# Patient Record
Sex: Female | Born: 1935 | Race: Asian | Marital: Married | Smoking: Never smoker
Health system: Southern US, Community
[De-identification: ages and names within clinical notes are randomized; demographics above are authoritative.]

## PROBLEM LIST (undated history)

## (undated) DIAGNOSIS — T7840XA Allergy, unspecified, initial encounter: Secondary | ICD-10-CM

## (undated) DIAGNOSIS — H269 Unspecified cataract: Secondary | ICD-10-CM

## (undated) DIAGNOSIS — H3530 Unspecified macular degeneration: Secondary | ICD-10-CM

## (undated) DIAGNOSIS — Z973 Presence of spectacles and contact lenses: Secondary | ICD-10-CM

## (undated) DIAGNOSIS — E7800 Pure hypercholesterolemia, unspecified: Secondary | ICD-10-CM

## (undated) DIAGNOSIS — R519 Headache, unspecified: Secondary | ICD-10-CM

## (undated) DIAGNOSIS — H5789 Other specified disorders of eye and adnexa: Secondary | ICD-10-CM

## (undated) DIAGNOSIS — E785 Hyperlipidemia, unspecified: Secondary | ICD-10-CM

## (undated) DIAGNOSIS — R51 Headache: Secondary | ICD-10-CM

## (undated) HISTORY — PX: CATARACT EXTRACTION: SUR2

## (undated) HISTORY — DX: Unspecified macular degeneration: H35.30

## (undated) HISTORY — PX: DILATION AND CURETTAGE OF UTERUS: SHX78

## (undated) HISTORY — PX: YAG LASER APPLICATION: SHX6189

## (undated) HISTORY — DX: Unspecified cataract: H26.9

## (undated) HISTORY — PX: EYE SURGERY: SHX253

## (undated) HISTORY — DX: Hyperlipidemia, unspecified: E78.5

## (undated) HISTORY — DX: Headache: R51

---

## 2005-11-24 ENCOUNTER — Ambulatory Visit: Payer: Self-pay | Admitting: Internal Medicine

## 2006-06-12 ENCOUNTER — Ambulatory Visit: Payer: Self-pay | Admitting: Internal Medicine

## 2006-06-25 ENCOUNTER — Ambulatory Visit: Payer: Self-pay | Admitting: Internal Medicine

## 2006-06-29 ENCOUNTER — Encounter: Admission: RE | Admit: 2006-06-29 | Discharge: 2006-06-29 | Payer: Self-pay | Admitting: Internal Medicine

## 2006-07-07 ENCOUNTER — Encounter: Admission: RE | Admit: 2006-07-07 | Discharge: 2006-07-07 | Payer: Self-pay | Admitting: Internal Medicine

## 2006-07-09 ENCOUNTER — Ambulatory Visit: Payer: Self-pay | Admitting: Internal Medicine

## 2006-07-13 ENCOUNTER — Ambulatory Visit: Payer: Self-pay | Admitting: Cardiology

## 2007-01-22 DIAGNOSIS — O039 Complete or unspecified spontaneous abortion without complication: Secondary | ICD-10-CM | POA: Insufficient documentation

## 2007-02-16 ENCOUNTER — Ambulatory Visit: Payer: Self-pay | Admitting: Internal Medicine

## 2007-02-16 LAB — CONVERTED CEMR LAB
ALT: 19 units/L (ref 0–40)
Chloride: 103 meq/L (ref 96–112)
Cholesterol: 200 mg/dL (ref 0–200)
GFR calc Af Amer: 128 mL/min
GFR calc non Af Amer: 106 mL/min
Glucose, Bld: 95 mg/dL (ref 70–99)
HDL: 53 mg/dL (ref >39.0)
LDL Cholesterol: 131 mg/dL — ABNORMAL HIGH (ref 0–99)
Potassium: 3.5 meq/L (ref 3.5–5.1)
Sodium: 139 meq/L (ref 135–145)
Total CHOL/HDL Ratio: 3.8
Triglycerides: 80 mg/dL (ref 0–149)
VLDL: 16 mg/dL (ref 0–40)

## 2007-03-01 DIAGNOSIS — K219 Gastro-esophageal reflux disease without esophagitis: Secondary | ICD-10-CM | POA: Insufficient documentation

## 2007-03-01 DIAGNOSIS — E785 Hyperlipidemia, unspecified: Secondary | ICD-10-CM | POA: Insufficient documentation

## 2007-03-17 ENCOUNTER — Encounter: Payer: Self-pay | Admitting: Internal Medicine

## 2007-03-22 ENCOUNTER — Telehealth: Payer: Self-pay | Admitting: Internal Medicine

## 2011-10-14 HISTORY — PX: EYE SURGERY: SHX253

## 2013-08-01 ENCOUNTER — Ambulatory Visit: Payer: Medicare Other | Admitting: Obstetrics & Gynecology

## 2013-08-01 ENCOUNTER — Encounter: Payer: Self-pay | Admitting: Obstetrics & Gynecology

## 2013-08-01 VITALS — BP 145/90 | Wt 135.0 lb

## 2013-08-01 MED ORDER — ESTROGENS, CONJUGATED 0.625 MG/GM VA CREA
TOPICAL_CREAM | Freq: Every day | VAGINAL | Status: DC
Start: 2013-08-01 — End: 2014-04-20

## 2013-08-01 NOTE — Progress Notes (Signed)
Subjective:      Alicia Massey is a 77 y.o. female  [phone] who presents for annual exam. The patient has no complaints today. The patient is not sexually active. GYN screening history: last pap: was normal. The patient is not taking hormone replacement therapy. Patient denies post-menopausal vaginal bleeding.. The patient wears seatbelts: yes. The patient participates in regular exercise: yes. The patient reports that there is not domestic violence in her life.   Last colonoscopy was 2013.     OB History     Grav Para Term Preterm Abortions TAB SAB Ect Mult Living    3 3 3       3         Gynecologic History  No LMP recorded. Patient is postmenopausal.  Menstrual History:  STD's:    Last Pap: 30 years. Results were: normal  Last mammogram: 30 years ago. Results were: normal    Past Medical History   Diagnosis Date   . Hyperlipidemia    . [id number](914.7)        Past Surgical History   Procedure Date   . Dilation and curettage of uterus        No current outpatient prescriptions on file prior to visit.       No Known Allergies    History     Social History   . Marital Status: Married     Spouse Name: N/A     Number of Children: N/A   . Years of Education: N/A     Occupational History   . Not on file.     Social History Main Topics   . Smoking status: Never Smoker    . Smokeless tobacco: Never Used   . Alcohol Use: No   . Drug Use: No   . Sexually Active: Yes -- Female partner(s)     Birth Control/ Protection: None     Other Topics Concern   . Not on file     Social History Narrative   . No narrative on file       Family History   Problem Relation Age of Onset   . Stroke Father    . Hypertension Father        The following portions of the patient's history were reviewed and updated as appropriate: allergies, current medications, past family history, past medical history, past social history, past surgical history and problem list.    Review of Systems  A comprehensive review of systems was negative.      Objective:      BP  145/90  Wt 61.236 kg (135 lb)          General appearance: alert, appears stated age and cooperative   Back:   No CVA tenderness.   Lungs: clear to auscultation bilaterally and normal percussion bilaterally   Breasts: normal appearance, no masses or tenderness, No nipple discharge or                     bleeding, No axillary or supraclavicular adenopathy, Normal to palpation            without dominant masses   Heart: regular rate and rhythm   Abdomen: normal findings: bowel sounds normal, liver span normal to         percussion, no masses palpable, no organomegaly and soft, non-tender   Pelvic: cervix normal in appearance, external genitalia normal, no adnexal         masses or tenderness, no  cervical motion tenderness, uterus normal        size, shape, and consistency and vagina normal without discharge   Extremities: extremities normal, atraumatic, no cyanosis or edema and Homans        sign is  negative, no sign of DVT    Assessment:      Normal gyn exam  Menopause    atropic vaginitis  Plan:      All questions answered.  Follow up in 1 year.  Mammogram.  Thin prep Pap smear.    Rx premarin cream PRN

## 2013-08-01 NOTE — Addendum Note (Signed)
Addended by: Kristopher Glee on: 08/01/2013 03:31 PM     Modules accepted: Orders

## 2013-08-03 LAB — LIQUID-BASED PAP SMEAR, SCREENING: .: 0

## 2013-08-04 ENCOUNTER — Encounter: Payer: Self-pay | Admitting: Obstetrics & Gynecology

## 2013-11-05 DIAGNOSIS — R310 Gross hematuria: Secondary | ICD-10-CM | POA: Diagnosis not present

## 2013-11-05 DIAGNOSIS — R3129 Other microscopic hematuria: Secondary | ICD-10-CM | POA: Diagnosis not present

## 2013-11-05 DIAGNOSIS — N362 Urethral caruncle: Secondary | ICD-10-CM | POA: Diagnosis not present

## 2014-03-04 ENCOUNTER — Encounter: Payer: Self-pay | Admitting: Obstetrics & Gynecology

## 2014-03-14 DIAGNOSIS — H43399 Other vitreous opacities, unspecified eye: Secondary | ICD-10-CM | POA: Diagnosis not present

## 2014-03-14 DIAGNOSIS — H4011X0 Primary open-angle glaucoma, stage unspecified: Secondary | ICD-10-CM | POA: Diagnosis not present

## 2014-03-14 DIAGNOSIS — H26499 Other secondary cataract, unspecified eye: Secondary | ICD-10-CM | POA: Diagnosis not present

## 2014-04-20 ENCOUNTER — Telehealth: Payer: Self-pay | Admitting: Obstetrics & Gynecology

## 2014-04-20 DIAGNOSIS — R3129 Other microscopic hematuria: Secondary | ICD-10-CM | POA: Diagnosis not present

## 2014-04-20 DIAGNOSIS — N362 Urethral caruncle: Secondary | ICD-10-CM | POA: Diagnosis not present

## 2014-04-20 MED ORDER — ESTROGENS, CONJUGATED 0.625 MG/GM VA CREA
TOPICAL_CREAM | VAGINAL | Status: AC
Start: 2014-04-20 — End: ?

## 2014-04-20 NOTE — Telephone Encounter (Signed)
-----   Message from Angela Nevin sent at 04/20/2014  2:03 PM EDT -----  She lost her prescription of premarin   [facility] [email or web address]

## 2014-12-26 DIAGNOSIS — R319 Hematuria, unspecified: Secondary | ICD-10-CM | POA: Diagnosis not present

## 2014-12-26 DIAGNOSIS — E780 Pure hypercholesterolemia: Secondary | ICD-10-CM | POA: Diagnosis not present

## 2014-12-26 DIAGNOSIS — Z0000 Encounter for general adult medical examination without abnormal findings: Secondary | ICD-10-CM | POA: Diagnosis not present

## 2014-12-26 DIAGNOSIS — K210 Gastro-esophageal reflux disease with esophagitis: Secondary | ICD-10-CM | POA: Diagnosis not present

## 2014-12-26 DIAGNOSIS — R634 Abnormal weight loss: Secondary | ICD-10-CM | POA: Diagnosis not present

## 2014-12-28 DIAGNOSIS — K769 Liver disease, unspecified: Secondary | ICD-10-CM | POA: Diagnosis not present

## 2014-12-28 DIAGNOSIS — H3500 Unspecified background retinopathy: Secondary | ICD-10-CM | POA: Diagnosis not present

## 2014-12-28 DIAGNOSIS — R109 Unspecified abdominal pain: Secondary | ICD-10-CM | POA: Diagnosis not present

## 2014-12-28 DIAGNOSIS — M810 Age-related osteoporosis without current pathological fracture: Secondary | ICD-10-CM | POA: Diagnosis not present

## 2014-12-28 DIAGNOSIS — H4010X2 Unspecified open-angle glaucoma, moderate stage: Secondary | ICD-10-CM | POA: Diagnosis not present

## 2014-12-29 DIAGNOSIS — R1013 Epigastric pain: Secondary | ICD-10-CM | POA: Diagnosis not present

## 2014-12-29 DIAGNOSIS — K2970 Gastritis, unspecified, without bleeding: Secondary | ICD-10-CM | POA: Diagnosis not present

## 2014-12-29 DIAGNOSIS — K225 Diverticulum of esophagus, acquired: Secondary | ICD-10-CM | POA: Diagnosis not present

## 2014-12-29 DIAGNOSIS — K2950 Unspecified chronic gastritis without bleeding: Secondary | ICD-10-CM | POA: Diagnosis not present

## 2014-12-29 DIAGNOSIS — K2940 Chronic atrophic gastritis without bleeding: Secondary | ICD-10-CM | POA: Diagnosis not present

## 2014-12-29 DIAGNOSIS — R1033 Periumbilical pain: Secondary | ICD-10-CM | POA: Diagnosis not present

## 2015-01-10 DIAGNOSIS — E780 Pure hypercholesterolemia: Secondary | ICD-10-CM | POA: Diagnosis not present

## 2015-01-10 DIAGNOSIS — K210 Gastro-esophageal reflux disease with esophagitis: Secondary | ICD-10-CM | POA: Diagnosis not present

## 2015-01-10 DIAGNOSIS — R634 Abnormal weight loss: Secondary | ICD-10-CM | POA: Diagnosis not present

## 2015-02-13 DIAGNOSIS — K210 Gastro-esophageal reflux disease with esophagitis: Secondary | ICD-10-CM | POA: Diagnosis not present

## 2015-02-13 DIAGNOSIS — H9311 Tinnitus, right ear: Secondary | ICD-10-CM | POA: Diagnosis not present

## 2015-02-13 DIAGNOSIS — J309 Allergic rhinitis, unspecified: Secondary | ICD-10-CM | POA: Diagnosis not present

## 2015-07-03 DIAGNOSIS — R21 Rash and other nonspecific skin eruption: Secondary | ICD-10-CM | POA: Diagnosis not present

## 2015-07-03 DIAGNOSIS — E785 Hyperlipidemia, unspecified: Secondary | ICD-10-CM | POA: Diagnosis not present

## 2015-07-03 DIAGNOSIS — Z0000 Encounter for general adult medical examination without abnormal findings: Secondary | ICD-10-CM | POA: Diagnosis not present

## 2015-07-03 DIAGNOSIS — M810 Age-related osteoporosis without current pathological fracture: Secondary | ICD-10-CM | POA: Diagnosis not present

## 2015-07-04 DIAGNOSIS — R21 Rash and other nonspecific skin eruption: Secondary | ICD-10-CM | POA: Diagnosis not present

## 2015-07-31 DIAGNOSIS — N390 Urinary tract infection, site not specified: Secondary | ICD-10-CM | POA: Diagnosis not present

## 2015-07-31 DIAGNOSIS — M810 Age-related osteoporosis without current pathological fracture: Secondary | ICD-10-CM | POA: Diagnosis not present

## 2015-07-31 DIAGNOSIS — E559 Vitamin D deficiency, unspecified: Secondary | ICD-10-CM | POA: Diagnosis not present

## 2015-07-31 DIAGNOSIS — Z79899 Other long term (current) drug therapy: Secondary | ICD-10-CM | POA: Diagnosis not present

## 2015-07-31 DIAGNOSIS — E785 Hyperlipidemia, unspecified: Secondary | ICD-10-CM | POA: Diagnosis not present

## 2015-07-31 DIAGNOSIS — E539 Vitamin B deficiency, unspecified: Secondary | ICD-10-CM | POA: Diagnosis not present

## 2015-08-03 DIAGNOSIS — M810 Age-related osteoporosis without current pathological fracture: Secondary | ICD-10-CM | POA: Diagnosis not present

## 2015-08-03 DIAGNOSIS — E785 Hyperlipidemia, unspecified: Secondary | ICD-10-CM | POA: Diagnosis not present

## 2015-08-03 DIAGNOSIS — Z0000 Encounter for general adult medical examination without abnormal findings: Secondary | ICD-10-CM | POA: Diagnosis not present

## 2015-11-26 DIAGNOSIS — E7800 Pure hypercholesterolemia, unspecified: Secondary | ICD-10-CM | POA: Diagnosis not present

## 2015-11-26 DIAGNOSIS — K210 Gastro-esophageal reflux disease with esophagitis: Secondary | ICD-10-CM | POA: Diagnosis not present

## 2015-11-26 DIAGNOSIS — R319 Hematuria, unspecified: Secondary | ICD-10-CM | POA: Diagnosis not present

## 2015-12-13 DIAGNOSIS — N3281 Overactive bladder: Secondary | ICD-10-CM | POA: Diagnosis not present

## 2015-12-13 DIAGNOSIS — N390 Urinary tract infection, site not specified: Secondary | ICD-10-CM | POA: Diagnosis not present

## 2015-12-13 DIAGNOSIS — R319 Hematuria, unspecified: Secondary | ICD-10-CM | POA: Diagnosis not present

## 2016-05-06 DIAGNOSIS — M810 Age-related osteoporosis without current pathological fracture: Secondary | ICD-10-CM | POA: Diagnosis not present

## 2016-05-06 DIAGNOSIS — K210 Gastro-esophageal reflux disease with esophagitis: Secondary | ICD-10-CM | POA: Diagnosis not present

## 2016-05-06 DIAGNOSIS — H04121 Dry eye syndrome of right lacrimal gland: Secondary | ICD-10-CM | POA: Diagnosis not present

## 2016-05-06 DIAGNOSIS — E7800 Pure hypercholesterolemia, unspecified: Secondary | ICD-10-CM | POA: Diagnosis not present

## 2016-05-06 DIAGNOSIS — H401132 Primary open-angle glaucoma, bilateral, moderate stage: Secondary | ICD-10-CM | POA: Diagnosis not present

## 2016-05-22 DIAGNOSIS — H04122 Dry eye syndrome of left lacrimal gland: Secondary | ICD-10-CM | POA: Diagnosis not present

## 2016-07-27 ENCOUNTER — Encounter: Payer: Self-pay | Admitting: *Deleted

## 2016-10-24 DIAGNOSIS — H9311 Tinnitus, right ear: Secondary | ICD-10-CM | POA: Diagnosis not present

## 2016-10-24 DIAGNOSIS — E785 Hyperlipidemia, unspecified: Secondary | ICD-10-CM | POA: Diagnosis not present

## 2016-10-24 DIAGNOSIS — R51 Headache: Secondary | ICD-10-CM | POA: Diagnosis not present

## 2016-10-24 DIAGNOSIS — Z79899 Other long term (current) drug therapy: Secondary | ICD-10-CM | POA: Diagnosis not present

## 2016-10-24 DIAGNOSIS — Z6822 Body mass index (BMI) 22.0-22.9, adult: Secondary | ICD-10-CM | POA: Diagnosis not present

## 2016-10-24 DIAGNOSIS — Z0000 Encounter for general adult medical examination without abnormal findings: Secondary | ICD-10-CM | POA: Diagnosis not present

## 2017-04-25 DIAGNOSIS — H40013 Open angle with borderline findings, low risk, bilateral: Secondary | ICD-10-CM | POA: Diagnosis not present

## 2017-04-27 DIAGNOSIS — H40013 Open angle with borderline findings, low risk, bilateral: Secondary | ICD-10-CM | POA: Diagnosis not present

## 2017-04-27 DIAGNOSIS — K210 Gastro-esophageal reflux disease with esophagitis: Secondary | ICD-10-CM | POA: Diagnosis not present

## 2017-04-27 DIAGNOSIS — E7800 Pure hypercholesterolemia, unspecified: Secondary | ICD-10-CM | POA: Diagnosis not present

## 2017-04-27 DIAGNOSIS — M810 Age-related osteoporosis without current pathological fracture: Secondary | ICD-10-CM | POA: Diagnosis not present

## 2017-04-30 DIAGNOSIS — K2970 Gastritis, unspecified, without bleeding: Secondary | ICD-10-CM | POA: Diagnosis not present

## 2017-04-30 DIAGNOSIS — K2950 Unspecified chronic gastritis without bleeding: Secondary | ICD-10-CM | POA: Diagnosis not present

## 2017-04-30 DIAGNOSIS — K2940 Chronic atrophic gastritis without bleeding: Secondary | ICD-10-CM | POA: Diagnosis not present

## 2017-04-30 DIAGNOSIS — M810 Age-related osteoporosis without current pathological fracture: Secondary | ICD-10-CM | POA: Diagnosis not present

## 2017-04-30 DIAGNOSIS — K3189 Other diseases of stomach and duodenum: Secondary | ICD-10-CM | POA: Diagnosis not present

## 2017-07-04 ENCOUNTER — Encounter: Payer: Self-pay | Admitting: *Deleted

## 2017-07-04 DIAGNOSIS — Z23 Encounter for immunization: Secondary | ICD-10-CM

## 2017-07-04 NOTE — Congregational Nurse Program (Unsigned)
Congregational Nurse Program Note  Date of Encounter: 07/04/2017  Past Medical History: No past medical history on file.  Encounter Details:     CNP Questionnaire - 07/04/17 0847      Patient Demographics   Is this a new or existing patient? New   Patient is considered a/an Immigrant   Race Asian     Patient Assistance   Location of Patient Assistance Not Applicable   Patient's financial/insurance status Medicare   Uninsured Patient ([employer] trade [employer]) No   Patient referred to apply for the following financial assistance Not Applicable   Food insecurities addressed Not Applicable   Transportation assistance No   Assistance securing medications No   Educational health offerings Nutrition;Spiritual care     Encounter Details   Primary purpose of visit Navigating the Healthcare System  CAME FOR FLU SHOT, THEN C/O HEARTBURN   Was an Emergency Department visit averted? Not Applicable   Does patient have a medical provider? Yes   Patient referred to --  FAMILY DR   Was a mental health screening completed? (GAINS tool) No   Does patient have dental issues? No   Does patient have vision issues? No   Does your patient have an abnormal blood pressure today? No   Since previous encounter, have you referred patient for abnormal blood pressure that resulted in a new diagnosis or medication change? No   Does your patient have an abnormal blood glucose today? No   Since previous encounter, have you referred patient for abnormal blood glucose that resulted in a new diagnosis or medication change? No   Was there a life-saving intervention made? No     pt. Came for flu shot clinic, c/o heartburn. She has primary Dr. Ernie Avena to check with her Dr.

## 2017-09-01 DIAGNOSIS — H26491 Other secondary cataract, right eye: Secondary | ICD-10-CM | POA: Diagnosis not present

## 2017-09-01 DIAGNOSIS — H26492 Other secondary cataract, left eye: Secondary | ICD-10-CM | POA: Diagnosis not present

## 2017-09-01 DIAGNOSIS — H353211 Exudative age-related macular degeneration, right eye, with active choroidal neovascularization: Secondary | ICD-10-CM | POA: Diagnosis not present

## 2017-09-01 DIAGNOSIS — H353123 Nonexudative age-related macular degeneration, left eye, advanced atrophic without subfoveal involvement: Secondary | ICD-10-CM | POA: Diagnosis not present

## 2017-09-11 NOTE — Progress Notes (Signed)
[facility] - Clinic Note  09/14/2017     CHIEF COMPLAINT Patient presents for Retina Evaluation   HISTORY OF PRESENT ILLNESS: Lindsay Harvey is a 81 y.o. female who presents to the clinic today for:   HPI    Retina Evaluation    In both eyes.  Associated Symptoms Photophobia and Pain.  Negative for Flashes, Trauma, Fever, Floaters, Redness, Scalp Tenderness, Weight Loss, Fatigue, Jaw Claudication, Distortion, Blind Spot, Glare and Shoulder/Hip pain.  Context:  distance vision, mid-range vision and near vision.  Treatments tried include eye drops.  Response to treatment was no improvement.  I, the attending physician,  performed the HPI with the patient and updated documentation appropriately.          Comments    Referral Dr. Elmer PickerHecker for AMD Genella RifeEval Ou. Patient states eyes feel dry and irrigating after Cat sx. 2013. Right eye has occasional  Pain.  Patient wears sun glasses due to photosensitivity  Pt use refresh QID/PRN. Takes AREDs2 qd. Interpretor S. Zoila Shutterosenberg and daughter at patient side        Last edited by Rennis ChrisZamora, Cassity Christian, MD on 09/14/2017  4:05 PM. (History)      Referring physician: Dimitri PedWeaver, Christopher D, MD [address] [place], [place] [id number]  HISTORICAL INFORMATION:   Selected notes from the MEDICAL RECORD NUMBER Referred by Dr. Marcha Solders. [name] for concern of AMD OU, CNV;  LEE- 11.20.18 (C. [name]) [BCVA OD: 20/40 OS 20/30] Ocular Hx- DES OU, pseudophakia OU, keratoconjunctivitis sicca OU PMH- high chol.    CURRENT MEDICATIONS: Current Outpatient Medications (Ophthalmic Drugs)  Medication Sig  . carboxymethylcellulose (REFRESH PLUS) 0.5 % SOLN 1 drop 3 (three) times daily as needed.   No current facility-administered medications for this visit.  (Ophthalmic Drugs)   Current Outpatient Medications (Other)  Medication Sig  . calcium-vitamin D 250-100 MG-UNIT tablet Take 1 tablet by mouth 2 (two) times daily.  . Multiple Vitamins-Minerals  (PRESERVISION AREDS 2 PO) Take by mouth.  . simvastatin (ZOCOR) 40 MG tablet Take 40 mg by mouth daily.   Current Facility-Administered Medications (Other)  Medication Route  . Bevacizumab (AVASTIN) SOLN 1.25 mg Intravitreal      REVIEW OF SYSTEMS: ROS    Positive for: Eyes   Negative for: Constitutional, Gastrointestinal, Neurological, Skin, Genitourinary, Musculoskeletal, HENT, Endocrine, Cardiovascular, Respiratory, Psychiatric, Allergic/Imm, Heme/Lymph   Last edited by Eldridge ScotKendrick, Glenda, LPN on 60/4/540912/12/2016  2:34 PM. (History)    Through interp pt states that she was seen by Dr. Alben SpittleWeaver last month, pt states that she noticed trouble with her vision years ago, states that she was looking for a white paper and noticed the paper was red; Pt states that she no longer sees red out of OU; Pt reports that when she closes OU she sees a type of floater; Pt feels that OU VA has been getting worse x July; Pt reports that she has never been told that she has macular degeneration;    ALLERGIES No Known Allergies  PAST MEDICAL HISTORY Past Medical History:  Diagnosis Date  . Cataract 2013   OU   Past Surgical History:  Procedure Laterality Date  . CATARACT EXTRACTION     2013  . DILATION AND CURETTAGE OF UTERUS    . EYE SURGERY      FAMILY HISTORY Family History  Problem Relation Age of Onset  . Stroke Father     SOCIAL HISTORY Social History   Tobacco Use  .  Smoking status: Never Smoker  . Smokeless tobacco: Never Used  Substance Use Topics  . Alcohol use: No    Frequency: Never  . Drug use: No         OPHTHALMIC EXAM:  Base Eye Exam    Visual Acuity (Snellen - Linear)      Right Left   Dist cc 20/60 20/30   Dist ph cc 20/40 20/30   Correction:  Glasses       Tonometry (Tonopen, 3:13 PM)      Right Left   Pressure 13 17       Pupils      Dark Shape APD   Right 3.5 Round None   Left 3.5 Round None       Visual Fields (Counting fingers)      Left Right     Full Full       Extraocular Movement      Right Left    Full, Ortho Full, Ortho       Neuro/Psych    Oriented x3:  Yes   Mood/Affect:  Normal       Dilation    Both eyes:  1.0% Mydriacyl, 2.5% Phenylephrine @ 3:13 PM        Slit Lamp and Fundus Exam    Slit Lamp Exam      Right Left   Lids/Lashes Dermatochalasis - upper lid Dermatochalasis - upper lid   Conjunctiva/Sclera White and quiet White and quiet   Cornea Arcus, otherwise clear Arcus   Anterior Chamber Deep and quiet Deep and quiet   Iris Round and dilated Round and dilated   Lens PC IOL in good postion, with anterior capsul fimosis, , 1+ Posterior capsular opacification PC IOL in good postion, with anterior capsul fimosis, , 2+ Posterior capsular opacification   Vitreous syneresis Vitreous syneresis       Fundus Exam      Right Left   Disc Normal sharp rim, cupping   C/D Ratio 0.5 0.6   Macula + Edema, + heme, drusen, mild ERM, hemorrhage inferotemporal to fovea Flat, Drusen, early Atrophy, RPE mottling and clumping   Vessels Vascular attenuation Vascular attenuation   Periphery Attached, scattered peripheral drusen Attached with peripheral drusen        Refraction    Wearing Rx      Sphere Cylinder Axis Add   Right -1.00 +0.75 180 +2.75   Left -0.50 +0.75 004 +2.75   Age:  12 years       Manifest Refraction      Sphere Cylinder Axis Dist VA   Right [place] +1.00 180 20/40   Left -0.50 +1.50 006 20/30          IMAGING AND PROCEDURES  Imaging and Procedures for 09/15/17  OCT, Retina - OU - Both Eyes     Right Eye Quality was good. Central Foveal Thickness: 339. Progression has no prior data. Findings include abnormal foveal contour, retinal drusen , subretinal fluid, intraretinal fluid, pigment epithelial detachment, vitreomacular adhesion , vitreous traction.   Left Eye Quality was good. Central Foveal Thickness: 272. Progression has no prior data. Findings include vitreomacular adhesion ,  vitreous traction, pigment epithelial detachment, abnormal foveal contour, no IRF, no SRF.   Notes Images taken, stored on drive  Diagnosis / Impression:  Exudative ARMD OD Nonexudative ARMD OS VMT OU  Clinical management:  See below  Abbreviations: NFP - Normal foveal profile. CME - cystoid macular edema. PED - pigment epithelial  detachment. IRF - intraretinal fluid. SRF - subretinal fluid. EZ - ellipsoid zone. ERM - epiretinal membrane. ORA - outer retinal atrophy. ORT - outer retinal tubulation. SRHM - subretinal hyper-reflective material         Intravitreal Injection, Pharmacologic Agent - OD - Right Eye     Time Out 09/14/2017. 4:52 PM. Confirmed correct patient, procedure, site, and patient consented.   Anesthesia Topical anesthesia was used. Anesthetic medications included Lidocaine 2%, Tetracaine 0.5%.   Procedure Preparation included 5% betadine to ocular surface, eyelid speculum. A supplied needle was used.   Injection: 1.25 mg Bevacizumab 1.25mg /0.2505ml   NDC: 16109-604-5450242-060-01    Lot: [id number]     Expiration Date: 10/21/2017   Route: Intravitreal   Site: Right Eye   Waste: 0 mg  Post-op Post injection exam found visual acuity of at least counting fingers. The patient tolerated the procedure well. There were no complications. The patient received written and verbal post procedure care education.                 ASSESSMENT/PLAN:    ICD-10-CM   1. Exudative age-related macular degeneration of right eye with active choroidal neovascularization (HCC) H35.3211 OCT, Retina - OU - Both Eyes    Intravitreal Injection, Pharmacologic Agent - OD - Right Eye    Bevacizumab (AVASTIN) SOLN 1.25 mg  2. 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 Pseudophakia of both eyes Z96.1     1. 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    - The incidence pathology and anatomy of wet AMD  discussed   - The ANCHOR, MARINA, CATT and VIEW trials discussed with patient.    - discussed treatment options including observation vs intravitreal anti-VEGF agents such as Avastin, Lucentis, Eylea.    - Risks of endophthalmitis and vascular occlusive events and atrophic changes discussed with patient  - intraretinal heme noted on exam  - OCT shows extensive PED with overlying IRF and SRF  - interestingly, VA still pretty good at 20/60 OD  - active CNVM -- recommend IVA OD #1 today, 12.3.18  - pt wishes to be treated with IVA  - RBA of procedure discussed, questions answered  - informed consent obtained and signed  - see procedure note  - f/u in 4 wks  2. Age related macular degeneration, non-exudative, OS  - The incidence, anatomy, and pathology of dry AMD, risk of progression, and the AREDS and AREDS 2 study including smoking risks discussed with patient.  - Recommend amsler grid monitoring  - will continue to monitor for conversion to exudative ARMD  3. Pseudophakia OU  - s/p CE/IOL OU  - beautiful surgery, doing well  - monitor   Ophthalmic Meds Ordered this visit:  Meds ordered this encounter  Medications  . Bevacizumab (AVASTIN) SOLN 1.25 mg       Return in about 4 weeks (around 10/12/2017) for Dilated Exam, OCT, Possible Injxn.  There are no Patient Instructions on file for this visit.   Explained the diagnoses, plan, and follow up with the patient and they expressed understanding.  Patient expressed understanding of the importance of proper follow up care.    Karie ChimeraBrian G. Admir Candelas, M.D., Ph.D. Diseases & Surgery of the Retina and Vitreous [facility] 09/15/17     Abbreviations: M myopia (nearsighted); A astigmatism; H hyperopia (farsighted); P presbyopia; Mrx spectacle prescription;  CTL contact lenses; OD right eye; OS left  eye; OU both eyes  XT exotropia; ET esotropia; PEK punctate epithelial keratitis; PEE punctate epithelial erosions;  DES dry eye syndrome; MGD meibomian gland dysfunction; ATs artificial tears; PFAT's preservative free artificial tears; NSC nuclear sclerotic cataract; PSC posterior subcapsular cataract; ERM epi-retinal membrane; PVD posterior vitreous detachment; RD retinal detachment; DM diabetes mellitus; DR diabetic retinopathy; NPDR non-proliferative diabetic retinopathy; PDR proliferative diabetic retinopathy; CSME clinically significant macular edema; DME diabetic macular edema; dbh dot blot hemorrhages; CWS cotton wool spot; POAG primary open angle glaucoma; C/D cup-to-disc ratio; HVF humphrey visual field; GVF goldmann visual field; OCT optical coherence tomography; IOP intraocular pressure; BRVO Branch retinal vein occlusion; CRVO central retinal vein occlusion; CRAO central retinal artery occlusion; BRAO branch retinal artery occlusion; RT retinal tear; SB scleral buckle; PPV pars plana vitrectomy; VH Vitreous hemorrhage; PRP panretinal laser photocoagulation; IVK intravitreal kenalog; VMT vitreomacular traction; MH Macular hole;  NVD neovascularization of the disc; NVE neovascularization elsewhere; AREDS age related eye disease study; ARMD age related macular degeneration; POAG primary open angle glaucoma; EBMD epithelial/anterior basement membrane dystrophy; ACIOL anterior chamber intraocular lens; IOL intraocular lens; PCIOL posterior chamber intraocular lens; Phaco/IOL phacoemulsification with intraocular lens placement; PRK photorefractive keratectomy; LASIK laser assisted in situ keratomileusis; HTN hypertension; DM diabetes mellitus; COPD chronic obstructive pulmonary disease

## 2017-09-14 ENCOUNTER — Encounter (INDEPENDENT_AMBULATORY_CARE_PROVIDER_SITE_OTHER): Payer: Self-pay | Admitting: Ophthalmology

## 2017-09-14 ENCOUNTER — Ambulatory Visit (INDEPENDENT_AMBULATORY_CARE_PROVIDER_SITE_OTHER): Payer: Medicare HMO | Admitting: Ophthalmology

## 2017-09-14 DIAGNOSIS — Z961 Presence of intraocular lens: Secondary | ICD-10-CM

## 2017-09-14 DIAGNOSIS — H353121 Nonexudative age-related macular degeneration, left eye, early dry stage: Secondary | ICD-10-CM | POA: Diagnosis not present

## 2017-09-14 DIAGNOSIS — H353211 Exudative age-related macular degeneration, right eye, with active choroidal neovascularization: Secondary | ICD-10-CM | POA: Diagnosis not present

## 2017-09-15 ENCOUNTER — Encounter (INDEPENDENT_AMBULATORY_CARE_PROVIDER_SITE_OTHER): Payer: Self-pay | Admitting: Ophthalmology

## 2017-09-15 DIAGNOSIS — H353211 Exudative age-related macular degeneration, right eye, with active choroidal neovascularization: Secondary | ICD-10-CM | POA: Diagnosis not present

## 2017-09-15 DIAGNOSIS — Z961 Presence of intraocular lens: Secondary | ICD-10-CM | POA: Diagnosis not present

## 2017-09-15 DIAGNOSIS — H353121 Nonexudative age-related macular degeneration, left eye, early dry stage: Secondary | ICD-10-CM | POA: Diagnosis not present

## 2017-09-15 MED ORDER — BEVACIZUMAB CHEMO INJECTION 1.25MG/0.05ML SYRINGE FOR KALEIDOSCOPE
1.2500 mg | INTRAVITREAL | Status: AC
  Administered 2017-09-15: 1.25 mg via INTRAVITREAL

## 2017-10-01 NOTE — Progress Notes (Signed)
[facility] - Clinic Note  10/14/2017     CHIEF COMPLAINT Patient presents for Retina Follow Up   HISTORY OF PRESENT ILLNESS: Lindsay Harvey is a 81 y.o. female who presents to the clinic today for:   HPI    Retina Follow Up    Patient presents with  Wet AMD.  In both eyes.  Severity is moderate.  Duration of 4 weeks.  I, the attending physician,  performed the HPI with the patient and updated documentation appropriately.          Comments    Pt presents today for F/U of  EXU ARMD OD and Non-exu ARMD OS, pt reports eyes are dry and hurt all the time, says vision is stable, denies flashes, floaters and wavy vision; pt states she is using Refresh eye drops;       Last edited by Rennis Chris, MD on 10/14/2017  8:21 AM. (History)    Pt states that she has not noticed any change in [place] since having injection; Pt state she continues to have dryness; Pt reports she is using AT OU prn; Pt reports that she is ready tohave another injection today if needed;   Referring physician: Pearson Grippe, MD [address] [place], [place] [address]  HISTORICAL INFORMATION:   Selected notes from the MEDICAL RECORD NUMBER Referred by Dr. Marcha Solders for concern of AMD OU, CNV;  LEE- 11.20.18 (C. [name]) [BCVA OD: 20/40 OS 20/30] Ocular Hx- DES OU, pseudophakia OU, keratoconjunctivitis sicca OU PMH- high chol.    CURRENT MEDICATIONS: Current Outpatient Medications (Ophthalmic Drugs)  Medication Sig  . carboxymethylcellulose (REFRESH PLUS) 0.5 % SOLN 1 drop 3 (three) times daily as needed.   No current facility-administered medications for this visit.  (Ophthalmic Drugs)   Current Outpatient Medications (Other)  Medication Sig  . calcium-vitamin D 250-100 MG-UNIT tablet Take 1 tablet by mouth 2 (two) times daily.  . Multiple Vitamins-Minerals (PRESERVISION AREDS 2 PO) Take by mouth.  . simvastatin (ZOCOR) 40 MG tablet Take 40 mg by mouth daily.   Current  Facility-Administered Medications (Other)  Medication Route  . Bevacizumab (AVASTIN) SOLN 1.25 mg Intravitreal  . Bevacizumab (AVASTIN) SOLN 1.25 mg Intravitreal      REVIEW OF SYSTEMS: ROS    Positive for: Eyes   Negative for: Constitutional, Gastrointestinal, Neurological, Skin, Genitourinary, Musculoskeletal, HENT, Endocrine, Cardiovascular, Respiratory, Psychiatric, Allergic/Imm, Heme/Lymph   Last edited by Posey Boyer, COT on 10/14/2017  8:15 AM. (History)    Through interp pt states    ALLERGIES No Known Allergies  PAST MEDICAL HISTORY Past Medical History:  Diagnosis Date  . Cataract 2013   OU   Past Surgical History:  Procedure Laterality Date  . CATARACT EXTRACTION     2013  . DILATION AND CURETTAGE OF UTERUS    . EYE SURGERY      FAMILY HISTORY Family History  Problem Relation Age of Onset  . Stroke Father     SOCIAL HISTORY Social History   Tobacco Use  . Smoking status: Never Smoker  . Smokeless tobacco: Never Used  Substance Use Topics  . Alcohol use: No    Frequency: Never  . Drug use: No         OPHTHALMIC EXAM:  Base Eye Exam    Visual Acuity (Snellen - Linear)      Right Left   Dist cc 20/50 20/30 -1   Dist ph cc NI NI  Tonometry (Tonopen, 8:27 AM)      Right Left   Pressure 14 10       Pupils      Dark Light Shape React APD   Right 3.5 3 Round Slow None   Left 3.5 3 Round Slow None       Visual Fields (Counting fingers)      Left Right    Full Full       Extraocular Movement      Right Left    Full, Ortho Full, Ortho       Neuro/Psych    Oriented x3:  Yes   Mood/Affect:  Normal       Dilation    Both eyes:  1.0% Mydriacyl, 2.5% Phenylephrine @ 8:27 AM        Slit Lamp and Fundus Exam    Slit Lamp Exam      Right Left   Lids/Lashes Dermatochalasis - upper lid Dermatochalasis - upper lid   Conjunctiva/Sclera White and quiet White and quiet   Cornea Arcus, otherwise clear, 1+ Punctate epithelial  erosions centrally Arcus   Anterior Chamber Deep and quiet Deep and quiet   Iris Round and dilated Round and dilated   Lens PC IOL in good postion with anterior capsular phimosis, 1+ Posterior capsular opacification PC IOL in good postion with anterior capsular phimosis, 2+ Posterior capsular opacification   Vitreous syneresis Vitreous syneresis       Fundus Exam      Right Left   Disc Normal sharp rim, cupping   C/D Ratio 0.5 0.6   Macula Edema improved, + heme, mild ERM, hemorrhage inferotemporal to fovea, Hard drusen, Soft drusen Flat, Drusen, early Atrophy, RPE mottling and clumping, +PEDs, No heme   Vessels Vascular attenuation Vascular attenuation   Periphery Attached, scattered peripheral drusen Attached with peripheral drusen          IMAGING AND PROCEDURES  Imaging and Procedures for 10/14/17  OCT, Retina - OU - Both Eyes     Right Eye Quality was good. Central Foveal Thickness: 242. Progression has improved. Findings include abnormal foveal contour, retinal drusen , intraretinal fluid, pigment epithelial detachment, vitreomacular adhesion , vitreous traction.   Left Eye Quality was good. Central Foveal Thickness: 277. Progression has been stable. Findings include vitreomacular adhesion , vitreous traction, pigment epithelial detachment, abnormal foveal contour, no IRF, no SRF.   Notes Images taken, stored on drive  Diagnosis / Impression:  Exudative ARMD OD -- interval improvement in PED, SRF, IRF Nonexudative ARMD OS VMT OU  Clinical management:  See below  Abbreviations: NFP - Normal foveal profile. CME - cystoid macular edema. PED - pigment epithelial detachment. IRF - intraretinal fluid. SRF - subretinal fluid. EZ - ellipsoid zone. ERM - epiretinal membrane. ORA - outer retinal atrophy. ORT - outer retinal tubulation. SRHM - subretinal hyper-reflective material         Intravitreal Injection, Pharmacologic Agent - OD - Right Eye     Time Out 10/14/2017.  9:23 AM. Confirmed correct patient, procedure, site, and patient consented.   Anesthesia Topical anesthesia was used. Anesthetic medications included Lidocaine 2%, Tetracaine 0.5%.   Procedure Preparation included 5% betadine to ocular surface, 10% betadine to eyelids. A supplied needle was used.   Injection: 1.25 mg Bevacizumab 1.25mg /0.8705ml   NDC: 40981-191-4750242-060-01    Lot: [id number]    Expiration Date: 11/15/2017   Route: Intravitreal   Site: Right Eye   Waste: 0 mg  Post-op Post injection  exam found visual acuity of at least counting fingers. The patient tolerated the procedure well. There were no complications. The patient received written and verbal post procedure care education.                 ASSESSMENT/PLAN:    ICD-10-CM   1. Exudative age-related macular degeneration of right eye with active choroidal neovascularization (HCC) H35.3211 OCT, Retina - OU - Both Eyes    Intravitreal Injection, Pharmacologic Agent - OD - Right Eye    Bevacizumab (AVASTIN) SOLN 1.25 mg  2. 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 Pseudophakia of both eyes Z96.1     1. 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    - The incidence pathology and anatomy of wet AMD discussed   - The ANCHOR, MARINA, CATT and VIEW trials discussed with patient.    - discussed treatment options including observation vs intravitreal anti-VEGF agents such as Avastin, Lucentis, Eylea.    - Risks of endophthalmitis and vascular occlusive events and atrophic changes discussed with patient  - intraretinal heme persists on exam  - S/P IVA OD #1 (12.03.18) -- good response  - OCT shows interval decrease in PED, IRF and SRF  - VA slightly improved to 20/50 from 20/60 OD  - active CNVM -- recommend IVA OD #2 today, 10/14/17  - pt wishes to be treated with IVA  - RBA of procedure discussed, questions answered  - informed  consent obtained and signed  - see procedure note  - f/u in 4 wks  2. Age related macular degeneration, non-exudative, OS  - The incidence, anatomy, and pathology of dry AMD, risk of progression, and the AREDS and AREDS 2 study including smoking risks discussed with patient.  - Recommend amsler grid monitoring  - will continue to monitor for conversion to exudative ARMD  3. Pseudophakia OU  - s/p CE/IOL OU  - beautiful surgery, doing well  - monitor   Ophthalmic Meds Ordered this visit:  Meds ordered this encounter  Medications  . Bevacizumab (AVASTIN) SOLN 1.25 mg       Return in about 4 weeks (around 11/11/2017) for AMD f/u - Dilated Exam, OCT, Possible Injxn.  There are no Patient Instructions on file for this visit.   Explained the diagnoses, plan, and follow up with the patient and they expressed understanding.  Patient expressed understanding of the importance of proper follow up care.   This document serves as a record of services personally performed by Karie ChimeraBrian G. Oberon Hehir, MD, PhD. It was created on their behalf by Virgilio BellingMeredith Fabian, COA, a certified ophthalmic assistant. The creation of this record is the provider's dictation and/or activities during the visit.  Electronically signed by: Virgilio BellingMeredith Fabian, COA  10/14/17 9:24 AM     Karie ChimeraBrian G. Alveena Taira, M.D., Ph.D. Diseases & Surgery of the Retina and Vitreous [facility] 10/14/17  I have reviewed the above documentation for accuracy and completeness, and I agree with the above. Karie ChimeraBrian G. Abdulkareem Badolato, M.D., Ph.D. 10/14/17 9:24 AM     Abbreviations: M myopia (nearsighted); A astigmatism; H hyperopia (farsighted); P presbyopia; Mrx spectacle prescription;  CTL contact lenses; OD right eye; OS left eye; OU both eyes  XT exotropia; ET esotropia; PEK punctate epithelial keratitis; PEE punctate epithelial erosions; DES dry eye syndrome; MGD meibomian gland dysfunction; ATs artificial tears; PFAT's preservative  free artificial tears; NSC nuclear sclerotic cataract; PSC posterior subcapsular  cataract; ERM epi-retinal membrane; PVD posterior vitreous detachment; RD retinal detachment; DM diabetes mellitus; DR diabetic retinopathy; NPDR non-proliferative diabetic retinopathy; PDR proliferative diabetic retinopathy; CSME clinically significant macular edema; DME diabetic macular edema; dbh dot blot hemorrhages; CWS cotton wool spot; POAG primary open angle glaucoma; C/D cup-to-disc ratio; HVF humphrey visual field; GVF goldmann visual field; OCT optical coherence tomography; IOP intraocular pressure; BRVO Branch retinal vein occlusion; CRVO central retinal vein occlusion; CRAO central retinal artery occlusion; BRAO branch retinal artery occlusion; RT retinal tear; SB scleral buckle; PPV pars plana vitrectomy; VH Vitreous hemorrhage; PRP panretinal laser photocoagulation; IVK intravitreal kenalog; VMT vitreomacular traction; MH Macular hole;  NVD neovascularization of the disc; NVE neovascularization elsewhere; AREDS age related eye disease study; ARMD age related macular degeneration; POAG primary open angle glaucoma; EBMD epithelial/anterior basement membrane dystrophy; ACIOL anterior chamber intraocular lens; IOL intraocular lens; PCIOL posterior chamber intraocular lens; Phaco/IOL phacoemulsification with intraocular lens placement; [place] photorefractive keratectomy; LASIK laser assisted in situ keratomileusis; HTN hypertension; DM diabetes mellitus; COPD chronic obstructive pulmonary disease

## 2017-10-14 ENCOUNTER — Ambulatory Visit (INDEPENDENT_AMBULATORY_CARE_PROVIDER_SITE_OTHER): Payer: Medicare HMO | Admitting: Ophthalmology

## 2017-10-14 ENCOUNTER — Encounter (INDEPENDENT_AMBULATORY_CARE_PROVIDER_SITE_OTHER): Payer: Self-pay | Admitting: Ophthalmology

## 2017-10-14 DIAGNOSIS — Z961 Presence of intraocular lens: Secondary | ICD-10-CM | POA: Diagnosis not present

## 2017-10-14 DIAGNOSIS — H353211 Exudative age-related macular degeneration, right eye, with active choroidal neovascularization: Secondary | ICD-10-CM

## 2017-10-14 DIAGNOSIS — H353121 Nonexudative age-related macular degeneration, left eye, early dry stage: Secondary | ICD-10-CM | POA: Diagnosis not present

## 2017-10-14 MED ORDER — BEVACIZUMAB CHEMO INJECTION 1.25MG/0.05ML SYRINGE FOR KALEIDOSCOPE
1.2500 mg | INTRAVITREAL | Status: AC
  Administered 2017-10-14: 1.25 mg via INTRAVITREAL

## 2017-11-09 NOTE — Progress Notes (Signed)
[facility] - Clinic Note  11/11/2017     CHIEF COMPLAINT Patient presents for Flashes/floaters and Retina Follow Up   HISTORY OF PRESENT ILLNESS: Lindsay Harvey is a 82 y.o. female who presents to the clinic today for:   HPI    Flashes/floaters    In both eyes.  This started 2 weeks ago.  Duration Intermittant.  Since onset it is stable.  Associated Symptoms Floaters and Photophobia.  Negative for Flashes, Pain, Trauma, Fever, Weight Loss, Scalp Tenderness, Redness, Distortion, Jaw Claudication, Fatigue, Shoulder/Hip pain, Glare and Blind Spot.  Context:  distance vision and mid-range vision.  Treatments tried include injection.  Response to treatment was mild improvement.  I, the attending physician,  performed the HPI with the patient and updated documentation appropriately.          Retina Follow Up    In right eye.  Severity is mild.  Duration of 3 months.  Since onset it is stable.  I, the attending physician,  performed the HPI with the patient and updated documentation appropriately.          Comments    F/U AMD. Patient states her "vision is the same". Patient reports a new on set of floaters Ou. Patient reports she occasionally has ocular pain and she applies cool compresses for relief. Pt has occasional light sensitivity when she has ocular pain.    Patient rates pain 5 ( on scale 1-10). Pt  Reports she is ready to move forward with Avastin today. Patient uses dry eye gtts prn and take multivitamin qd       Last edited by Rennis Chris, MD on 11/11/2017  8:26 AM. (History)      Referring physician: Pearson Grippe, MD [address] [place], [place] [address]  HISTORICAL INFORMATION:   Selected notes from the MEDICAL RECORD NUMBER Referred by Dr. Marcha Solders for concern of AMD OU, CNV;  LEE- 11.20.18 (C. [name]) [BCVA OD: 20/40 OS 20/30] Ocular Hx- DES OU, pseudophakia OU, keratoconjunctivitis sicca OU PMH- high chol.    CURRENT  MEDICATIONS: Current Outpatient Medications (Ophthalmic Drugs)  Medication Sig  . carboxymethylcellulose (REFRESH PLUS) 0.5 % SOLN 1 drop 3 (three) times daily as needed.   No current facility-administered medications for this visit.  (Ophthalmic Drugs)   Current Outpatient Medications (Other)  Medication Sig  . calcium-vitamin D 250-100 MG-UNIT tablet Take 1 tablet by mouth 2 (two) times daily.  . Multiple Vitamins-Minerals (PRESERVISION AREDS 2 PO) Take by mouth.  . simvastatin (ZOCOR) 40 MG tablet Take 40 mg by mouth daily.   Current Facility-Administered Medications (Other)  Medication Route  . Bevacizumab (AVASTIN) SOLN 1.25 mg Intravitreal  . Bevacizumab (AVASTIN) SOLN 1.25 mg Intravitreal  . Bevacizumab (AVASTIN) SOLN 1.25 mg Intravitreal      REVIEW OF SYSTEMS: ROS    Positive for: Eyes   Negative for: Constitutional, Gastrointestinal, Neurological, Skin, Genitourinary, Musculoskeletal, HENT, Endocrine, Cardiovascular, Respiratory, Psychiatric, Allergic/Imm, Heme/Lymph   Last edited by Eldridge Scot, LPN on 03/16/5408  8:18 AM. (History)    Through interp pt states    ALLERGIES No Known Allergies  PAST MEDICAL HISTORY Past Medical History:  Diagnosis Date  . Cataract 2013   OU   Past Surgical History:  Procedure Laterality Date  . CATARACT EXTRACTION     2013  . DILATION AND CURETTAGE OF UTERUS    . EYE SURGERY      FAMILY HISTORY Family History  Problem Relation  Age of Onset  . Stroke Father     SOCIAL HISTORY Social History   Tobacco Use  . Smoking status: Never Smoker  . Smokeless tobacco: Never Used  Substance Use Topics  . Alcohol use: No    Frequency: Never  . Drug use: No         OPHTHALMIC EXAM:  Base Eye Exam    Visual Acuity (Snellen - Linear)      Right Left   Dist cc 20/50 20/40   Dist ph cc NI 20/30   Correction:  Glasses       Tonometry (Tonopen, 8:16 AM)      Right Left   Pressure 15 13       Pupils       Dark Light Shape React APD   Right 4 3 Round Brisk None   Left 4 3 Round Brisk None       Visual Fields (Counting fingers)      Left Right    Full Full       Extraocular Movement      Right Left    Full, Ortho Full, Ortho       Neuro/Psych    Oriented x3:  Yes   Mood/Affect:  Normal       Dilation    Both eyes:  1.0% Mydriacyl, 2.5% Phenylephrine @ 8:16 AM        Slit Lamp and Fundus Exam    Slit Lamp Exam      Right Left   Lids/Lashes Dermatochalasis - upper lid Dermatochalasis - upper lid   Conjunctiva/Sclera White and quiet White and quiet   Cornea Arcus, 1-2+ Punctate epithelial erosions centrally Arcus, Inferior 1+ Punctate epithelial erosions   Anterior Chamber Deep and quiet Deep and quiet   Iris Round and dilated Round and dilated   Lens PC IOL in good postion with anterior capsular phimosis, 1+ Posterior capsular opacification PC IOL in good postion with anterior capsular phimosis, 2+ Posterior capsular opacification   Vitreous syneresis Vitreous syneresis       Fundus Exam      Right Left   Disc Normal sharp rim, cupping   C/D Ratio 0.5 0.6   Macula Edema improved, + heme, mild ERM, hemorrhage inferotemporal to fovea - improving, Hard drusen, Soft drusen Flat, Drusen, early Atrophy, RPE mottling and clumping, +PEDs, No heme   Vessels Vascular attenuation Vascular attenuation   Periphery Attached, scattered peripheral drusen Attached with peripheral drusen          IMAGING AND PROCEDURES  Imaging and Procedures for 11/11/17  OCT, Retina - OU - Both Eyes     Right Eye Quality was good. Central Foveal Thickness: 252. Progression has improved. Findings include abnormal foveal contour, retinal drusen , intraretinal fluid, pigment epithelial detachment, vitreomacular adhesion , vitreous traction, subretinal fluid.   Left Eye Quality was good. Central Foveal Thickness: 260. Progression has been stable. Findings include vitreomacular adhesion , vitreous  traction, pigment epithelial detachment, abnormal foveal contour, no IRF, no SRF.   Notes Images taken, stored on drive  Diagnosis / Impression:  Exudative ARMD OD -- interval improvement in PED, SRF, IRF Nonexudative ARMD OS VMT OU  Clinical management:  See below  Abbreviations: NFP - Normal foveal profile. CME - cystoid macular edema. PED - pigment epithelial detachment. IRF - intraretinal fluid. SRF - subretinal fluid. EZ - ellipsoid zone. ERM - epiretinal membrane. ORA - outer retinal atrophy. ORT - outer retinal tubulation. SRHM -  subretinal hyper-reflective material         Intravitreal Injection, Pharmacologic Agent - OD - Right Eye     Time Out 11/11/2017. 8:48 AM. Confirmed correct patient, procedure, site, and patient consented.   Anesthesia Topical anesthesia was used. Anesthetic medications included Lidocaine 2%, Tetracaine 0.5%.   Procedure Preparation included 5% betadine to ocular surface, eyelid speculum. A supplied needle was used.   Injection: 1.25 mg Bevacizumab 1.25mg /0.53ml   NDC: 16109-604-54    Lot: [id number]     Expiration Date: 12/06/2017   Route: Intravitreal   Site: Right Eye   Waste: 0 mg  Post-op Post injection exam found visual acuity of at least counting fingers. The patient tolerated the procedure well. There were no complications. The patient received written and verbal post procedure care education.                 ASSESSMENT/PLAN:    ICD-10-CM   1. Exudative age-related macular degeneration of right eye with active choroidal neovascularization (HCC) H35.3211 OCT, Retina - OU - Both Eyes    Intravitreal Injection, Pharmacologic Agent - OD - Right Eye    Bevacizumab (AVASTIN) SOLN 1.25 mg  2. 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 Pseudophakia of both eyes Z96.1   4. 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 Exudative age related macular degeneration with active  choroidal neovascularization OD.    - The incidence pathology and anatomy of wet AMD discussed   - The ANCHOR, MARINA, CATT and VIEW trials discussed with patient.    - discussed treatment options including observation vs intravitreal anti-VEGF agents such as Avastin, Lucentis, Eylea.    - Risks of endophthalmitis and vascular occlusive events and atrophic changes discussed with patient  - S/P IVA OD #1 (12.03.18), #2 (01.02.19)  - has had good response to IVA  - intraretinal heme persists on exam  - OCT today shows interval decrease in PED, IRF and SRF  - VA stable at 20/50 today from 20/60 OD initially  - active CNVM -- recommend IVA OD #3 today, 1.30.19  - pt wishes to be treated with IVA  - RBA of procedure discussed, questions answered  - informed consent obtained and signed  - see procedure note  - f/u in 4 wks  2. Age related macular degeneration, non-exudative, OS  - The incidence, anatomy, and pathology of dry AMD, risk of progression, and the AREDS and AREDS 2 study including smoking risks discussed with patient.  - Recommend amsler grid monitoring  - will continue to monitor for conversion to exudative ARMD  3. Pseudophakia OU  - s/p CE/IOL OU  - beautiful surgery, doing well  - monitor  4. Vitreous syneresis OU  Pt with intermittent floaters OU  No frank PVD on exam  Discussed findings and prognosis  No RT or RD on 360 peripheral exam  Reviewed s/s of RT/RD  Strict return precautions for any such RT/RD signs/symptoms    Ophthalmic Meds Ordered this visit:  Meds ordered this encounter  Medications  . Bevacizumab (AVASTIN) SOLN 1.25 mg       Return in about 4 weeks (around 12/09/2017) for Dilated Exam, OCT, Possible Injxn.  There are no Patient Instructions on file for this visit.   Explained the diagnoses, plan, and follow up with the patient and they expressed understanding.  Patient expressed understanding of the importance of proper follow up care.    This document  serves as a record of services personally performed by Karie Chimera, MD, PhD. It was created on their behalf by Virgilio Belling, COA, a certified ophthalmic assistant. The creation of this record is the provider's dictation and/or activities during the visit.  Electronically signed by: Virgilio Belling, COA  11/11/17 9:02 AM    Karie Chimera, M.D., Ph.D. Diseases & Surgery of the Retina and Vitreous [facility] 11/11/17   I have reviewed the above documentation for accuracy and completeness, and I agree with the above. Karie Chimera, M.D., Ph.D. 11/11/17 9:02 AM     Abbreviations: M myopia (nearsighted); A astigmatism; H hyperopia (farsighted); P presbyopia; Mrx spectacle prescription;  CTL contact lenses; OD right eye; OS left eye; OU both eyes  XT exotropia; ET esotropia; PEK punctate epithelial keratitis; PEE punctate epithelial erosions; DES dry eye syndrome; MGD meibomian gland dysfunction; ATs artificial tears; PFAT's preservative free artificial tears; NSC nuclear sclerotic cataract; PSC posterior subcapsular cataract; ERM epi-retinal membrane; PVD posterior vitreous detachment; RD retinal detachment; DM diabetes mellitus; DR diabetic retinopathy; NPDR non-proliferative diabetic retinopathy; PDR proliferative diabetic retinopathy; CSME clinically significant macular edema; DME diabetic macular edema; dbh dot blot hemorrhages; CWS cotton wool spot; POAG primary open angle glaucoma; C/D cup-to-disc ratio; HVF humphrey visual field; GVF goldmann visual field; OCT optical coherence tomography; IOP intraocular pressure; BRVO Branch retinal vein occlusion; CRVO central retinal vein occlusion; CRAO central retinal artery occlusion; BRAO branch retinal artery occlusion; RT retinal tear; SB scleral buckle; PPV pars plana vitrectomy; VH Vitreous hemorrhage; PRP panretinal laser photocoagulation; IVK intravitreal kenalog; VMT vitreomacular traction; MH  Macular hole;  NVD neovascularization of the disc; NVE neovascularization elsewhere; AREDS age related eye disease study; ARMD age related macular degeneration; POAG primary open angle glaucoma; EBMD epithelial/anterior basement membrane dystrophy; ACIOL anterior chamber intraocular lens; IOL intraocular lens; PCIOL posterior chamber intraocular lens; Phaco/IOL phacoemulsification with intraocular lens placement; PRK photorefractive keratectomy; LASIK laser assisted in situ keratomileusis; HTN hypertension; DM diabetes mellitus; COPD chronic obstructive pulmonary disease

## 2017-11-11 ENCOUNTER — Encounter (INDEPENDENT_AMBULATORY_CARE_PROVIDER_SITE_OTHER): Payer: Self-pay | Admitting: Ophthalmology

## 2017-11-11 ENCOUNTER — Ambulatory Visit (INDEPENDENT_AMBULATORY_CARE_PROVIDER_SITE_OTHER): Payer: Medicare HMO | Admitting: Ophthalmology

## 2017-11-11 DIAGNOSIS — Z961 Presence of intraocular lens: Secondary | ICD-10-CM | POA: Diagnosis not present

## 2017-11-11 DIAGNOSIS — H353211 Exudative age-related macular degeneration, right eye, with active choroidal neovascularization: Secondary | ICD-10-CM | POA: Diagnosis not present

## 2017-11-11 DIAGNOSIS — H353121 Nonexudative age-related macular degeneration, left eye, early dry stage: Secondary | ICD-10-CM

## 2017-11-11 DIAGNOSIS — H43393 Other vitreous opacities, bilateral: Secondary | ICD-10-CM

## 2017-11-11 MED ORDER — BEVACIZUMAB CHEMO INJECTION 1.25MG/0.05ML SYRINGE FOR KALEIDOSCOPE
1.2500 mg | INTRAVITREAL | Status: AC
  Administered 2017-11-11: 1.25 mg via INTRAVITREAL

## 2017-11-12 DIAGNOSIS — E785 Hyperlipidemia, unspecified: Secondary | ICD-10-CM | POA: Diagnosis not present

## 2017-11-12 DIAGNOSIS — R109 Unspecified abdominal pain: Secondary | ICD-10-CM | POA: Diagnosis not present

## 2017-11-12 DIAGNOSIS — N390 Urinary tract infection, site not specified: Secondary | ICD-10-CM | POA: Diagnosis not present

## 2017-11-12 DIAGNOSIS — Z0000 Encounter for general adult medical examination without abnormal findings: Secondary | ICD-10-CM | POA: Diagnosis not present

## 2017-11-12 DIAGNOSIS — R319 Hematuria, unspecified: Secondary | ICD-10-CM | POA: Diagnosis not present

## 2017-11-18 DIAGNOSIS — E039 Hypothyroidism, unspecified: Secondary | ICD-10-CM | POA: Diagnosis not present

## 2017-11-18 DIAGNOSIS — K219 Gastro-esophageal reflux disease without esophagitis: Secondary | ICD-10-CM | POA: Diagnosis not present

## 2017-12-03 DIAGNOSIS — M3501 Sicca syndrome with keratoconjunctivitis: Secondary | ICD-10-CM | POA: Diagnosis not present

## 2017-12-03 DIAGNOSIS — H04123 Dry eye syndrome of bilateral lacrimal glands: Secondary | ICD-10-CM | POA: Diagnosis not present

## 2017-12-10 NOTE — Progress Notes (Signed)
[facility] - Clinic Note  12/11/2017     CHIEF COMPLAINT Patient presents for Retina Follow Up   HISTORY OF PRESENT ILLNESS: Lindsay Harvey is a 82 y.o. female who presents to the clinic today for:   HPI    Retina Follow Up    Patient presents with  Wet AMD.  In right eye.  This started 4 months ago.  Severity is mild.  Since onset it is gradually improving.  I, the attending physician,  performed the HPI with the patient and updated documentation appropriately.          Comments    F/U EXU AMD OD. Patient states her vision is getting better,she continues to have discomfort from dry eyes. Pt reports she is ready for Avastin today. Pt is using Refresh gtts Ou Prn, Dr. Elmer Picker has ordered new gtts Boris Lown (pt will start those today)       Last edited by Rennis Chris, MD on 12/11/2017  8:12 AM. (History)    Pt states she feels OU VA is improving;   Referring physician: Pearson Grippe, MD [address] [place], [place] [address]  HISTORICAL INFORMATION:   Selected notes from the MEDICAL RECORD NUMBER Referred by Dr. Marcha Solders for concern of AMD OU, CNV;  LEE- 11.20.18 (C. [name]) [BCVA OD: 20/40 OS 20/30] Ocular Hx- DES OU, pseudophakia OU, keratoconjunctivitis sicca OU PMH- high chol.    CURRENT MEDICATIONS: Current Outpatient Medications (Ophthalmic Drugs)  Medication Sig  . carboxymethylcellulose (REFRESH PLUS) 0.5 % SOLN 1 drop 3 (three) times daily as needed.   No current facility-administered medications for this visit.  (Ophthalmic Drugs)   Current Outpatient Medications (Other)  Medication Sig  . calcium-vitamin D 250-100 MG-UNIT tablet Take 1 tablet by mouth 2 (two) times daily.  . Multiple Vitamins-Minerals (PRESERVISION AREDS 2 PO) Take by mouth.  . simvastatin (ZOCOR) 40 MG tablet Take 40 mg by mouth daily.   Current Facility-Administered Medications (Other)  Medication Route  . Bevacizumab (AVASTIN) SOLN 1.25 mg Intravitreal   . Bevacizumab (AVASTIN) SOLN 1.25 mg Intravitreal  . Bevacizumab (AVASTIN) SOLN 1.25 mg Intravitreal  . Bevacizumab (AVASTIN) SOLN 1.25 mg Intravitreal      REVIEW OF SYSTEMS: ROS    Positive for: Eyes   Negative for: Constitutional, Gastrointestinal, Neurological, Skin, Genitourinary, Musculoskeletal, HENT, Endocrine, Cardiovascular, Respiratory, Psychiatric, Allergic/Imm, Heme/Lymph   Last edited by Eldridge Scot, LPN on 6/0/4540  8:08 AM. (History)    Through interp pt states    ALLERGIES No Known Allergies  PAST MEDICAL HISTORY Past Medical History:  Diagnosis Date  . Cataract 2013   OU   Past Surgical History:  Procedure Laterality Date  . CATARACT EXTRACTION     2013  . DILATION AND CURETTAGE OF UTERUS    . EYE SURGERY      FAMILY HISTORY Family History  Problem Relation Age of Onset  . Stroke Father     SOCIAL HISTORY Social History   Tobacco Use  . Smoking status: Never Smoker  . Smokeless tobacco: Never Used  Substance Use Topics  . Alcohol use: No    Frequency: Never  . Drug use: No         OPHTHALMIC EXAM:  Base Eye Exam    Visual Acuity (Snellen - Linear)      Right Left   Dist cc 20/40 20/40   Dist ph cc NI NI   Correction:  Glasses  Tonometry (Tonopen, 8:25 AM)      Right Left   Pressure 15 17       Pupils      Dark Light Shape React APD   Right 3 2 Round Brisk None   Left 3 2 Round Brisk None       Visual Fields (Counting fingers)      Left Right    Full Full       Extraocular Movement      Right Left    Full, Ortho Full, Ortho       Neuro/Psych    Oriented x3:  Yes   Mood/Affect:  Normal       Dilation    Both eyes:  1.0% Mydriacyl, Paremyd @ 8:18 AM        Slit Lamp and Fundus Exam    Slit Lamp Exam      Right Left   Lids/Lashes Dermatochalasis - upper lid Dermatochalasis - upper lid   Conjunctiva/Sclera White and quiet White and quiet   Cornea Arcus, 1-2+ Punctate epithelial erosions  centrally Arcus, Inferior 1+ Punctate epithelial erosions   Anterior Chamber Deep and quiet Deep and quiet   Iris Round and dilated Round and dilated   Lens PC IOL in good postion with anterior capsular phimosis, 1+ Posterior capsular opacification PC IOL in good postion with anterior capsular phimosis, 2+ Posterior capsular opacification   Vitreous syneresis Vitreous syneresis       Fundus Exam      Right Left   Disc Normal sharp rim, cupping   C/D Ratio 0.5 0.6   Macula Edema improved, mild ERM, hemorrhage inferotemporal to fovea - improving, Hard drusen inferior and temporal, Soft drusen superior, Retinal pigment epithelial mottling and atrophy centrally Flat, Drusen, early Atrophy, RPE mottling and clumping, +PEDs, No heme   Vessels Vascular attenuation Vascular attenuation   Periphery Attached, scattered peripheral drusen Attached with peripheral drusen          IMAGING AND PROCEDURES  Imaging and Procedures for 12/11/17  OCT, Retina - OU - Both Eyes     Right Eye Quality was good. Central Foveal Thickness: 265. Progression has been stable. Findings include abnormal foveal contour, retinal drusen , intraretinal fluid, pigment epithelial detachment, vitreomacular adhesion , vitreous traction, subretinal fluid.   Left Eye Quality was good. Central Foveal Thickness: 270. Progression has been stable. Findings include vitreomacular adhesion , vitreous traction, pigment epithelial detachment, abnormal foveal contour, no IRF, no SRF.   Notes Images taken, stored on drive  Diagnosis / Impression:  Exudative ARMD OD -- interval improvement in PED, SRF; IRF slightly increased Nonexudative ARMD OS VMT OU  Clinical management:  See below  Abbreviations: NFP - Normal foveal profile. CME - cystoid macular edema. PED - pigment epithelial detachment. IRF - intraretinal fluid. SRF - subretinal fluid. EZ - ellipsoid zone. ERM - epiretinal membrane. ORA - outer retinal atrophy. ORT - outer  retinal tubulation. SRHM - subretinal hyper-reflective material         Intravitreal Injection, Pharmacologic Agent - OD - Right Eye     Time Out 12/11/2017. 8:47 AM. Confirmed correct patient, procedure, site, and patient consented.   Anesthesia Topical anesthesia was used. Anesthetic medications included Lidocaine 2%, Tetracaine 0.5%.   Procedure Preparation included 5% betadine to ocular surface, eyelid speculum. A supplied needle was used.   Injection: 1.25 mg Bevacizumab 1.25mg /0.29ml   NDC: 47829-562-13    Lot: [id number]     Expiration Date: 01/27/2018  Route: Intravitreal   Site: Right Eye   Waste: 0 mg  Post-op Post injection exam found visual acuity of at least counting fingers. The patient tolerated the procedure well. There were no complications. The patient received written and verbal post procedure care education.                 ASSESSMENT/PLAN:    ICD-10-CM   1. Exudative age-related macular degeneration of right eye with active choroidal neovascularization (HCC) H35.3211 OCT, Retina - OU - Both Eyes    Intravitreal Injection, Pharmacologic Agent - OD - Right Eye    Bevacizumab (AVASTIN) SOLN 1.25 mg  2. 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 Pseudophakia of both eyes Z96.1   4. 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    - The incidence pathology and anatomy of wet AMD discussed   - The ANCHOR, MARINA, CATT and VIEW trials discussed with patient.    - discussed treatment options including observation vs intravitreal anti-VEGF agents such as Avastin, Lucentis, Eylea.    - Risks of endophthalmitis and vascular occlusive events and atrophic changes discussed with patient  - S/P IVA OD #1 (12.03.18), #2 (01.02.19), #3 (01.30.19)  - has had good response to IVA  - intraretinal heme persists on exam, but  improving  - OCT today shows interval decrease in PED and SRF; IRF slightly increased  - VA stable at 20/40 today from 20/60 OD initially  - active CNVM -- recommend IVA OD #4 today, 03.01.19  - pt wishes to be treated with IVA  - RBA of procedure discussed, questions answered  - informed consent obtained and signed  - see procedure note  - f/u in 4 wks  2. Age related macular degeneration, non-exudative, OS  - The incidence, anatomy, and pathology of dry AMD, risk of progression, and the AREDS and AREDS 2 study including smoking risks discussed with patient.  - Continue amsler grid monitoring  - will continue to monitor for conversion to exudative ARMD  3. Pseudophakia OU  - s/p CE/IOL OU  - beautiful surgery, doing well  - monitor  4. Vitreous syneresis OU  Pt with intermittent floaters OU  No frank PVD on exam  Discussed findings and prognosis  No RT or RD on 360 peripheral exam  Reviewed s/s of RT/RD  Strict return precautions for any such RT/RD signs/symptoms    Ophthalmic Meds Ordered this visit:  Meds ordered this encounter  Medications  . Bevacizumab (AVASTIN) SOLN 1.25 mg       Return in about 4 weeks (around 01/08/2018) for Dilated Exam, OCT, Possible Injxn.  There are no Patient Instructions on file for this visit.   Explained the diagnoses, plan, and follow up with the patient and they expressed understanding.  Patient expressed understanding of the importance of proper follow up care.   This document serves as a record of services personally performed by Karie Chimera, MD, PhD. It was created on their behalf by Virgilio Belling, COA, a certified ophthalmic assistant. The creation of this record is the provider's dictation and/or activities during the visit.  Electronically signed by: Virgilio Belling, COA  12/11/17 9:20 AM   Karie Chimera, M.D., Ph.D. Diseases & Surgery of the Retina and Vitreous [facility] 12/11/17   I  have reviewed the above documentation for accuracy and  completeness, and I agree with the above. Karie ChimeraBrian G. Ruddy Swire, M.D., Ph.D. 12/11/17 9:25 AM     Abbreviations: M myopia (nearsighted); A astigmatism; H hyperopia (farsighted); P presbyopia; Mrx spectacle prescription;  CTL contact lenses; OD right eye; OS left eye; OU both eyes  XT exotropia; ET esotropia; PEK punctate epithelial keratitis; PEE punctate epithelial erosions; DES dry eye syndrome; MGD meibomian gland dysfunction; ATs artificial tears; PFAT's preservative free artificial tears; NSC nuclear sclerotic cataract; PSC posterior subcapsular cataract; ERM epi-retinal membrane; PVD posterior vitreous detachment; RD retinal detachment; DM diabetes mellitus; DR diabetic retinopathy; NPDR non-proliferative diabetic retinopathy; PDR proliferative diabetic retinopathy; CSME clinically significant macular edema; DME diabetic macular edema; dbh dot blot hemorrhages; CWS cotton wool spot; POAG primary open angle glaucoma; C/D cup-to-disc ratio; HVF humphrey visual field; GVF goldmann visual field; OCT optical coherence tomography; IOP intraocular pressure; BRVO Branch retinal vein occlusion; CRVO central retinal vein occlusion; CRAO central retinal artery occlusion; BRAO branch retinal artery occlusion; RT retinal tear; SB scleral buckle; PPV pars plana vitrectomy; VH Vitreous hemorrhage; PRP panretinal laser photocoagulation; IVK intravitreal kenalog; VMT vitreomacular traction; MH Macular hole;  NVD neovascularization of the disc; NVE neovascularization elsewhere; AREDS age related eye disease study; ARMD age related macular degeneration; POAG primary open angle glaucoma; EBMD epithelial/anterior basement membrane dystrophy; ACIOL anterior chamber intraocular lens; IOL intraocular lens; PCIOL posterior chamber intraocular lens; Phaco/IOL phacoemulsification with intraocular lens placement; PRK photorefractive keratectomy; LASIK laser assisted in situ  keratomileusis; HTN hypertension; DM diabetes mellitus; COPD chronic obstructive pulmonary disease

## 2017-12-11 ENCOUNTER — Ambulatory Visit (INDEPENDENT_AMBULATORY_CARE_PROVIDER_SITE_OTHER): Payer: Medicare HMO | Admitting: Ophthalmology

## 2017-12-11 ENCOUNTER — Encounter (INDEPENDENT_AMBULATORY_CARE_PROVIDER_SITE_OTHER): Payer: Self-pay | Admitting: Ophthalmology

## 2017-12-11 DIAGNOSIS — H353121 Nonexudative age-related macular degeneration, left eye, early dry stage: Secondary | ICD-10-CM

## 2017-12-11 DIAGNOSIS — H353211 Exudative age-related macular degeneration, right eye, with active choroidal neovascularization: Secondary | ICD-10-CM | POA: Diagnosis not present

## 2017-12-11 DIAGNOSIS — H43393 Other vitreous opacities, bilateral: Secondary | ICD-10-CM

## 2017-12-11 DIAGNOSIS — Z961 Presence of intraocular lens: Secondary | ICD-10-CM | POA: Diagnosis not present

## 2017-12-11 MED ORDER — BEVACIZUMAB CHEMO INJECTION 1.25MG/0.05ML SYRINGE FOR KALEIDOSCOPE
1.2500 mg | INTRAVITREAL | Status: AC
  Administered 2017-12-11: 1.25 mg via INTRAVITREAL

## 2018-01-07 NOTE — Progress Notes (Signed)
[facility] - Clinic Note  01/11/2018     CHIEF COMPLAINT Patient presents for Retina Follow Up   HISTORY OF PRESENT ILLNESS: Lindsay Harvey is a 82 y.o. female who presents to the clinic today for:   HPI    Retina Follow Up    Patient presents with  Wet AMD.  In right eye.  This started 2 months ago.  Severity is mild.  Since onset it is stable.  I, the attending physician,  performed the HPI with the patient and updated documentation appropriately.          Comments    F/UI EXU ARMD w/ active CNV OD. Patient states her vision feels about the same, "I need a new prescription". Pt is using Refresh gtts, Latanoprostggts and taking multivitamins Qd.  Pt is ready for Avastin today.       Last edited by Rennis Chris, MD on 01/11/2018  8:36 AM. (History)    Pt states OU VA is stable, states vision "is not getting worse"  Referring physician: Pearson Grippe, MD [address] [place], [place] [address]  HISTORICAL INFORMATION:   Selected notes from the MEDICAL RECORD NUMBER Referred by Dr. Marcha Solders for concern of AMD OU, CNV;  LEE- 11.20.18 (C. [name]) [BCVA OD: 20/40 OS 20/30] Ocular Hx- DES OU, pseudophakia OU, keratoconjunctivitis sicca OU PMH- high chol.    CURRENT MEDICATIONS: Current Outpatient Medications (Ophthalmic Drugs)  Medication Sig  . carboxymethylcellulose (REFRESH PLUS) 0.5 % SOLN 1 drop 3 (three) times daily as needed.   No current facility-administered medications for this visit.  (Ophthalmic Drugs)   Current Outpatient Medications (Other)  Medication Sig  . calcium-vitamin D 250-100 MG-UNIT tablet Take 1 tablet by mouth 2 (two) times daily.  . Multiple Vitamins-Minerals (PRESERVISION AREDS 2 PO) Take by mouth.  . simvastatin (ZOCOR) 40 MG tablet Take 40 mg by mouth daily.   Current Facility-Administered Medications (Other)  Medication Route  . Bevacizumab (AVASTIN) SOLN 1.25 mg Intravitreal  . Bevacizumab (AVASTIN) SOLN 1.25  mg Intravitreal  . Bevacizumab (AVASTIN) SOLN 1.25 mg Intravitreal  . Bevacizumab (AVASTIN) SOLN 1.25 mg Intravitreal  . Bevacizumab (AVASTIN) SOLN 1.25 mg Intravitreal      REVIEW OF SYSTEMS: ROS    Positive for: Eyes   Negative for: Constitutional, Gastrointestinal, Neurological, Skin, Genitourinary, Musculoskeletal, HENT, Endocrine, Cardiovascular, Respiratory, Psychiatric, Allergic/Imm, Heme/Lymph   Last edited by Eldridge Scot, LPN on 6/0/4540  8:12 AM. (History)    Through interp pt states    ALLERGIES No Known Allergies  PAST MEDICAL HISTORY Past Medical History:  Diagnosis Date  . Cataract 2013   OU   Past Surgical History:  Procedure Laterality Date  . CATARACT EXTRACTION     2013  . DILATION AND CURETTAGE OF UTERUS    . EYE SURGERY      FAMILY HISTORY Family History  Problem Relation Age of Onset  . Stroke Father     SOCIAL HISTORY Social History   Tobacco Use  . Smoking status: Never Smoker  . Smokeless tobacco: Never Used  Substance Use Topics  . Alcohol use: No    Frequency: Never  . Drug use: No         OPHTHALMIC EXAM:  Base Eye Exam    Visual Acuity (Snellen - Linear)      Right Left   Dist cc 20/40 -1 20/30   Dist ph cc NI 20/25 -2   Correction:  Glasses  Tonometry (Tonopen, 8:23 AM)      Right Left   Pressure 12 13       Pupils      Dark Light Shape React APD   Right 4 2 Round Brisk None   Left 4 2 Round Brisk None       Visual Fields (Counting fingers)      Left Right    Full Full       Extraocular Movement      Right Left    Full, Ortho Full, Ortho       Neuro/Psych    Oriented x3:  Yes   Mood/Affect:  Normal       Dilation    Both eyes:  1.0% Mydriacyl, 2.5% Phenylephrine @ 8:23 AM        Slit Lamp and Fundus Exam    Slit Lamp Exam      Right Left   Lids/Lashes Dermatochalasis - upper lid Dermatochalasis - upper lid   Conjunctiva/Sclera White and quiet White and quiet   Cornea Arcus, 1-2+  Punctate epithelial erosions centrally Arcus, Inferior 1+ Punctate epithelial erosions   Anterior Chamber Deep and quiet Deep and quiet   Iris Round and dilated Round and dilated   Lens PC IOL in good postion with anterior capsular phimosis, 1+ Posterior capsular opacification PC IOL in good postion with anterior capsular phimosis, 2+ Posterior capsular opacification   Vitreous syneresis Vitreous syneresis       Fundus Exam      Right Left   Disc Normal sharp rim, cupping   C/D Ratio 0.5 0.6   Macula +Edema, mild ERM, hemorrhage inferotemporal to fovea - resolved, Hard drusen inferior and temporal, Soft drusen superior, Retinal pigment epithelial mottling and atrophy centrally Flat, Drusen, early Atrophy, RPE mottling and clumping, +PEDs, No heme   Vessels Vascular attenuation Vascular attenuation   Periphery Attached, scattered peripheral drusen Attached with peripheral drusen          IMAGING AND PROCEDURES  Imaging and Procedures for 01/11/18  OCT, Retina - OU - Both Eyes       Right Eye Quality was good. Central Foveal Thickness: 295. Progression has been stable. Findings include abnormal foveal contour, retinal drusen , intraretinal fluid, pigment epithelial detachment, vitreomacular adhesion , vitreous traction, subretinal fluid.   Left Eye Quality was good. Central Foveal Thickness: 284. Progression has been stable. Findings include vitreomacular adhesion , vitreous traction, pigment epithelial detachment, abnormal foveal contour, no IRF, no SRF.   Notes Images taken, stored on drive  Diagnosis / Impression:  Exudative ARMD OD -- stable PED, no SRF; IRF slightly increased -- ?from VMT Nonexudative ARMD OS VMT OU  Clinical management:  See below  Abbreviations: NFP - Normal foveal profile. CME - cystoid macular edema. PED - pigment epithelial detachment. IRF - intraretinal fluid. SRF - subretinal fluid. EZ - ellipsoid zone. ERM - epiretinal membrane. ORA - outer  retinal atrophy. ORT - outer retinal tubulation. SRHM - subretinal hyper-reflective material         Fluorescein Angiography Optos (Transit OD)       Right Eye Progression has no prior data. Early phase findings include choroidal neovascularization. Mid/Late phase findings include choroidal neovascularization, staining.   Left Eye Progression has no prior data. Early phase findings include normal observations. Mid/Late phase findings include normal observations. Choroidal neovascularization is not present.   Notes Impression:  OD: small CNV OS: no CNV       Intravitreal Injection, Pharmacologic  Agent - OD - Right Eye       Time Out 01/11/2018. 9:32 AM. Confirmed correct patient, procedure, site, and patient consented.   Anesthesia Topical anesthesia was used. Anesthetic medications included Lidocaine 2%, Tetracaine 0.5%.   Procedure Preparation included 5% betadine to ocular surface. A supplied needle was used.   Injection: 1.25 mg Bevacizumab 1.25mg /0.11005ml   NDC: 01027-253-6650242-060-01    Lot: [id number]     Expiration Date: 01/27/2018   Route: Intravitreal   Site: Right Eye   Waste: 0 mg  Post-op Post injection exam found visual acuity of at least counting fingers. The patient tolerated the procedure well. There were no complications. The patient received written and verbal post procedure care education.                 ASSESSMENT/PLAN:    ICD-10-CM   1. Exudative age-related macular degeneration of right eye with active choroidal neovascularization (HCC) H35.3211 OCT, Retina - OU - Both Eyes    Fluorescein Angiography Optos (Transit OD)    Intravitreal Injection, Pharmacologic Agent - OD - Right Eye    Bevacizumab (AVASTIN) SOLN 1.25 mg  2. 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 Vitreomacular adhesion of right eye H43.821   4. Pseudophakia of both eyes Z96.1   5. 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    - The incidence pathology and anatomy of wet AMD discussed   - The ANCHOR, MARINA, CATT and VIEW trials discussed with patient.    - discussed treatment options including observation vs intravitreal anti-VEGF agents such as Avastin, Lucentis, Eylea.    - Risks of endophthalmitis and vascular occlusive events and atrophic changes discussed with patient  - S/P IVA OD #1 (12.03.18), #2 (01.02.19), #3 (01.30.19), #4 (03.01.19)  - has had good response to IVA  - intraretinal heme persists on exam, but improving  - OCT today shows interval decrease in PED and SRF; IRF slightly increased --?from VMT  - VA stable at 20/40 today from 20/60 OD initially  - FA 4.1.19 shows mild CNVM OD consistent with exudative ARMD  - active CNVM -- recommend IVA OD #5 today, 04.01.19  - pt wishes to be treated with IVA  - RBA of procedure discussed, questions answered  - informed consent obtained and signed  - see procedure note  - f/u in 4 wks  2. Age related macular degeneration, non-exudative, OS  - The incidence, anatomy, and pathology of dry AMD, risk of progression, and the AREDS and AREDS 2 study including smoking risks discussed with patient.  - Continue amsler grid monitoring  - will continue to monitor for conversion to exudative ARMD  3. Vitreo-macular traction OD-  The natural history, anatomy, potential for loss of vision, and treatment options including vitrectomy techniques and the complications of endophthalmitis, retinal detachment, vitreous hemorrhage, cataract progression and permanent vision loss discussed with the patient. - VMT may be contributing to CME/IRF picture - will monitor for now  4. Pseudophakia OU  - s/p CE/IOL OU  - beautiful surgery, doing well  - monitor  5. Vitreous syneresis OU  Pt with intermittent floaters OU  No frank PVD on  exam  Discussed findings and prognosis  No RT or RD on 360 peripheral exam  Reviewed s/s of RT/RD  Strict return precautions for any such RT/RD signs/symptoms  Ophthalmic Meds Ordered this visit:  Meds ordered this encounter  Medications  . Bevacizumab (AVASTIN) SOLN 1.25 mg       Return in about 1 month (around 02/08/2018) for F/U Exu AMD OD.  There are no Patient Instructions on file for this visit.   Explained the diagnoses, plan, and follow up with the patient and they expressed understanding.  Patient expressed understanding of the importance of proper follow up care.   This document serves as a record of services personally performed by Karie Chimera, MD, PhD. It was created on their behalf by Virgilio Belling, COA, a certified ophthalmic assistant. The creation of this record is the provider's dictation and/or activities during the visit.  Electronically signed by: Virgilio Belling, COA  01/11/18 12:14 PM   Karie Chimera, M.D., Ph.D. Diseases & Surgery of the Retina and Vitreous [facility] 01/11/18  I have reviewed the above documentation for accuracy and completeness, and I agree with the above. Karie Chimera, M.D., Ph.D. 01/11/18 12:14 PM     Abbreviations: M myopia (nearsighted); A astigmatism; H hyperopia (farsighted); P presbyopia; Mrx spectacle prescription;  CTL contact lenses; OD right eye; OS left eye; OU both eyes  XT exotropia; ET esotropia; PEK punctate epithelial keratitis; PEE punctate epithelial erosions; DES dry eye syndrome; MGD meibomian gland dysfunction; ATs artificial tears; PFAT's preservative free artificial tears; NSC nuclear sclerotic cataract; PSC posterior subcapsular cataract; ERM epi-retinal membrane; PVD posterior vitreous detachment; RD retinal detachment; DM diabetes mellitus; DR diabetic retinopathy; NPDR non-proliferative diabetic retinopathy; PDR proliferative diabetic retinopathy; CSME clinically  significant macular edema; DME diabetic macular edema; dbh dot blot hemorrhages; CWS cotton wool spot; POAG primary open angle glaucoma; C/D cup-to-disc ratio; HVF humphrey visual field; GVF goldmann visual field; OCT optical coherence tomography; IOP intraocular pressure; BRVO Branch retinal vein occlusion; CRVO central retinal vein occlusion; CRAO central retinal artery occlusion; BRAO branch retinal artery occlusion; RT retinal tear; SB scleral buckle; PPV pars plana vitrectomy; VH Vitreous hemorrhage; PRP panretinal laser photocoagulation; IVK intravitreal kenalog; VMT vitreomacular traction; MH Macular hole;  NVD neovascularization of the disc; NVE neovascularization elsewhere; AREDS age related eye disease study; ARMD age related macular degeneration; POAG primary open angle glaucoma; EBMD epithelial/anterior basement membrane dystrophy; ACIOL anterior chamber intraocular lens; IOL intraocular lens; PCIOL posterior chamber intraocular lens; Phaco/IOL phacoemulsification with intraocular lens placement; PRK photorefractive keratectomy; LASIK laser assisted in situ keratomileusis; HTN hypertension; DM diabetes mellitus; COPD chronic obstructive pulmonary disease

## 2018-01-11 ENCOUNTER — Encounter (INDEPENDENT_AMBULATORY_CARE_PROVIDER_SITE_OTHER): Payer: Self-pay | Admitting: Ophthalmology

## 2018-01-11 ENCOUNTER — Ambulatory Visit (INDEPENDENT_AMBULATORY_CARE_PROVIDER_SITE_OTHER): Payer: Medicare HMO | Admitting: Ophthalmology

## 2018-01-11 DIAGNOSIS — Z961 Presence of intraocular lens: Secondary | ICD-10-CM | POA: Diagnosis not present

## 2018-01-11 DIAGNOSIS — H43393 Other vitreous opacities, bilateral: Secondary | ICD-10-CM | POA: Diagnosis not present

## 2018-01-11 DIAGNOSIS — H353211 Exudative age-related macular degeneration, right eye, with active choroidal neovascularization: Secondary | ICD-10-CM

## 2018-01-11 DIAGNOSIS — H353121 Nonexudative age-related macular degeneration, left eye, early dry stage: Secondary | ICD-10-CM | POA: Diagnosis not present

## 2018-01-11 DIAGNOSIS — H43821 Vitreomacular adhesion, right eye: Secondary | ICD-10-CM | POA: Diagnosis not present

## 2018-01-11 MED ORDER — BEVACIZUMAB CHEMO INJECTION 1.25MG/0.05ML SYRINGE FOR KALEIDOSCOPE
1.2500 mg | INTRAVITREAL | Status: AC
  Administered 2018-01-11: 1.25 mg via INTRAVITREAL

## 2018-01-12 DIAGNOSIS — R109 Unspecified abdominal pain: Secondary | ICD-10-CM | POA: Diagnosis not present

## 2018-01-12 DIAGNOSIS — K5900 Constipation, unspecified: Secondary | ICD-10-CM | POA: Diagnosis not present

## 2018-01-12 DIAGNOSIS — R079 Chest pain, unspecified: Secondary | ICD-10-CM | POA: Diagnosis not present

## 2018-01-18 DIAGNOSIS — K5900 Constipation, unspecified: Secondary | ICD-10-CM | POA: Diagnosis not present

## 2018-01-18 DIAGNOSIS — R1013 Epigastric pain: Secondary | ICD-10-CM | POA: Diagnosis not present

## 2018-01-18 DIAGNOSIS — E782 Mixed hyperlipidemia: Secondary | ICD-10-CM | POA: Diagnosis not present

## 2018-01-26 DIAGNOSIS — E039 Hypothyroidism, unspecified: Secondary | ICD-10-CM | POA: Diagnosis not present

## 2018-02-02 DIAGNOSIS — R109 Unspecified abdominal pain: Secondary | ICD-10-CM | POA: Diagnosis not present

## 2018-02-02 DIAGNOSIS — K5900 Constipation, unspecified: Secondary | ICD-10-CM | POA: Diagnosis not present

## 2018-02-08 NOTE — Progress Notes (Signed)
[facility] - Clinic Note  02/09/2018     CHIEF COMPLAINT Patient presents for Retina Follow Up   HISTORY OF PRESENT ILLNESS: Lindsay Harvey is a 82 y.o. female who presents to the clinic today for:   HPI    Retina Follow Up    Patient presents with  Wet AMD.  In right eye.  This started 4 months ago.  Severity is mild.  Since onset it is stable.  I, the attending physician,  performed the HPI with the patient and updated documentation appropriately.          Comments    F/U EXU AMD OD. Patient states she has less"agitation", vision is stable. Denies flash, floaters ocular pain. Pt is taking PreserVision, she states pill is difficult to swallow, advised her to put pill in applesauce or pudding, it will coat the pill and may help her swallow it. Pt is ready for Avastin if indicated. Pt is using latanoprost gtt's qd         Last edited by Rennis Chris, MD on 02/09/2018  8:31 AM. (History)    Pt states OU VA is "better";   Referring physician: Pearson Grippe, MD [address] [place], [place] [address]  HISTORICAL INFORMATION:   Selected notes from the MEDICAL RECORD NUMBER Referred by Dr. Marcha Solders for concern of AMD OU, CNV;  LEE- 11.20.18 (C. [name]) [BCVA OD: 20/40 OS 20/30] Ocular Hx- DES OU, pseudophakia OU, keratoconjunctivitis sicca OU PMH- high chol.    CURRENT MEDICATIONS: Current Outpatient Medications (Ophthalmic Drugs)  Medication Sig  . carboxymethylcellulose (REFRESH PLUS) 0.5 % SOLN 1 drop 3 (three) times daily as needed.   No current facility-administered medications for this visit.  (Ophthalmic Drugs)   Current Outpatient Medications (Other)  Medication Sig  . calcium-vitamin D 250-100 MG-UNIT tablet Take 1 tablet by mouth 2 (two) times daily.  . Multiple Vitamins-Minerals (PRESERVISION AREDS 2 PO) Take by mouth.  . simvastatin (ZOCOR) 40 MG tablet Take 40 mg by mouth daily.   Current Facility-Administered Medications  (Other)  Medication Route  . Bevacizumab (AVASTIN) SOLN 1.25 mg Intravitreal  . Bevacizumab (AVASTIN) SOLN 1.25 mg Intravitreal  . Bevacizumab (AVASTIN) SOLN 1.25 mg Intravitreal  . Bevacizumab (AVASTIN) SOLN 1.25 mg Intravitreal  . Bevacizumab (AVASTIN) SOLN 1.25 mg Intravitreal  . Bevacizumab (AVASTIN) SOLN 1.25 mg Intravitreal      REVIEW OF SYSTEMS: ROS    Positive for: Eyes   Negative for: Constitutional, Gastrointestinal, Neurological, Skin, Genitourinary, Musculoskeletal, HENT, Endocrine, Cardiovascular, Respiratory, Psychiatric, Allergic/Imm, Heme/Lymph   Last edited by Eldridge Scot, LPN on 03/16/5408  8:21 AM. (History)    Through interp pt states    ALLERGIES No Known Allergies  PAST MEDICAL HISTORY Past Medical History:  Diagnosis Date  . Cataract 2013   OU   Past Surgical History:  Procedure Laterality Date  . CATARACT EXTRACTION     2013  . DILATION AND CURETTAGE OF UTERUS    . EYE SURGERY      FAMILY HISTORY Family History  Problem Relation Age of Onset  . Stroke Father     SOCIAL HISTORY Social History   Tobacco Use  . Smoking status: Never Smoker  . Smokeless tobacco: Never Used  Substance Use Topics  . Alcohol use: No    Frequency: Never  . Drug use: No         OPHTHALMIC EXAM:  Base Eye Exam    Visual Acuity (Snellen -  Linear)      Right Left   Dist [place] 20/50 -2 20/50 -2   Dist ph [place] 20/40 -2 20/40 +2       Tonometry (Tonopen, 8:20 AM)      Right Left   Pressure 13 14       Pupils      Dark Light Shape React APD   Right 3 2 Round Slow None   Left 2 1.5 Round Minimal None       Visual Fields (Counting fingers)      Left Right    Full Full       Extraocular Movement      Right Left    Full, Ortho Full, Ortho       Neuro/Psych    Oriented x3:  Yes   Mood/Affect:  Normal       Dilation    Both eyes:  1.0% Mydriacyl, 2.5% Phenylephrine @ 8:20 AM        Slit Lamp and Fundus Exam    Slit Lamp Exam       Right Left   Lids/Lashes Dermatochalasis - upper lid Dermatochalasis - upper lid   Conjunctiva/Sclera White and quiet White and quiet   Cornea Arcus, 1-2+ Punctate epithelial erosions centrally Arcus, Inferior 1+ Punctate epithelial erosions   Anterior Chamber Deep and quiet Deep and quiet   Iris Round and dilated Round and dilated   Lens PC IOL in good postion with anterior capsular phimosis, 1+ Posterior capsular opacification PC IOL in good postion with anterior capsular phimosis, 2+ Posterior capsular opacification   Vitreous syneresis Vitreous syneresis       Fundus Exam      Right Left   Disc Pink and Sharp sharp rim, cupping   C/D Ratio 0.5 0.6   Macula +Edema, mild ERM, Hard drusen inferior and temporal, Soft drusen superior, Retinal pigment epithelial mottling and atrophy centrally Flat, Drusen, early Atrophy, RPE mottling and clumping, +PEDs, No heme   Vessels Vascular attenuation Vascular attenuation   Periphery Attached, scattered peripheral drusen, peripapillary drusen  Attached with peripheral drusen          IMAGING AND PROCEDURES  Imaging and Procedures for 02/09/18  OCT, Retina - OU - Both Eyes       Right Eye Quality was good. Central Foveal Thickness: 294. Progression has been stable. Findings include abnormal foveal contour, retinal drusen , intraretinal fluid, pigment epithelial detachment, vitreomacular adhesion , vitreous traction.   Left Eye Quality was good. Central Foveal Thickness: 281. Progression has been stable. Findings include vitreomacular adhesion , vitreous traction, pigment epithelial detachment, abnormal foveal contour, no IRF, no SRF.   Notes Images taken, stored on drive  Diagnosis / Impression:  Exudative ARMD OD -- stable PED, no SRF; IRF w/ mild interval improvement -- ?from VMT Nonexudative ARMD OS VMT OU  Clinical management:  See below  Abbreviations: NFP - Normal foveal profile. CME - cystoid macular edema. PED - pigment  epithelial detachment. IRF - intraretinal fluid. SRF - subretinal fluid. EZ - ellipsoid zone. ERM - epiretinal membrane. ORA - outer retinal atrophy. ORT - outer retinal tubulation. SRHM - subretinal hyper-reflective material         Intravitreal Injection, Pharmacologic Agent - OD - Right Eye       Time Out 02/09/2018. 8:46 AM. Confirmed correct patient, procedure, site, and patient consented.   Anesthesia Topical anesthesia was used. Anesthetic medications included Lidocaine 2%, Tetracaine 0.5%.   Procedure Preparation included  5% betadine to ocular surface, eyelid speculum. A supplied needle was used.   Injection: 1.25 mg Bevacizumab 1.25mg /0.27ml   NDC: 11914-782-95    Lot: [id number]     Expiration Date: 02/15/2018   Route: Intravitreal   Site: Right Eye   Waste: 0 mg  Post-op Post injection exam found visual acuity of at least counting fingers. The patient tolerated the procedure well. There were no complications. The patient received written and verbal post procedure care education.                 ASSESSMENT/PLAN:    ICD-10-CM   1. Exudative age-related macular degeneration of right eye with active choroidal neovascularization (HCC) H35.3211 OCT, Retina - OU - Both Eyes    Intravitreal Injection, Pharmacologic Agent - OD - Right Eye    Bevacizumab (AVASTIN) SOLN 1.25 mg  2. 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 Vitreomacular adhesion of right eye H43.821   4. Pseudophakia of both eyes Z96.1   5. 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    - S/P IVA OD #1 (12.03.18), #2 (01.02.19), #3 (01.30.19), #4 (03.01.19), #5 (04.01.19)  - has had good response to IVA  - intraretinal heme persists on exam, but improving  - OCT today shows interval decrease in PED and SRF; IRF persistent but improved --likely influenced by  VMT  - VA relatively stable today -- 20/40 from 20/60  - FA 4.1.19 shows mild CNVM OD consistent with exudative ARMD  - active CNVM -- recommend IVA OD #6 today, (4.30.19)  - pt wishes to be treated with IVA  - RBA of procedure discussed, questions answered  - informed consent obtained and signed  - see procedure note  - f/u in 5 wks  2. Age related macular degeneration, non-exudative, OS  - The incidence, anatomy, and pathology of dry AMD, risk of progression, and the AREDS and AREDS 2 study including smoking risks discussed with patient.  - Continue amsler grid monitoring  - will continue to monitor for conversion to exudative ARMD  3. Vitreo-macular traction OD-  The natural history, anatomy, potential for loss of vision, and treatment options including vitrectomy techniques and the complications of endophthalmitis, retinal detachment, vitreous hemorrhage, cataract progression and permanent vision loss discussed with the patient. - VMT may be contributing to CME/IRF picture - will monitor for now  4. Pseudophakia OU  - s/p CE/IOL OU  - beautiful surgery, doing well  - monitor  5. Vitreous syneresis OU  Pt with intermittent floaters OU  No frank PVD on exam  Discussed findings and prognosis  No RT or RD on 360 peripheral exam  Reviewed s/s of RT/RD  Strict return precautions for any such RT/RD signs/symptoms    Ophthalmic Meds Ordered this visit:  Meds ordered this encounter  Medications  . Bevacizumab (AVASTIN) SOLN 1.25 mg       Return in about 5 weeks (around 03/16/2018) for F/U Exu AMD OD, Dilated Exam, OCT, Possible Injxn.  There are no Patient Instructions on file for this visit.   Explained the diagnoses, plan, and follow up with the patient and they expressed understanding.  Patient expressed understanding of the importance of proper follow up care.   This document serves as a record of services personally performed by Karie Chimera, MD, PhD. It was created  on their behalf by Virgilio Belling,  COA, a certified ophthalmic assistant. The creation of this record is the provider's dictation and/or activities during the visit.  Electronically signed by: Virgilio Belling, COA  02/09/18 8:46 AM   Karie Chimera, M.D., Ph.D. Diseases & Surgery of the Retina and Vitreous [facility] 02/09/18  I have reviewed the above documentation for accuracy and completeness, and I agree with the above. Karie Chimera, M.D., Ph.D. 02/09/18 8:47 AM    Abbreviations: M myopia (nearsighted); A astigmatism; H hyperopia (farsighted); P presbyopia; Mrx spectacle prescription;  CTL contact lenses; OD right eye; OS left eye; OU both eyes  XT exotropia; ET esotropia; PEK punctate epithelial keratitis; PEE punctate epithelial erosions; DES dry eye syndrome; MGD meibomian gland dysfunction; ATs artificial tears; PFAT's preservative free artificial tears; NSC nuclear sclerotic cataract; PSC posterior subcapsular cataract; ERM epi-retinal membrane; PVD posterior vitreous detachment; RD retinal detachment; DM diabetes mellitus; DR diabetic retinopathy; NPDR non-proliferative diabetic retinopathy; PDR proliferative diabetic retinopathy; CSME clinically significant macular edema; DME diabetic macular edema; dbh dot blot hemorrhages; CWS cotton wool spot; POAG primary open angle glaucoma; C/D cup-to-disc ratio; HVF humphrey visual field; GVF goldmann visual field; OCT optical coherence tomography; IOP intraocular pressure; BRVO Branch retinal vein occlusion; CRVO central retinal vein occlusion; CRAO central retinal artery occlusion; BRAO branch retinal artery occlusion; RT retinal tear; SB scleral buckle; PPV pars plana vitrectomy; VH Vitreous hemorrhage; PRP panretinal laser photocoagulation; IVK intravitreal kenalog; VMT vitreomacular traction; MH Macular hole;  NVD neovascularization of the disc; NVE neovascularization elsewhere; AREDS age related eye disease study;  ARMD age related macular degeneration; POAG primary open angle glaucoma; EBMD epithelial/anterior basement membrane dystrophy; ACIOL anterior chamber intraocular lens; IOL intraocular lens; PCIOL posterior chamber intraocular lens; Phaco/IOL phacoemulsification with intraocular lens placement; PRK photorefractive keratectomy; LASIK laser assisted in situ keratomileusis; HTN hypertension; DM diabetes mellitus; COPD chronic obstructive pulmonary disease

## 2018-02-09 ENCOUNTER — Ambulatory Visit (INDEPENDENT_AMBULATORY_CARE_PROVIDER_SITE_OTHER): Payer: Medicare HMO | Admitting: Ophthalmology

## 2018-02-09 ENCOUNTER — Encounter (INDEPENDENT_AMBULATORY_CARE_PROVIDER_SITE_OTHER): Payer: Self-pay | Admitting: Ophthalmology

## 2018-02-09 DIAGNOSIS — H353121 Nonexudative age-related macular degeneration, left eye, early dry stage: Secondary | ICD-10-CM

## 2018-02-09 DIAGNOSIS — Z961 Presence of intraocular lens: Secondary | ICD-10-CM

## 2018-02-09 DIAGNOSIS — H353211 Exudative age-related macular degeneration, right eye, with active choroidal neovascularization: Secondary | ICD-10-CM | POA: Diagnosis not present

## 2018-02-09 DIAGNOSIS — H43821 Vitreomacular adhesion, right eye: Secondary | ICD-10-CM

## 2018-02-09 DIAGNOSIS — H43393 Other vitreous opacities, bilateral: Secondary | ICD-10-CM

## 2018-02-09 MED ORDER — BEVACIZUMAB CHEMO INJECTION 1.25MG/0.05ML SYRINGE FOR KALEIDOSCOPE
1.2500 mg | INTRAVITREAL | Status: AC
  Administered 2018-02-09: 1.25 mg via INTRAVITREAL

## 2018-02-15 DIAGNOSIS — K219 Gastro-esophageal reflux disease without esophagitis: Secondary | ICD-10-CM | POA: Diagnosis not present

## 2018-02-15 DIAGNOSIS — H04129 Dry eye syndrome of unspecified lacrimal gland: Secondary | ICD-10-CM | POA: Diagnosis not present

## 2018-02-15 DIAGNOSIS — K5900 Constipation, unspecified: Secondary | ICD-10-CM | POA: Diagnosis not present

## 2018-02-15 DIAGNOSIS — E785 Hyperlipidemia, unspecified: Secondary | ICD-10-CM | POA: Diagnosis not present

## 2018-03-11 DIAGNOSIS — K5909 Other constipation: Secondary | ICD-10-CM | POA: Diagnosis not present

## 2018-03-11 DIAGNOSIS — E039 Hypothyroidism, unspecified: Secondary | ICD-10-CM | POA: Diagnosis not present

## 2018-03-11 DIAGNOSIS — E785 Hyperlipidemia, unspecified: Secondary | ICD-10-CM | POA: Diagnosis not present

## 2018-03-16 NOTE — Progress Notes (Signed)
[facility] - Clinic Note  03/17/2018     CHIEF COMPLAINT Patient presents for Retina Follow Up   HISTORY OF PRESENT ILLNESS: Lindsay Harvey is a 82 y.o. female who presents to the clinic today for:   HPI    Retina Follow Up    Patient presents with  Wet AMD.  In right eye.  This started 2 months ago.  Severity is mild.  Since onset it is stable.  I, the attending physician,  performed the HPI with the patient and updated documentation appropriately.          Comments    F/U EXU/ARMD. Patient states her vision is about the same, denies new on sets.Pt is ready for Avastin if indicated today.       Last edited by Rennis Chris, MD on 03/17/2018  8:41 AM. (History)    Pt states OU VA is "better";   Referring physician: Pearson Grippe, MD [address] [place], [place] [address]  HISTORICAL INFORMATION:   Selected notes from the MEDICAL RECORD NUMBER Referred by Dr. Marcha Solders for concern of AMD OU, CNV;  LEE- 11.20.18 (C. [name]) [BCVA OD: 20/40 OS 20/30] Ocular Hx- DES OU, pseudophakia OU, keratoconjunctivitis sicca OU PMH- high chol.    CURRENT MEDICATIONS: Current Outpatient Medications (Ophthalmic Drugs)  Medication Sig  . carboxymethylcellulose (REFRESH PLUS) 0.5 % SOLN 1 drop 3 (three) times daily as needed.   No current facility-administered medications for this visit.  (Ophthalmic Drugs)   Current Outpatient Medications (Other)  Medication Sig  . calcium-vitamin D 250-100 MG-UNIT tablet Take 1 tablet by mouth 2 (two) times daily.  . Multiple Vitamins-Minerals (PRESERVISION AREDS 2 PO) Take by mouth.  . simvastatin (ZOCOR) 40 MG tablet Take 40 mg by mouth daily.   Current Facility-Administered Medications (Other)  Medication Route  . Bevacizumab (AVASTIN) SOLN 1.25 mg Intravitreal  . Bevacizumab (AVASTIN) SOLN 1.25 mg Intravitreal  . Bevacizumab (AVASTIN) SOLN 1.25 mg Intravitreal  . Bevacizumab (AVASTIN) SOLN 1.25 mg Intravitreal  .  Bevacizumab (AVASTIN) SOLN 1.25 mg Intravitreal  . Bevacizumab (AVASTIN) SOLN 1.25 mg Intravitreal  . Bevacizumab (AVASTIN) SOLN 1.25 mg Intravitreal      REVIEW OF SYSTEMS: ROS    Positive for: Eyes   Negative for: Constitutional, Gastrointestinal, Neurological, Skin, Genitourinary, Musculoskeletal, HENT, Endocrine, Cardiovascular, Respiratory, Psychiatric, Allergic/Imm, Heme/Lymph   Last edited by Eldridge Scot, LPN on 6/0/4540  8:29 AM. (History)    Through interp pt states    ALLERGIES No Known Allergies  PAST MEDICAL HISTORY Past Medical History:  Diagnosis Date  . Cataract 2013   OU   Past Surgical History:  Procedure Laterality Date  . CATARACT EXTRACTION     2013  . DILATION AND CURETTAGE OF UTERUS    . EYE SURGERY      FAMILY HISTORY Family History  Problem Relation Age of Onset  . Stroke Father     SOCIAL HISTORY Social History   Tobacco Use  . Smoking status: Never Smoker  . Smokeless tobacco: Never Used  Substance Use Topics  . Alcohol use: No    Frequency: Never  . Drug use: No         OPHTHALMIC EXAM:  Base Eye Exam    Visual Acuity (Snellen - Linear)      Right Left   Dist cc 20/40 +2 20/40   Dist ph cc NI NI   Correction:  Glasses       Tonometry (  Tonopen, 8:31 AM)      Right Left   Pressure 18 18       Pupils      Dark Light Shape React APD   Right 3 2 Round Brisk None   Left 3 2 Round Brisk None       Visual Fields (Counting fingers)      Left Right    Full Full       Extraocular Movement      Right Left    Full, Ortho Full, Ortho       Neuro/Psych    Oriented x3:  Yes   Mood/Affect:  Normal       Dilation    Both eyes:  1.0% Mydriacyl, Paremyd @ 8:31 AM        Slit Lamp and Fundus Exam    Slit Lamp Exam      Right Left   Lids/Lashes Dermatochalasis - upper lid Dermatochalasis - upper lid   Conjunctiva/Sclera White and quiet White and quiet   Cornea Arcus, 1-2+ Punctate epithelial erosions  centrally Arcus, Inferior 1+ Punctate epithelial erosions   Anterior Chamber Deep and quiet Deep and quiet   Iris Round and dilated Round and dilated   Lens PC IOL in good postion with anterior capsular phimosis, 1+ Posterior capsular opacification PC IOL in good postion with anterior capsular phimosis, 2+ Posterior capsular opacification   Vitreous syneresis Vitreous syneresis       Fundus Exam      Right Left   Disc Pink and Sharp sharp rim, cupping   C/D Ratio 0.5 0.6   Macula +Edema, mild ERM, Hard drusen inferior and temporal, Soft drusen superior, Retinal pigment epithelial mottling and atrophy centrally, No heme Flat, Drusen, early Atrophy, RPE mottling and clumping, +PEDs, No heme   Vessels Vascular attenuation Vascular attenuation   Periphery Attached, scattered peripheral drusen, peripapillary drusen  Attached with peripheral drusen          IMAGING AND PROCEDURES  Imaging and Procedures for 02/09/18  OCT, Retina - OU - Both Eyes       Right Eye Quality was good. Central Foveal Thickness: 277. Progression has improved. Findings include abnormal foveal contour, retinal drusen , intraretinal fluid, pigment epithelial detachment, vitreous traction, no SRF.   Left Eye Quality was good. Central Foveal Thickness: 275. Progression has been stable. Findings include vitreous traction, pigment epithelial detachment, abnormal foveal contour, no IRF, no SRF, retinal drusen , epiretinal membrane.   Notes Images taken, stored on drive  Diagnosis / Impression:  Exudative ARMD OD -- stable PED, no SRF; IRF w/ mild interval improvement -- VMT likely contributing Nonexudative ARMD OS VMT OU  Clinical management:  See below  Abbreviations: NFP - Normal foveal profile. CME - cystoid macular edema. PED - pigment epithelial detachment. IRF - intraretinal fluid. SRF - subretinal fluid. EZ - ellipsoid zone. ERM - epiretinal membrane. ORA - outer retinal atrophy. ORT - outer retinal  tubulation. SRHM - subretinal hyper-reflective material         Intravitreal Injection, Pharmacologic Agent - OD - Right Eye       Time Out 03/17/2018. 8:50 AM. Confirmed correct patient, procedure, site, and patient consented.   Anesthesia Anesthetic medications included Lidocaine 2%, Proparacaine 0.5%.   Procedure Preparation included 5% betadine to ocular surface, eyelid speculum. A supplied needle was used.   Injection: 1.25 mg Bevacizumab 1.25mg /0.22ml   NDC: 70360-001-02    Lot: [id number]     Expiration Date: 05/05/2018  Route: Intravitreal   Site: Right Eye   Waste: 0 mg  Post-op Post injection exam found visual acuity of at least counting fingers. The patient tolerated the procedure well. There were no complications. The patient received written and verbal post procedure care education.                 ASSESSMENT/PLAN:    ICD-10-CM   1. Exudative age-related macular degeneration of right eye with active choroidal neovascularization (HCC) H35.3211 OCT, Retina - OU - Both Eyes    Intravitreal Injection, Pharmacologic Agent - OD - Right Eye    Bevacizumab (AVASTIN) SOLN 1.25 mg  2. 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 Vitreomacular adhesion of right eye H43.821   4. Pseudophakia of both eyes Z96.1   5. 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    - S/P IVA OD #1 (12.03.18), #2 (01.02.19), #3 (01.30.19), #4 (03.01.19), #5 (04.01.19), #6 (04.30.19)  -- 5 week interval  - has had good response to IVA  - intraretinal heme improved on exam  - OCT today shows stable PED and no SRF; IRF persistent but improved--likely influenced by VMT  - VA relatively stable today -- 20/40 from 20/60  - FA 04.01.19 shows mild CNVM OD consistent with exudative ARMD  - recommend IVA OD #7 today, (06.05.19) with extension to 6 weeks  - pt  wishes to be treated with IVA  - RBA of procedure discussed, questions answered  - informed consent obtained and signed  - see procedure note  - f/u in 6 wks  - if stable at 6 week interval, pt wishes to extend to 8 week interval  2. Age related macular degeneration, non-exudative, OS  - The incidence, anatomy, and pathology of dry AMD, risk of progression, and the AREDS and AREDS 2 study including smoking risks discussed with patient.  - Continue amsler grid monitoring  - will continue to monitor for conversion to exudative ARMD  3. Vitreo-macular traction OD-  The natural history, anatomy, potential for loss of vision, and treatment options including vitrectomy techniques and the complications of endophthalmitis, retinal detachment, vitreous hemorrhage, cataract progression and permanent vision loss discussed with the patient. - VMT may be contributing to CME/IRF picture - will monitor for now  4. Pseudophakia OU  - s/p CE/IOL OU  - beautiful surgery, doing well  - monitor  5. Vitreous syneresis OU  Pt with intermittent floaters OU  No frank PVD on exam  Discussed findings and prognosis  No RT or RD on 360 peripheral exam  Reviewed s/s of RT/RD  Strict return precautions for any such RT/RD signs/symptoms    Ophthalmic Meds Ordered this visit:  Meds ordered this encounter  Medications  . Bevacizumab (AVASTIN) SOLN 1.25 mg       Return in about 6 weeks (around 04/28/2018) for F/U Exu AMD OD, DFE, OCT.  There are no Patient Instructions on file for this visit.   Explained the diagnoses, plan, and follow up with the patient and they expressed understanding.  Patient expressed understanding of the importance of proper follow up care.   This document serves as a record of services personally performed by Karie Chimera, MD, PhD. It was created on their behalf by Virgilio Belling, COA, a certified ophthalmic assistant. The creation of this record is the provider's dictation  and/or activities during the  visit.  Electronically signed by: Virgilio BellingMeredith Fabian, COA  06.04.19 9:00 AM    Karie ChimeraBrian G. Destiny Hagin, M.D., Ph.D. Diseases & Surgery of the Retina and Vitreous [facility]  I have reviewed the above documentation for accuracy and completeness, and I agree with the above. Karie ChimeraBrian G. Emmalynn Pinkham, M.D., Ph.D. 03/17/18 9:00 AM     Abbreviations: M myopia (nearsighted); A astigmatism; H hyperopia (farsighted); P presbyopia; Mrx spectacle prescription;  CTL contact lenses; OD right eye; OS left eye; OU both eyes  XT exotropia; ET esotropia; PEK punctate epithelial keratitis; PEE punctate epithelial erosions; DES dry eye syndrome; MGD meibomian gland dysfunction; ATs artificial tears; PFAT's preservative free artificial tears; NSC nuclear sclerotic cataract; PSC posterior subcapsular cataract; ERM epi-retinal membrane; PVD posterior vitreous detachment; RD retinal detachment; DM diabetes mellitus; DR diabetic retinopathy; NPDR non-proliferative diabetic retinopathy; PDR proliferative diabetic retinopathy; CSME clinically significant macular edema; DME diabetic macular edema; dbh dot blot hemorrhages; CWS cotton wool spot; POAG primary open angle glaucoma; C/D cup-to-disc ratio; HVF humphrey visual field; GVF goldmann visual field; OCT optical coherence tomography; IOP intraocular pressure; BRVO Branch retinal vein occlusion; CRVO central retinal vein occlusion; CRAO central retinal artery occlusion; BRAO branch retinal artery occlusion; RT retinal tear; SB scleral buckle; PPV pars plana vitrectomy; VH Vitreous hemorrhage; PRP panretinal laser photocoagulation; IVK intravitreal kenalog; VMT vitreomacular traction; MH Macular hole;  NVD neovascularization of the disc; NVE neovascularization elsewhere; AREDS age related eye disease study; ARMD age related macular degeneration; POAG primary open angle glaucoma; EBMD epithelial/anterior basement membrane dystrophy; ACIOL  anterior chamber intraocular lens; IOL intraocular lens; PCIOL posterior chamber intraocular lens; Phaco/IOL phacoemulsification with intraocular lens placement; PRK photorefractive keratectomy; LASIK laser assisted in situ keratomileusis; HTN hypertension; DM diabetes mellitus; COPD chronic obstructive pulmonary disease

## 2018-03-17 ENCOUNTER — Encounter (INDEPENDENT_AMBULATORY_CARE_PROVIDER_SITE_OTHER): Payer: Self-pay | Admitting: Ophthalmology

## 2018-03-17 ENCOUNTER — Ambulatory Visit (INDEPENDENT_AMBULATORY_CARE_PROVIDER_SITE_OTHER): Payer: Medicare HMO | Admitting: Ophthalmology

## 2018-03-17 DIAGNOSIS — H353211 Exudative age-related macular degeneration, right eye, with active choroidal neovascularization: Secondary | ICD-10-CM | POA: Diagnosis not present

## 2018-03-17 DIAGNOSIS — H43393 Other vitreous opacities, bilateral: Secondary | ICD-10-CM

## 2018-03-17 DIAGNOSIS — Z961 Presence of intraocular lens: Secondary | ICD-10-CM

## 2018-03-17 DIAGNOSIS — H43821 Vitreomacular adhesion, right eye: Secondary | ICD-10-CM

## 2018-03-17 DIAGNOSIS — H353121 Nonexudative age-related macular degeneration, left eye, early dry stage: Secondary | ICD-10-CM

## 2018-03-17 MED ORDER — BEVACIZUMAB CHEMO INJECTION 1.25MG/0.05ML SYRINGE FOR KALEIDOSCOPE
1.2500 mg | INTRAVITREAL | Status: AC
Start: 2018-03-17 — End: ?
  Administered 2018-03-17: 1.25 mg via INTRAVITREAL

## 2018-03-25 DIAGNOSIS — H04123 Dry eye syndrome of bilateral lacrimal glands: Secondary | ICD-10-CM | POA: Diagnosis not present

## 2018-03-25 DIAGNOSIS — M3501 Sicca syndrome with keratoconjunctivitis: Secondary | ICD-10-CM | POA: Diagnosis not present

## 2018-04-27 NOTE — Progress Notes (Signed)
[facility] - Clinic Note  04/28/2018     CHIEF COMPLAINT Patient presents for Retina Follow Up   HISTORY OF PRESENT ILLNESS: Lindsay Harvey is a 82 y.o. female who presents to the clinic today for:   HPI    Retina Follow Up    Patient presents with  Wet AMD.  In right eye.  Severity is moderate.  Duration of 6 weeks.  Since onset it is stable.  I, the attending physician,  performed the HPI with the patient and updated documentation appropriately.          Comments    Pt presents for exu ARMD OD f/u, pt states VA is the same as last time, pt denies flashes, floaters, pain or wavy vision, pt uses Refresh gtts and Preservision vits, pt is ready for another injection,        Last edited by Rennis Chris, MD on 04/28/2018  9:50 AM. (History)    Pt states OU VA is "better";   Referring physician: Pearson Grippe, MD [address] [place], [place] [address]  HISTORICAL INFORMATION:   Selected notes from the MEDICAL RECORD NUMBER Referred by Dr. Marcha Solders for concern of AMD OU, CNV;  LEE- 11.20.18 (C. [name]) [BCVA OD: 20/40 OS 20/30] Ocular Hx- DES OU, pseudophakia OU, keratoconjunctivitis sicca OU PMH- high chol.    CURRENT MEDICATIONS: Current Outpatient Medications (Ophthalmic Drugs)  Medication Sig  . carboxymethylcellulose (REFRESH PLUS) 0.5 % SOLN 1 drop 3 (three) times daily as needed.   No current facility-administered medications for this visit.  (Ophthalmic Drugs)   Current Outpatient Medications (Other)  Medication Sig  . calcium-vitamin D 250-100 MG-UNIT tablet Take 1 tablet by mouth 2 (two) times daily.  . Multiple Vitamins-Minerals (PRESERVISION AREDS 2 PO) Take by mouth.  . simvastatin (ZOCOR) 40 MG tablet Take 40 mg by mouth daily.   Current Facility-Administered Medications (Other)  Medication Route  . Bevacizumab (AVASTIN) SOLN 1.25 mg Intravitreal  . Bevacizumab (AVASTIN) SOLN 1.25 mg Intravitreal  . Bevacizumab (AVASTIN)  SOLN 1.25 mg Intravitreal  . Bevacizumab (AVASTIN) SOLN 1.25 mg Intravitreal  . Bevacizumab (AVASTIN) SOLN 1.25 mg Intravitreal  . Bevacizumab (AVASTIN) SOLN 1.25 mg Intravitreal  . Bevacizumab (AVASTIN) SOLN 1.25 mg Intravitreal  . Bevacizumab (AVASTIN) SOLN 1.25 mg Intravitreal      REVIEW OF SYSTEMS: ROS    Positive for: Eyes   Negative for: Constitutional, Gastrointestinal, Neurological, Skin, Genitourinary, Musculoskeletal, HENT, Endocrine, Cardiovascular, Respiratory, Psychiatric, Allergic/Imm, Heme/Lymph   Last edited by Posey Boyer, COT on 04/28/2018  9:07 AM. (History)    Through interp pt states    ALLERGIES No Known Allergies  PAST MEDICAL HISTORY Past Medical History:  Diagnosis Date  . Cataract 2013   OU   Past Surgical History:  Procedure Laterality Date  . CATARACT EXTRACTION     2013  . DILATION AND CURETTAGE OF UTERUS    . EYE SURGERY      FAMILY HISTORY Family History  Problem Relation Age of Onset  . Stroke Father     SOCIAL HISTORY Social History   Tobacco Use  . Smoking status: Never Smoker  . Smokeless tobacco: Never Used  Substance Use Topics  . Alcohol use: No    Frequency: Never  . Drug use: No         OPHTHALMIC EXAM:  Base Eye Exam    Visual Acuity (Snellen - Linear)      Right Left  Dist cc 20/30 -1 20/40 +2   Dist ph cc NI NI       Tonometry (Tonopen, 9:12 AM)      Right Left   Pressure 16 12       Pupils      Dark Light Shape React APD   Right 4 2 Round Brisk None   Left 4 2 Round Brisk None       Visual Fields (Counting fingers)      Left Right    Full Full       Extraocular Movement      Right Left    Full, Ortho Full, Ortho       Neuro/Psych    Oriented x3:  Yes   Mood/Affect:  Normal       Dilation    Both eyes:  1.0% Mydriacyl, 2.5% Phenylephrine @ 9:12 AM        Slit Lamp and Fundus Exam    Slit Lamp Exam      Right Left   Lids/Lashes Dermatochalasis - upper lid Dermatochalasis  - upper lid   Conjunctiva/Sclera White and quiet White and quiet   Cornea Arcus, 1-2+ Punctate epithelial erosions centrally Arcus, Inferior 1+ Punctate epithelial erosions   Anterior Chamber Deep and quiet Deep and quiet   Iris Round and dilated Round and dilated   Lens PC IOL in good postion with anterior capsular phimosis, 1+ Posterior capsular opacification PC IOL in good postion with anterior capsular phimosis, 2+ Posterior capsular opacification   Vitreous syneresis Vitreous syneresis       Fundus Exam      Right Left   Disc Pink and Sharp sharp rim, cupping   C/D Ratio 0.5 0.6   Macula +Edema, mild ERM, Hard drusen inferior and temporal, Soft drusen superior, Retinal pigment epithelial mottling and atrophy centrally, No heme Flat, Drusen, early Atrophy, RPE mottling and clumping, +PEDs, No heme   Vessels Vascular attenuation Vascular attenuation   Periphery Attached, scattered peripheral drusen, peripapillary drusen  Attached with peripheral drusen          IMAGING AND PROCEDURES  Imaging and Procedures for 02/09/18  OCT, Retina - OU - Both Eyes       Right Eye Quality was good. Central Foveal Thickness: 299. Progression has been stable. Findings include abnormal foveal contour, retinal drusen , intraretinal fluid, pigment epithelial detachment, vitreous traction, no SRF.   Left Eye Quality was good. Central Foveal Thickness: 281. Progression has been stable. Findings include vitreous traction, pigment epithelial detachment, abnormal foveal contour, no IRF, no SRF, retinal drusen , epiretinal membrane.   Notes Images taken, stored on drive  Diagnosis / Impression:  Exudative ARMD OD -- stable PED, no SRF; IRF w/ mild interval increase -- VMT likely contributing Nonexudative ARMD OS VMT OU  Clinical management:  See below  Abbreviations: NFP - Normal foveal profile. CME - cystoid macular edema. PED - pigment epithelial detachment. IRF - intraretinal fluid. SRF -  subretinal fluid. EZ - ellipsoid zone. ERM - epiretinal membrane. ORA - outer retinal atrophy. ORT - outer retinal tubulation. SRHM - subretinal hyper-reflective material         Intravitreal Injection, Pharmacologic Agent - OD - Right Eye       Time Out 04/28/2018. 10:25 AM. Confirmed correct patient, procedure, site, and patient consented.   Anesthesia Topical anesthesia was used. Anesthetic medications included Lidocaine 2%, Tetracaine 0.5%.   Procedure Preparation included 5% betadine to ocular surface, eyelid speculum. A  30 gauge needle was used.   Injection: 1.25 mg Bevacizumab 1.25mg /0.7ml   NDC: 60454-098-11    Lot: [id number]     Expiration Date: 07/15/2018   Route: Intravitreal   Site: Right Eye   Waste: 0 mg  Post-op Post injection exam found visual acuity of at least counting fingers. The patient tolerated the procedure well. There were no complications. The patient received written and verbal post procedure care education.                 ASSESSMENT/PLAN:    ICD-10-CM   1. Exudative age-related macular degeneration of right eye with active choroidal neovascularization (HCC) H35.3211 OCT, Retina - OU - Both Eyes    Intravitreal Injection, Pharmacologic Agent - OD - Right Eye    Bevacizumab (AVASTIN) SOLN 1.25 mg  2. 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 Vitreomacular adhesion of right eye H43.821   4. Pseudophakia of both eyes Z96.1   5. 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    - S/P IVA OD #1 (12.03.18), #2 (01.02.19), #3 (01.30.19), #4 (03.01.19), #5 (04.01.19), #6 (04.30.19), #7 (06.05.19) -- 6 week interval  - has had good response to IVA  - intraretinal heme improved on exam  - OCT today shows improved PED and no SRF; IRF persistent / slightly worse--likely influenced by VMT  - VA relatively stable  today -- 20/40 from 20/60  - FA 04.01.19 shows mild CNVM OD consistent with exudative ARMD  - recommend IVA OD #8 today, (07.17.19) with repeat interval 6 weeks  - pt wishes to be treated with IVA  - RBA of procedure discussed, questions answered  - informed consent obtained and signed  - see procedure note  - f/u in 6 wks  - if stable again will extend to 7-8 weeks  2. Age related macular degeneration, non-exudative, OS  - The incidence, anatomy, and pathology of dry AMD, risk of progression, and the AREDS and AREDS 2 study including smoking risks discussed with patient.  - Continue amsler grid monitoring  - will continue to monitor for conversion to exudative ARMD  3. Vitreo-macular traction OD-  The natural history, anatomy, potential for loss of vision, and treatment options including vitrectomy techniques and the complications of endophthalmitis, retinal detachment, vitreous hemorrhage, cataract progression and permanent vision loss discussed with the patient. - VMT may be contributing to CME/IRF picture - will monitor for now  4. Pseudophakia OU  - s/p CE/IOL OU  - beautiful surgery, doing well  - monitor  5. Vitreous syneresis OU  Pt with intermittent floaters OU  No frank PVD on exam  Discussed findings and prognosis  No RT or RD on 360 peripheral exam  Reviewed s/s of RT/RD  Strict return precautions for any such RT/RD signs/symptoms    Ophthalmic Meds Ordered this visit:  Meds ordered this encounter  Medications  . Bevacizumab (AVASTIN) SOLN 1.25 mg       Return in about 6 weeks (around 06/09/2018) for f/u ex ARMD w/ VMT -- Dilated Exam, OCT, Possible Injxn.  There are no Patient Instructions on file for this visit.   Explained the diagnoses, plan, and follow up with the patient and they expressed understanding.  Patient expressed understanding of the importance of proper follow up care.   This document serves as a record of services personally performed by  Isaias Cowman.  Vanessa Barbara, MD, PhD. It was created on their behalf by Virgilio Belling, COA, a certified ophthalmic assistant. The creation of this record is the provider's dictation and/or activities during the visit.  Electronically signed by: Virgilio Belling, COA  07.16.19 9:04 AM    Karie Chimera, M.D., Ph.D. Diseases & Surgery of the Retina and Vitreous [facility]  I have reviewed the above documentation for accuracy and completeness, and I agree with the above. Karie Chimera, M.D., Ph.D. 04/29/18 9:07 AM    Abbreviations: M myopia (nearsighted); A astigmatism; H hyperopia (farsighted); P presbyopia; Mrx spectacle prescription;  CTL contact lenses; OD right eye; OS left eye; OU both eyes  XT exotropia; ET esotropia; PEK punctate epithelial keratitis; PEE punctate epithelial erosions; DES dry eye syndrome; MGD meibomian gland dysfunction; ATs artificial tears; PFAT's preservative free artificial tears; NSC nuclear sclerotic cataract; PSC posterior subcapsular cataract; ERM epi-retinal membrane; PVD posterior vitreous detachment; RD retinal detachment; DM diabetes mellitus; DR diabetic retinopathy; NPDR non-proliferative diabetic retinopathy; PDR proliferative diabetic retinopathy; CSME clinically significant macular edema; DME diabetic macular edema; dbh dot blot hemorrhages; CWS cotton wool spot; POAG primary open angle glaucoma; C/D cup-to-disc ratio; HVF humphrey visual field; GVF goldmann visual field; OCT optical coherence tomography; IOP intraocular pressure; BRVO Branch retinal vein occlusion; CRVO central retinal vein occlusion; CRAO central retinal artery occlusion; BRAO branch retinal artery occlusion; RT retinal tear; SB scleral buckle; PPV pars plana vitrectomy; VH Vitreous hemorrhage; PRP panretinal laser photocoagulation; IVK intravitreal kenalog; VMT vitreomacular traction; MH Macular hole;  NVD neovascularization of the disc; NVE neovascularization elsewhere; AREDS  age related eye disease study; ARMD age related macular degeneration; POAG primary open angle glaucoma; EBMD epithelial/anterior basement membrane dystrophy; ACIOL anterior chamber intraocular lens; IOL intraocular lens; PCIOL posterior chamber intraocular lens; Phaco/IOL phacoemulsification with intraocular lens placement; PRK photorefractive keratectomy; LASIK laser assisted in situ keratomileusis; HTN hypertension; DM diabetes mellitus; COPD chronic obstructive pulmonary disease

## 2018-04-28 ENCOUNTER — Encounter (INDEPENDENT_AMBULATORY_CARE_PROVIDER_SITE_OTHER): Payer: Self-pay | Admitting: Ophthalmology

## 2018-04-28 ENCOUNTER — Ambulatory Visit (INDEPENDENT_AMBULATORY_CARE_PROVIDER_SITE_OTHER): Payer: Medicare HMO | Admitting: Ophthalmology

## 2018-04-28 DIAGNOSIS — H43393 Other vitreous opacities, bilateral: Secondary | ICD-10-CM

## 2018-04-28 DIAGNOSIS — H353121 Nonexudative age-related macular degeneration, left eye, early dry stage: Secondary | ICD-10-CM

## 2018-04-28 DIAGNOSIS — Z961 Presence of intraocular lens: Secondary | ICD-10-CM

## 2018-04-28 DIAGNOSIS — H43821 Vitreomacular adhesion, right eye: Secondary | ICD-10-CM

## 2018-04-28 DIAGNOSIS — H353211 Exudative age-related macular degeneration, right eye, with active choroidal neovascularization: Secondary | ICD-10-CM

## 2018-04-29 ENCOUNTER — Encounter (INDEPENDENT_AMBULATORY_CARE_PROVIDER_SITE_OTHER): Payer: Medicare HMO | Admitting: Ophthalmology

## 2018-04-29 DIAGNOSIS — H353211 Exudative age-related macular degeneration, right eye, with active choroidal neovascularization: Secondary | ICD-10-CM | POA: Diagnosis not present

## 2018-04-29 MED ORDER — BEVACIZUMAB CHEMO INJECTION 1.25MG/0.05ML SYRINGE FOR KALEIDOSCOPE
1.2500 mg | INTRAVITREAL | Status: AC
  Administered 2018-04-29: 1.25 mg via INTRAVITREAL

## 2018-06-10 NOTE — Progress Notes (Signed)
[facility] - Clinic Note  06/11/2018     CHIEF COMPLAINT Patient presents for Retina Follow Up   HISTORY OF PRESENT ILLNESS: Lindsay Harvey is a 82 y.o. female who presents to the clinic today for:   HPI    Retina Follow Up    Patient presents with  Wet AMD.  In right eye.  Severity is mild.  Since onset it is gradually improving.  I, the attending physician,  performed the HPI with the patient and updated documentation appropriately.          Comments    F?U EXU AMD OD. Patient is accompany by [facility] interpretor , pt  states vision is "slightly better". Denies new visual onsets.Pt is using Refresh gtt's. Pt is ready for TX today if indicated       Last edited by Rennis Chris, MD on 06/11/2018  8:29 AM. (History)      Referring physician: Pearson Grippe, MD [address] [place], [place] [address]  HISTORICAL INFORMATION:   Selected notes from the MEDICAL RECORD NUMBER Referred by Dr. Marcha Solders for concern of AMD OU, CNV;  LEE- 11.20.18 (C. [name]) [BCVA OD: 20/40 OS 20/30] Ocular Hx- DES OU, pseudophakia OU, keratoconjunctivitis sicca OU PMH- high chol.    CURRENT MEDICATIONS: Current Outpatient Medications (Ophthalmic Drugs)  Medication Sig  . carboxymethylcellulose (REFRESH PLUS) 0.5 % SOLN 1 drop 3 (three) times daily as needed.   No current facility-administered medications for this visit.  (Ophthalmic Drugs)   Current Outpatient Medications (Other)  Medication Sig  . calcium-vitamin D 250-100 MG-UNIT tablet Take 1 tablet by mouth 2 (two) times daily.  . Multiple Vitamins-Minerals (PRESERVISION AREDS 2 PO) Take by mouth.  . simvastatin (ZOCOR) 40 MG tablet Take 40 mg by mouth daily.   Current Facility-Administered Medications (Other)  Medication Route  . Bevacizumab (AVASTIN) SOLN 1.25 mg Intravitreal  . Bevacizumab (AVASTIN) SOLN 1.25 mg Intravitreal  . Bevacizumab (AVASTIN) SOLN 1.25 mg Intravitreal  . Bevacizumab (AVASTIN)  SOLN 1.25 mg Intravitreal  . Bevacizumab (AVASTIN) SOLN 1.25 mg Intravitreal  . Bevacizumab (AVASTIN) SOLN 1.25 mg Intravitreal  . Bevacizumab (AVASTIN) SOLN 1.25 mg Intravitreal  . Bevacizumab (AVASTIN) SOLN 1.25 mg Intravitreal  . Bevacizumab (AVASTIN) SOLN 1.25 mg Intravitreal      REVIEW OF SYSTEMS: ROS    Positive for: Eyes   Negative for: Constitutional, Gastrointestinal, Neurological, Skin, Genitourinary, Musculoskeletal, HENT, Cardiovascular, Respiratory, Psychiatric, Allergic/Imm, Heme/Lymph   Last edited by Eldridge Scot, LPN on 8/41/3244  8:15 AM. (History)    Through interp pt states    ALLERGIES No Known Allergies  PAST MEDICAL HISTORY Past Medical History:  Diagnosis Date  . Cataract 2013   OU   Past Surgical History:  Procedure Laterality Date  . CATARACT EXTRACTION     2013  . DILATION AND CURETTAGE OF UTERUS    . EYE SURGERY      FAMILY HISTORY Family History  Problem Relation Age of Onset  . Stroke Father     SOCIAL HISTORY Social History   Tobacco Use  . Smoking status: Never Smoker  . Smokeless tobacco: Never Used  Substance Use Topics  . Alcohol use: No    Frequency: Never  . Drug use: No         OPHTHALMIC EXAM:  Base Eye Exam    Visual Acuity (Snellen - Linear)      Right Left   Dist [place] 20/40 20/40 -1  Dist ph [place] NI 20/40       Tonometry (Tonopen, 8:13 AM)      Right Left   Pressure 15 18       Pupils      Dark Light Shape React APD   Right 3 2 Round Brisk None   Left 3 2 Round Brisk None       Visual Fields      Left Right    Full Full       Extraocular Movement      Right Left    Full, Ortho Full, Ortho       Neuro/Psych    Oriented x3:  Yes   Mood/Affect:  Normal       Dilation    Both eyes:  1.0% Mydriacyl, 2.5% Phenylephrine @ 8:11 AM        Slit Lamp and Fundus Exam    Slit Lamp Exam      Right Left   Lids/Lashes Dermatochalasis - upper lid Dermatochalasis - upper lid    Conjunctiva/Sclera White and quiet White and quiet   Cornea Arcus, 1-2+ Punctate epithelial erosions centrally Arcus, Inferior 1+ Punctate epithelial erosions   Anterior Chamber Deep and quiet Deep and quiet   Iris Round and dilated Round and dilated   Lens PC IOL in good postion with anterior capsular phimosis, 1+ Posterior capsular opacification PC IOL in good postion with anterior capsular phimosis, 2+ Posterior capsular opacification   Vitreous syneresis Vitreous syneresis       Fundus Exam      Right Left   Disc Pink and Sharp sharp rim, cupping   C/D Ratio 0.5 0.6   Macula mild ERM, Hard drusen inferior and temporal, Soft drusen superior, Retinal pigment epithelial mottling and atrophy centrally, Interval improvement in PED, No heme Flat, Drusen, early Atrophy, RPE mottling and clumping, +PEDs, No heme   Vessels Vascular attenuation Vascular attenuation   Periphery Attached, scattered peripheral drusen, peripapillary drusen  Attached with peripheral drusen          IMAGING AND PROCEDURES  Imaging and Procedures for 02/09/18  OCT, Retina - OU - Both Eyes       Right Eye Quality was good. Central Foveal Thickness: 314. Findings include abnormal foveal contour, retinal drusen , intraretinal fluid, pigment epithelial detachment, vitreous traction, no SRF.   Left Eye Quality was good. Central Foveal Thickness: 278. Progression has been stable. Findings include vitreous traction, pigment epithelial detachment, abnormal foveal contour, no IRF, no SRF, retinal drusen , epiretinal membrane.   Notes Images taken, stored on drive  Diagnosis / Impression:  Exudative ARMD OD -- interval decrease in central PED, no SRF; IRF w/ mild interval increase -- VMT likely contributing Nonexudative ARMD OS VMT OU  Clinical management:  See below  Abbreviations: NFP - Normal foveal profile. CME - cystoid macular edema. PED - pigment epithelial detachment. IRF - intraretinal fluid. SRF -  subretinal fluid. EZ - ellipsoid zone. ERM - epiretinal membrane. ORA - outer retinal atrophy. ORT - outer retinal tubulation. SRHM - subretinal hyper-reflective material         Intravitreal Injection, Pharmacologic Agent - OD - Right Eye       Time Out 06/11/2018. 8:19 AM. Confirmed correct patient, procedure, site, and patient consented.   Anesthesia Topical anesthesia was used. Anesthetic medications included Lidocaine 2%, Tetracaine 0.5%.   Procedure Preparation included 5% betadine to ocular surface, eyelid speculum. A 30 gauge needle was used.   Injection:  1.25 mg Bevacizumab 1.25mg /0.9705ml   NDC: 50242-060-01, Lot: [id number] , Expiration date: 07/15/2018   Route: Intravitreal, Site: Right Eye, Waste: 0 mL  Post-op Post injection exam found visual acuity of at least counting fingers. The patient tolerated the procedure well. There were no complications. The patient received written and verbal post procedure care education.                 ASSESSMENT/PLAN:    ICD-10-CM   1. Exudative age-related macular degeneration of right eye with active choroidal neovascularization (HCC) H35.3211 OCT, Retina - OU - Both Eyes    Intravitreal Injection, Pharmacologic Agent - OD - Right Eye    Bevacizumab (AVASTIN) SOLN 1.25 mg  2. 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 Vitreomacular adhesion of right eye H43.821   4. Pseudophakia of both eyes Z96.1   5. 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    - S/P IVA OD #1 (12.03.18), #2 (01.02.19), #3 (01.30.19), #4 (03.01.19), #5 (04.01.19), #6 (04.30.19), #7 (06.05.19), #8 (07.19.19) -- 6 week interval  - has had good response to IVA  - intraretinal heme improved on exam  - OCT today shows further improvement in PED and no SRF; IRF persistent / slightly worse--likely influenced by VMT  - VA  stable today -- 20/40 from 20/60  - FA 04.01.19 shows mild CNVM OD consistent with exudative ARMD  - recommend IVA OD #9 today, (08.30.19) with repeat interval 6 weeks  - pt wishes to be treated with IVA  - RBA of procedure discussed, questions answered  - informed consent obtained and signed  - see procedure note  - f/u in 6 wks  - if stable/improved again will extend to 7-8 weeks  2. Age related macular degeneration, non-exudative, OS  - The incidence, anatomy, and pathology of dry AMD, risk of progression, and the AREDS and AREDS 2 study including smoking risks discussed with patient.  - Continue amsler grid monitoring  - will continue to monitor for conversion to exudative ARMD  3. Vitreo-macular traction OD-  The natural history, anatomy, potential for loss of vision, and treatment options including vitrectomy techniques and the complications of endophthalmitis, retinal detachment, vitreous hemorrhage, cataract progression and permanent vision loss discussed with the patient. - VMT may be contributing to CME/IRF picture - will monitor for now  4. Pseudophakia OU  - s/p CE/IOL OU  - beautiful surgery, doing well  - monitor  5. Vitreous syneresis OU  Pt with intermittent floaters OU  No frank PVD on exam  Discussed findings and prognosis  No RT or RD on 360 peripheral exam  Reviewed s/s of RT/RD  Strict return precautions for any such RT/RD signs/symptoms    Ophthalmic Meds Ordered this visit:  Meds ordered this encounter  Medications  . Bevacizumab (AVASTIN) SOLN 1.25 mg       Return in about 6 weeks (around 07/23/2018) for F/U Exu AMD, DFE, OCT, Possible Injxn.  There are no Patient Instructions on file for this visit.   Explained the diagnoses, plan, and follow up with the patient and they expressed understanding.  Patient expressed understanding of the importance of proper follow up care.   This document serves as a record of services personally performed by  Karie ChimeraBrian G. Malaia Buchta, MD, PhD. It was created on their behalf by Laurian BrimAmanda Brown, OA, an ophthalmic assistant. The creation of  this record is the provider's dictation and/or activities during the visit.    Electronically signed by: Laurian Brim, OA  08.29.2019 11:48 AM     Karie Chimera, M.D., Ph.D. Diseases & Surgery of the Retina and Vitreous [facility]   I have reviewed the above documentation for accuracy and completeness, and I agree with the above. Karie Chimera, M.D., Ph.D. 06/11/18 11:51 AM    Abbreviations: M myopia (nearsighted); A astigmatism; H hyperopia (farsighted); P presbyopia; Mrx spectacle prescription;  CTL contact lenses; OD right eye; OS left eye; OU both eyes  XT exotropia; ET esotropia; PEK punctate epithelial keratitis; PEE punctate epithelial erosions; DES dry eye syndrome; MGD meibomian gland dysfunction; ATs artificial tears; PFAT's preservative free artificial tears; NSC nuclear sclerotic cataract; PSC posterior subcapsular cataract; ERM epi-retinal membrane; PVD posterior vitreous detachment; RD retinal detachment; DM diabetes mellitus; DR diabetic retinopathy; NPDR non-proliferative diabetic retinopathy; PDR proliferative diabetic retinopathy; CSME clinically significant macular edema; DME diabetic macular edema; dbh dot blot hemorrhages; CWS cotton wool spot; POAG primary open angle glaucoma; C/D cup-to-disc ratio; HVF humphrey visual field; GVF goldmann visual field; OCT optical coherence tomography; IOP intraocular pressure; BRVO Branch retinal vein occlusion; CRVO central retinal vein occlusion; CRAO central retinal artery occlusion; BRAO branch retinal artery occlusion; RT retinal tear; SB scleral buckle; PPV pars plana vitrectomy; VH Vitreous hemorrhage; PRP panretinal laser photocoagulation; IVK intravitreal kenalog; VMT vitreomacular traction; MH Macular hole;  NVD neovascularization of the disc; NVE neovascularization elsewhere; AREDS age  related eye disease study; ARMD age related macular degeneration; POAG primary open angle glaucoma; EBMD epithelial/anterior basement membrane dystrophy; ACIOL anterior chamber intraocular lens; IOL intraocular lens; PCIOL posterior chamber intraocular lens; Phaco/IOL phacoemulsification with intraocular lens placement; PRK photorefractive keratectomy; LASIK laser assisted in situ keratomileusis; HTN hypertension; DM diabetes mellitus; COPD chronic obstructive pulmonary disease

## 2018-06-11 ENCOUNTER — Encounter (INDEPENDENT_AMBULATORY_CARE_PROVIDER_SITE_OTHER): Payer: Self-pay | Admitting: Ophthalmology

## 2018-06-11 ENCOUNTER — Ambulatory Visit (INDEPENDENT_AMBULATORY_CARE_PROVIDER_SITE_OTHER): Payer: Medicare HMO | Admitting: Ophthalmology

## 2018-06-11 DIAGNOSIS — H43393 Other vitreous opacities, bilateral: Secondary | ICD-10-CM

## 2018-06-11 DIAGNOSIS — Z961 Presence of intraocular lens: Secondary | ICD-10-CM

## 2018-06-11 DIAGNOSIS — H353121 Nonexudative age-related macular degeneration, left eye, early dry stage: Secondary | ICD-10-CM

## 2018-06-11 DIAGNOSIS — H353211 Exudative age-related macular degeneration, right eye, with active choroidal neovascularization: Secondary | ICD-10-CM

## 2018-06-11 DIAGNOSIS — H43821 Vitreomacular adhesion, right eye: Secondary | ICD-10-CM | POA: Diagnosis not present

## 2018-06-11 MED ORDER — BEVACIZUMAB CHEMO INJECTION 1.25MG/0.05ML SYRINGE FOR KALEIDOSCOPE
1.2500 mg | INTRAVITREAL | Status: AC
  Administered 2018-06-11: 1.25 mg via INTRAVITREAL

## 2018-07-21 NOTE — Progress Notes (Signed)
[facility] - Clinic Note  07/26/2018     CHIEF COMPLAINT Patient presents for Retina Follow Up   HISTORY OF PRESENT ILLNESS: Lindsay Harvey is a 82 y.o. female who presents to the clinic today for:   HPI    Retina Follow Up    Patient presents with  Wet AMD.  In right eye.  This started 3 months ago.  Severity is mild.  I, the attending physician,  performed the HPI with the patient and updated documentation appropriately.          Comments    F/U EXU AMD OD. Patient states "My vision is the same",denies new visual issues. Pt is using Refresh gtt's PRN       Last edited by Rennis Chris, MD on 07/26/2018 10:43 PM. (History)      Referring physician: Pearson Grippe, MD [address] [place], [place] [address]  HISTORICAL INFORMATION:   Selected notes from the MEDICAL RECORD NUMBER Referred by Dr. Marcha Solders for concern of AMD OU, CNV;  LEE- 11.20.18 (C. [name]) [BCVA OD: 20/40 OS 20/30] Ocular Hx- DES OU, pseudophakia OU, keratoconjunctivitis sicca OU PMH- high chol.    CURRENT MEDICATIONS: Current Outpatient Medications (Ophthalmic Drugs)  Medication Sig  . carboxymethylcellulose (REFRESH PLUS) 0.5 % SOLN 1 drop 3 (three) times daily as needed.   No current facility-administered medications for this visit.  (Ophthalmic Drugs)   Current Outpatient Medications (Other)  Medication Sig  . calcium-vitamin D 250-100 MG-UNIT tablet Take 1 tablet by mouth 2 (two) times daily.  . Multiple Vitamins-Minerals (PRESERVISION AREDS 2 PO) Take by mouth.  . simvastatin (ZOCOR) 40 MG tablet Take 40 mg by mouth daily.   Current Facility-Administered Medications (Other)  Medication Route  . Bevacizumab (AVASTIN) SOLN 1.25 mg Intravitreal  . Bevacizumab (AVASTIN) SOLN 1.25 mg Intravitreal  . Bevacizumab (AVASTIN) SOLN 1.25 mg Intravitreal  . Bevacizumab (AVASTIN) SOLN 1.25 mg Intravitreal  . Bevacizumab (AVASTIN) SOLN 1.25 mg Intravitreal  .  Bevacizumab (AVASTIN) SOLN 1.25 mg Intravitreal  . Bevacizumab (AVASTIN) SOLN 1.25 mg Intravitreal  . Bevacizumab (AVASTIN) SOLN 1.25 mg Intravitreal  . Bevacizumab (AVASTIN) SOLN 1.25 mg Intravitreal  . Bevacizumab (AVASTIN) SOLN 1.25 mg Intravitreal      REVIEW OF SYSTEMS: ROS    Positive for: Eyes   Negative for: Constitutional, Gastrointestinal, Neurological, Skin, Genitourinary, Musculoskeletal, HENT, Endocrine, Cardiovascular, Respiratory, Psychiatric, Allergic/Imm, Heme/Lymph   Last edited by Eldridge Scot, LPN on 78/29/5621  8:50 AM. (History)    Through interp pt states    ALLERGIES No Known Allergies  PAST MEDICAL HISTORY Past Medical History:  Diagnosis Date  . Cataract 2013   OU   Past Surgical History:  Procedure Laterality Date  . CATARACT EXTRACTION     2013  . DILATION AND CURETTAGE OF UTERUS    . EYE SURGERY      FAMILY HISTORY Family History  Problem Relation Age of Onset  . Stroke Father     SOCIAL HISTORY Social History   Tobacco Use  . Smoking status: Never Smoker  . Smokeless tobacco: Never Used  Substance Use Topics  . Alcohol use: No    Frequency: Never  . Drug use: No         OPHTHALMIC EXAM:  Base Eye Exam    Visual Acuity (Snellen - Linear)      Right Left   Dist cc 20/40 -2 20/40 -2   Dist ph cc 20/40 -1 NI  Correction:  Glasses       Tonometry (Tonopen, 8:54 AM)      Right Left   Pressure 17 16       Pupils      Dark Light Shape React APD   Right 3 2 Round Brisk None   Left 3 2 Round Brisk None       Visual Fields (Counting fingers)      Left Right    Full Full       Extraocular Movement      Right Left    Full, Ortho Full, Ortho       Neuro/Psych    Oriented x3:  Yes   Mood/Affect:  Normal       Dilation    Both eyes:  1.0% Mydriacyl, 2.5% Phenylephrine @ 8:53 AM        Slit Lamp and Fundus Exam    Slit Lamp Exam      Right Left   Lids/Lashes Dermatochalasis - upper lid  Dermatochalasis - upper lid   Conjunctiva/Sclera White and quiet White and quiet   Cornea Arcus, 1-2+ Punctate epithelial erosions centrally Arcus, Inferior 1+ Punctate epithelial erosions   Anterior Chamber Deep and quiet Deep and quiet   Iris Round and dilated Round and dilated   Lens PC IOL in good postion with anterior capsular phimosis, 1+ Posterior capsular opacification PC IOL in good postion with anterior capsular phimosis, 2+ Posterior capsular opacification   Vitreous syneresis Vitreous syneresis       Fundus Exam      Right Left   Disc Pink and Sharp sharp rim, cupping   C/D Ratio 0.5 0.6   Macula Blunted foveal reflex, mild ERM, Hard drusen inferior and temporal, Soft drusen superior, Retinal pigment epithelial mottling and atrophy centrally, Stable improvement in PED, soft drusen becoming hard, No heme Blunted foveal reflex, Flat, Drusen, early Atrophy, RPE mottling and clumping, +PEDs, No heme   Vessels Vascular attenuation Vascular attenuation   Periphery Attached, scattered peripheral drusen, peripapillary drusen  Attached with peripheral drusen          IMAGING AND PROCEDURES  Imaging and Procedures for 02/09/18  OCT, Retina - OU - Both Eyes       Right Eye Quality was good. Central Foveal Thickness: 339. Progression has worsened. Findings include abnormal foveal contour, retinal drusen , intraretinal fluid, pigment epithelial detachment, vitreous traction, no SRF (Interval increase in IRF - complicated by possible increase in VMT).   Left Eye Quality was good. Central Foveal Thickness: 279. Progression has been stable. Findings include vitreous traction, pigment epithelial detachment, abnormal foveal contour, no IRF, no SRF, retinal drusen , epiretinal membrane.   Notes Images taken, stored on drive  Diagnosis / Impression:  Exudative ARMD OD -- IRF w/ interval increase -- VMT likely contributing Nonexudative ARMD OS VMT OU  Clinical management:  See  below  Abbreviations: NFP - Normal foveal profile. CME - cystoid macular edema. PED - pigment epithelial detachment. IRF - intraretinal fluid. SRF - subretinal fluid. EZ - ellipsoid zone. ERM - epiretinal membrane. ORA - outer retinal atrophy. ORT - outer retinal tubulation. SRHM - subretinal hyper-reflective material         Intravitreal Injection, Pharmacologic Agent - OD - Right Eye       Time Out 07/26/2018. 10:20 AM. Confirmed correct patient, procedure, site, and patient consented.   Anesthesia Topical anesthesia was used. Anesthetic medications included Lidocaine 2%, Proparacaine 0.5%.   Procedure Preparation included  5% betadine to ocular surface, eyelid speculum. A 30 gauge needle was used.   Injection:  1.25 mg Bevacizumab 1.25mg /0.4ml   NDC: 50242-060-01, Lot: [id number] , Expiration date: 09/23/2018   Route: Intravitreal, Site: Right Eye, Waste: 0 mg  Post-op Post injection exam found visual acuity of at least counting fingers. The patient tolerated the procedure well. There were no complications. The patient received written and verbal post procedure care education.                 ASSESSMENT/PLAN:    ICD-10-CM   1. Exudative age-related macular degeneration of right eye with active choroidal neovascularization (HCC) H35.3211 OCT, Retina - OU - Both Eyes    Intravitreal Injection, Pharmacologic Agent - OD - Right Eye    Bevacizumab (AVASTIN) SOLN 1.25 mg  2. 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 Vitreomacular adhesion of right eye H43.821 OCT, Retina - OU - Both Eyes  4. Pseudophakia of both eyes Z96.1   5. 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    - S/P IVA OD #1 (12.03.18), #2 (01.02.19), #3 (01.30.19), #4 (03.01.19), #5 (04.01.19), #6 (04.30.19), #7 (06.05.19), #8 (07.19.19),  #9 (08.30.19) -- 6 week interval  - has had good response to IVA  - intraretinal heme improved on exam  - OCT today shows stable improvement in PED and no SRF; IRF persistent / slightly worse--likely influenced by VMT  - VA stable today -- 20/40 from 20/60  - FA 04.01.19 shows mild CNVM OD consistent with exudative ARMD  - recommend IVA OD #10 today, (10.14.19) -- shorten interval back to 5 wks  - pt wishes to be treated with IVA  - RBA of procedure discussed, questions answered  - informed consent obtained and signed  - see procedure note  - f/u in 5 weeks  2. Age related macular degeneration, non-exudative, OS  - The incidence, anatomy, and pathology of dry AMD, risk of progression, and the AREDS and AREDS 2 study including smoking risks discussed with patient.  - Continue amsler grid monitoring  - will continue to monitor for conversion to exudative ARMD  3. Vitreo-macular traction OD-  The natural history, anatomy, potential for loss of vision, and treatment options including vitrectomy techniques and the complications of endophthalmitis, retinal detachment, vitreous hemorrhage, cataract progression and permanent vision loss discussed with the patient. - VMT may be contributing to CME/IRF picture - will monitor for now  4. Pseudophakia OU  - s/p CE/IOL OU  - beautiful surgery, doing well  - monitor  5. Vitreous syneresis OU  Pt with intermittent floaters OU  No frank PVD on exam  Discussed findings and prognosis  No RT or RD on 360 peripheral exam  Reviewed s/s of RT/RD  Strict return precautions for any such RT/RD signs/symptoms    Ophthalmic Meds Ordered this visit:  Meds ordered this encounter  Medications  . Bevacizumab (AVASTIN) SOLN 1.25 mg       Return in about 5 weeks (around 08/30/2018) for F/U Exu AMD OD, DFE, OCT.  There are no Patient Instructions on file for this visit.   Explained the diagnoses, plan, and follow up with the patient and they  expressed understanding.  Patient expressed understanding of the importance of proper follow up care.   This document serves as a record of services personally performed  by Karie Chimera, MD, PhD. It was created on their behalf by Laurian Brim, OA, an ophthalmic assistant. The creation of this record is the provider's dictation and/or activities during the visit.    Electronically signed by: Laurian Brim, OA  10.09.19 10:50 PM    Karie Chimera, M.D., Ph.D. Diseases & Surgery of the Retina and Vitreous [facility]   I have reviewed the above documentation for accuracy and completeness, and I agree with the above. Karie Chimera, M.D., Ph.D. 07/26/18 10:50 PM   Abbreviations: M myopia (nearsighted); A astigmatism; H hyperopia (farsighted); P presbyopia; Mrx spectacle prescription;  CTL contact lenses; OD right eye; OS left eye; OU both eyes  XT exotropia; ET esotropia; PEK punctate epithelial keratitis; PEE punctate epithelial erosions; DES dry eye syndrome; MGD meibomian gland dysfunction; ATs artificial tears; PFAT's preservative free artificial tears; NSC nuclear sclerotic cataract; PSC posterior subcapsular cataract; ERM epi-retinal membrane; PVD posterior vitreous detachment; RD retinal detachment; DM diabetes mellitus; DR diabetic retinopathy; NPDR non-proliferative diabetic retinopathy; PDR proliferative diabetic retinopathy; CSME clinically significant macular edema; DME diabetic macular edema; dbh dot blot hemorrhages; CWS cotton wool spot; POAG primary open angle glaucoma; C/D cup-to-disc ratio; HVF humphrey visual field; GVF goldmann visual field; OCT optical coherence tomography; IOP intraocular pressure; BRVO Branch retinal vein occlusion; CRVO central retinal vein occlusion; CRAO central retinal artery occlusion; BRAO branch retinal artery occlusion; RT retinal tear; SB scleral buckle; PPV pars plana vitrectomy; VH Vitreous hemorrhage; PRP panretinal laser  photocoagulation; IVK intravitreal kenalog; VMT vitreomacular traction; MH Macular hole;  NVD neovascularization of the disc; NVE neovascularization elsewhere; AREDS age related eye disease study; ARMD age related macular degeneration; POAG primary open angle glaucoma; EBMD epithelial/anterior basement membrane dystrophy; ACIOL anterior chamber intraocular lens; IOL intraocular lens; PCIOL posterior chamber intraocular lens; Phaco/IOL phacoemulsification with intraocular lens placement; PRK photorefractive keratectomy; LASIK laser assisted in situ keratomileusis; HTN hypertension; DM diabetes mellitus; COPD chronic obstructive pulmonary disease

## 2018-07-26 ENCOUNTER — Encounter (INDEPENDENT_AMBULATORY_CARE_PROVIDER_SITE_OTHER): Payer: Self-pay | Admitting: Ophthalmology

## 2018-07-26 ENCOUNTER — Ambulatory Visit (INDEPENDENT_AMBULATORY_CARE_PROVIDER_SITE_OTHER): Payer: Medicare HMO | Admitting: Ophthalmology

## 2018-07-26 DIAGNOSIS — H353121 Nonexudative age-related macular degeneration, left eye, early dry stage: Secondary | ICD-10-CM | POA: Diagnosis not present

## 2018-07-26 DIAGNOSIS — Z961 Presence of intraocular lens: Secondary | ICD-10-CM | POA: Diagnosis not present

## 2018-07-26 DIAGNOSIS — H353211 Exudative age-related macular degeneration, right eye, with active choroidal neovascularization: Secondary | ICD-10-CM

## 2018-07-26 DIAGNOSIS — H43393 Other vitreous opacities, bilateral: Secondary | ICD-10-CM | POA: Diagnosis not present

## 2018-07-26 DIAGNOSIS — H43821 Vitreomacular adhesion, right eye: Secondary | ICD-10-CM

## 2018-07-26 MED ORDER — BEVACIZUMAB CHEMO INJECTION 1.25MG/0.05ML SYRINGE FOR KALEIDOSCOPE
1.2500 mg | INTRAVITREAL | Status: AC
  Administered 2018-07-26: 1.25 mg via INTRAVITREAL

## 2018-08-26 NOTE — Progress Notes (Signed)
[facility] - Clinic Note  08/30/2018     CHIEF COMPLAINT Patient presents for Retina Follow Up   HISTORY OF PRESENT ILLNESS: Lindsay Harvey is a 82 y.o. female who presents to the clinic today for:   HPI    Retina Follow Up    In right eye.  This started 4 months ago.  Severity is mild.  Since onset it is gradually worsening.  I, the attending physician,  performed the HPI with the patient and updated documentation appropriately.          Comments    F/U EXU ARMD OD . Patient states"over the weekend my eyes was dry", denies flashes, floaters and ocular pain.        Last edited by Rennis ChrisZamora, Chiniqua Kilcrease, MD on 08/30/2018  8:50 AM. (History)    pt states she does not think her right eye vision is any better than last time,   Referring physician: Pearson GrippeKim, James, MD [address] [place] [id number]  HISTORICAL INFORMATION:   Selected notes from the MEDICAL RECORD NUMBER Referred by Dr. Marcha Solders. [name] for concern of AMD OU, CNV;  LEE- 11.20.18 (C. [name]) [BCVA OD: 20/40 OS 20/30] Ocular Hx- DES OU, pseudophakia OU, keratoconjunctivitis sicca OU PMH- high chol.    CURRENT MEDICATIONS: Current Outpatient Medications (Ophthalmic Drugs)  Medication Sig  . carboxymethylcellulose (REFRESH PLUS) 0.5 % SOLN 1 drop 3 (three) times daily as needed.   No current facility-administered medications for this visit.  (Ophthalmic Drugs)   Current Outpatient Medications (Other)  Medication Sig  . calcium-vitamin D 250-100 MG-UNIT tablet Take 1 tablet by mouth 2 (two) times daily.  . Multiple Vitamins-Minerals (PRESERVISION AREDS 2 PO) Take by mouth.  . simvastatin (ZOCOR) 40 MG tablet Take 40 mg by mouth daily.   Current Facility-Administered Medications (Other)  Medication Route  . Bevacizumab (AVASTIN) SOLN 1.25 mg Intravitreal  . Bevacizumab (AVASTIN) SOLN 1.25 mg Intravitreal  . Bevacizumab (AVASTIN) SOLN 1.25 mg Intravitreal  . Bevacizumab (AVASTIN) SOLN  1.25 mg Intravitreal  . Bevacizumab (AVASTIN) SOLN 1.25 mg Intravitreal  . Bevacizumab (AVASTIN) SOLN 1.25 mg Intravitreal  . Bevacizumab (AVASTIN) SOLN 1.25 mg Intravitreal  . Bevacizumab (AVASTIN) SOLN 1.25 mg Intravitreal  . Bevacizumab (AVASTIN) SOLN 1.25 mg Intravitreal  . Bevacizumab (AVASTIN) SOLN 1.25 mg Intravitreal  . Bevacizumab (AVASTIN) SOLN 1.25 mg Intravitreal      REVIEW OF SYSTEMS: ROS    Positive for: Eyes   Negative for: Constitutional, Gastrointestinal, Neurological, Skin, Genitourinary, Musculoskeletal, HENT, Endocrine, Cardiovascular, Respiratory, Psychiatric, Allergic/Imm, Heme/Lymph   Last edited by Eldridge ScotKendrick, Glenda, LPN on 60/45/409811/18/2019  8:22 AM. (History)    Through interp pt states    ALLERGIES No Known Allergies  PAST MEDICAL HISTORY Past Medical History:  Diagnosis Date  . Cataract 2013   OU   Past Surgical History:  Procedure Laterality Date  . CATARACT EXTRACTION     2013  . DILATION AND CURETTAGE OF UTERUS    . EYE SURGERY      FAMILY HISTORY Family History  Problem Relation Age of Onset  . Stroke Father     SOCIAL HISTORY Social History   Tobacco Use  . Smoking status: Never Smoker  . Smokeless tobacco: Never Used  Substance Use Topics  . Alcohol use: No    Frequency: Never  . Drug use: No         OPHTHALMIC EXAM:  Base Eye Exam    Visual Acuity (Snellen -  Linear)      Right Left   Dist cc 20/40 +2 20/40 -1   Dist ph cc NI NI   Correction:  Glasses       Tonometry (Tonopen, 8:24 AM)      Right Left   Pressure 16 16       Pupils      Dark Light Shape React APD   Right 3 2 Round Brisk None   Left 3 2 Round Brisk None       Visual Fields (Counting fingers)      Left Right    Full Full       Extraocular Movement      Right Left    Full, Ortho Full, Ortho       Neuro/Psych    Oriented x3:  Yes   Mood/Affect:  Normal       Dilation    Both eyes:  1.0% Mydriacyl, 2.5% Phenylephrine @ 8:24 AM         Slit Lamp and Fundus Exam    Slit Lamp Exam      Right Left   Lids/Lashes Dermatochalasis - upper lid Dermatochalasis - upper lid   Conjunctiva/Sclera White and quiet White and quiet   Cornea Arcus, 1-2+ Punctate epithelial erosions  Arcus, Inferior 1+ Punctate epithelial erosions   Anterior Chamber Deep and quiet Deep and quiet   Iris Round and dilated Round and dilated   Lens PC IOL in good postion with anterior capsular phimosis, 1+ Posterior capsular opacification PC IOL in good postion with anterior capsular phimosis, 2+ Posterior capsular opacification sparing center   Vitreous syneresis Vitreous syneresis       Fundus Exam      Right Left   Disc Pink and Sharp Normal   C/D Ratio 0.5 0.6   Macula Blunted foveal reflex, mild ERM, +drusen, Retinal pigment epithelial mottling and atrophy centrally, Stable improvement in PED, No heme. Interval increase in cystic changes from VMT Blunted foveal reflex, Flat, Drusen, early Atrophy, RPE mottling, clumping, and atrophy +PEDs, No heme   Vessels Vascular attenuation Vascular attenuation   Periphery Attached, scattered peripheral drusen, peripapillary drusen  Attached with peripheral drusen          IMAGING AND PROCEDURES  Imaging and Procedures for 02/09/18  OCT, Retina - OU - Both Eyes       Right Eye Quality was good. Central Foveal Thickness: 451. Progression has worsened. Findings include abnormal foveal contour, retinal drusen , intraretinal fluid, pigment epithelial detachment, vitreous traction, no SRF (Interval increase in IRF - complicated by possible increase in VMT).   Left Eye Quality was good. Central Foveal Thickness: 293. Progression has been stable. Findings include vitreous traction, pigment epithelial detachment, abnormal foveal contour, no IRF, no SRF, retinal drusen , epiretinal membrane (Trace cystic changes below VMT).   Notes Images taken, stored on drive  Diagnosis / Impression:  Exudative ARMD OD --  IRF w/ interval increase -- mostly due to VMT Nonexudative ARMD OS VMT OU  Clinical management:  See below  Abbreviations: NFP - Normal foveal profile. CME - cystoid macular edema. PED - pigment epithelial detachment. IRF - intraretinal fluid. SRF - subretinal fluid. EZ - ellipsoid zone. ERM - epiretinal membrane. ORA - outer retinal atrophy. ORT - outer retinal tubulation. SRHM - subretinal hyper-reflective material         Intravitreal Injection, Pharmacologic Agent - OD - Right Eye       Time Out 08/30/2018. 8:50  AM. Confirmed correct patient, procedure, site, and patient consented.   Anesthesia Topical anesthesia was used. Anesthetic medications included Lidocaine 2%, Proparacaine 0.5%.   Procedure Preparation included 5% betadine to ocular surface, eyelid speculum. A supplied needle was used.   Injection:  1.25 mg Bevacizumab 1.25mg /0.68ml   NDC: 50242-060-01, Lot: [id number] , Expiration date: 09/23/2018   Route: Intravitreal, Site: Right Eye, Waste: 0 mg  Post-op Post injection exam found visual acuity of at least counting fingers. The patient tolerated the procedure well. There were no complications. The patient received written and verbal post procedure care education.                 ASSESSMENT/PLAN:    ICD-10-CM   1. Exudative age-related macular degeneration of right eye with active choroidal neovascularization (HCC) H35.3211 Intravitreal Injection, Pharmacologic Agent - OD - Right Eye    Bevacizumab (AVASTIN) SOLN 1.25 mg  2. 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 Pseudophakia of both eyes Z96.1   5. 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 Retinal edema H35.81 OCT, Retina - OU - Both Eyes    1. 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    - S/P IVA OD #1 (12.03.18), #2 (01.02.19), #3 (01.30.19), #4  (03.01.19), #5 (04.01.19), #6 (04.30.19), #7 (06.05.19), #8 (07.19.19), #9 (08.30.19), #10 (10.14.19)  - has had good response to IVA  - intraretinal heme improved on exam  - OCT today shows stable improvement in PED and no SRF; IRF persistent / slightly worse--likely influenced by VMT  - VA stable today -- 20/40 from 20/60  - FA 04.01.19 shows mild CNVM OD consistent with exudative ARMD  - recommend IVA OD #11 today, (11.18.19) -- f/u in 6 wks  - pt wishes to be treated with IVA  - RBA of procedure discussed, questions answered  - informed consent obtained and signed  - see procedure note  - f/u in 6 weeks  2. Age related macular degeneration, non-exudative, OS  - The incidence, anatomy, and pathology of dry AMD, risk of progression, and the AREDS and AREDS 2 study including smoking risks discussed with patient.  - Continue amsler grid monitoring  - will continue to monitor for conversion to exudative ARMD  3. 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 - VMT appears to be worsening OD, but vision remains stable - OS stable - discussed possibility of surgery should VMT worsen vision or if macular hole develops - also discussed possibility of VMT release - will continue to monitor for now - f/u 6 wks  4. Pseudophakia OU  - s/p CE/IOL OU  - beautiful surgery, doing well  - monitor  5. Vitreous syneresis OU  Pt with intermittent floaters OU  No frank PVD on exam  Discussed findings and prognosis  No RT or RD on 360 peripheral exam  Reviewed s/s of RT/RD  Strict return precautions for any such RT/RD signs/symptoms    Ophthalmic Meds Ordered this visit:  Meds ordered this encounter  Medications  . Bevacizumab  (AVASTIN) SOLN 1.25 mg       Return in about 6 weeks (around 10/11/2018) for F/U ARMD OD, DFE, OCT.  There are  no Patient Instructions on file for this visit.   Explained the diagnoses, plan, and follow up with the patient and they expressed understanding.  Patient expressed understanding of the importance of proper follow up care.   This document serves as a record of services personally performed by Karie Chimera, MD, PhD. It was created on their behalf by Laurian Brim, OA, an ophthalmic assistant. The creation of this record is the provider's dictation and/or activities during the visit.    Electronically signed by: Laurian Brim, OA  11.14.19 8:51 AM     Karie Chimera, M.D., Ph.D. Diseases & Surgery of the Retina and Vitreous [facility]   I have reviewed the above documentation for accuracy and completeness, and I agree with the above. Karie Chimera, M.D., Ph.D. 08/30/18 8:54 AM    Abbreviations: M myopia (nearsighted); A astigmatism; H hyperopia (farsighted); P presbyopia; Mrx spectacle prescription;  CTL contact lenses; OD right eye; OS left eye; OU both eyes  XT exotropia; ET esotropia; PEK punctate epithelial keratitis; PEE punctate epithelial erosions; DES dry eye syndrome; MGD meibomian gland dysfunction; ATs artificial tears; PFAT's preservative free artificial tears; NSC nuclear sclerotic cataract; PSC posterior subcapsular cataract; ERM epi-retinal membrane; PVD posterior vitreous detachment; RD retinal detachment; DM diabetes mellitus; DR diabetic retinopathy; NPDR non-proliferative diabetic retinopathy; PDR proliferative diabetic retinopathy; CSME clinically significant macular edema; DME diabetic macular edema; dbh dot blot hemorrhages; CWS cotton wool spot; POAG primary open angle glaucoma; C/D cup-to-disc ratio; HVF humphrey visual field; GVF goldmann visual field; OCT optical coherence tomography; IOP intraocular pressure; BRVO Branch retinal  vein occlusion; CRVO central retinal vein occlusion; CRAO central retinal artery occlusion; BRAO branch retinal artery occlusion; RT retinal tear; SB scleral buckle; PPV pars plana vitrectomy; VH Vitreous hemorrhage; PRP panretinal laser photocoagulation; IVK intravitreal kenalog; VMT vitreomacular traction; MH Macular hole;  NVD neovascularization of the disc; NVE neovascularization elsewhere; AREDS age related eye disease study; ARMD age related macular degeneration; POAG primary open angle glaucoma; EBMD epithelial/anterior basement membrane dystrophy; ACIOL anterior chamber intraocular lens; IOL intraocular lens; PCIOL posterior chamber intraocular lens; Phaco/IOL phacoemulsification with intraocular lens placement; PRK photorefractive keratectomy; LASIK laser assisted in situ keratomileusis; HTN hypertension; DM diabetes mellitus; COPD chronic obstructive pulmonary disease

## 2018-08-30 ENCOUNTER — Ambulatory Visit (INDEPENDENT_AMBULATORY_CARE_PROVIDER_SITE_OTHER): Payer: Medicare HMO | Admitting: Ophthalmology

## 2018-08-30 ENCOUNTER — Encounter (INDEPENDENT_AMBULATORY_CARE_PROVIDER_SITE_OTHER): Payer: Self-pay | Admitting: Ophthalmology

## 2018-08-30 DIAGNOSIS — H353121 Nonexudative age-related macular degeneration, left eye, early dry stage: Secondary | ICD-10-CM

## 2018-08-30 DIAGNOSIS — H3581 Retinal edema: Secondary | ICD-10-CM

## 2018-08-30 DIAGNOSIS — H353211 Exudative age-related macular degeneration, right eye, with active choroidal neovascularization: Secondary | ICD-10-CM

## 2018-08-30 DIAGNOSIS — H43823 Vitreomacular adhesion, bilateral: Secondary | ICD-10-CM

## 2018-08-30 DIAGNOSIS — H43393 Other vitreous opacities, bilateral: Secondary | ICD-10-CM

## 2018-08-30 DIAGNOSIS — Z961 Presence of intraocular lens: Secondary | ICD-10-CM

## 2018-08-30 MED ORDER — BEVACIZUMAB CHEMO INJECTION 1.25MG/0.05ML SYRINGE FOR KALEIDOSCOPE
1.2500 mg | INTRAVITREAL | Status: AC
  Administered 2018-08-30: 1.25 mg via INTRAVITREAL

## 2018-10-04 NOTE — Progress Notes (Addendum)
[facility] - Clinic Note  10/11/2018     CHIEF COMPLAINT Patient presents for Retina Follow Up   HISTORY OF PRESENT ILLNESS: Lindsay Harvey is a 82 y.o. female who presents to the clinic today for:   HPI    Retina Follow Up    Patient presents with  Wet AMD.  In right eye.  This started 5 months ago.  Severity is mild.  Since onset it is stable.  I, the attending physician,  performed the HPI with the patient and updated documentation appropriately.          Comments    F/U EXU AMD OD.Patient states her vision has been "ok", denies new visual onsets/issues. Pt is using Refresh, and Systane PRN.        Last edited by Rennis ChrisZamora, Eveny Anastas, MD on 10/11/2018  1:05 PM. (History)    pt is with her interpreter today, she states she feels like her vision is the same, but her eyes are very dry, pts daughter states that they saw Dr. Alben SpittleWeaver on Friday and he recommended a laser sx for her left eye   Referring physician: Pearson GrippeKim, James, MD [address] [place], [place] [id number]  HISTORICAL INFORMATION:   Selected notes from the MEDICAL RECORD NUMBER Referred by Dr. Marcha Solders. [name] for concern of AMD OU, CNV;  LEE- 11.20.18 (C. [name]) [BCVA OD: 20/40 OS 20/30] Ocular Hx- DES OU, pseudophakia OU, keratoconjunctivitis sicca OU PMH- high chol.    CURRENT MEDICATIONS: Current Outpatient Medications (Ophthalmic Drugs)  Medication Sig  . carboxymethylcellulose (REFRESH PLUS) 0.5 % SOLN 1 drop 3 (three) times daily as needed.   No current facility-administered medications for this visit.  (Ophthalmic Drugs)   Current Outpatient Medications (Other)  Medication Sig  . calcium-vitamin D 250-100 MG-UNIT tablet Take 1 tablet by mouth 2 (two) times daily.  . Multiple Vitamins-Minerals (PRESERVISION AREDS 2 PO) Take by mouth.  . simvastatin (ZOCOR) 40 MG tablet Take 40 mg by mouth daily.   Current Facility-Administered Medications (Other)  Medication Route  . Bevacizumab  (AVASTIN) SOLN 1.25 mg Intravitreal  . Bevacizumab (AVASTIN) SOLN 1.25 mg Intravitreal  . Bevacizumab (AVASTIN) SOLN 1.25 mg Intravitreal  . Bevacizumab (AVASTIN) SOLN 1.25 mg Intravitreal  . Bevacizumab (AVASTIN) SOLN 1.25 mg Intravitreal  . Bevacizumab (AVASTIN) SOLN 1.25 mg Intravitreal  . Bevacizumab (AVASTIN) SOLN 1.25 mg Intravitreal  . Bevacizumab (AVASTIN) SOLN 1.25 mg Intravitreal  . Bevacizumab (AVASTIN) SOLN 1.25 mg Intravitreal  . Bevacizumab (AVASTIN) SOLN 1.25 mg Intravitreal  . Bevacizumab (AVASTIN) SOLN 1.25 mg Intravitreal  . Bevacizumab (AVASTIN) SOLN 1.25 mg Intravitreal      REVIEW OF SYSTEMS: ROS    Positive for: Eyes   Negative for: Constitutional, Gastrointestinal, Neurological, Skin, Genitourinary, Musculoskeletal, HENT, Endocrine, Cardiovascular, Respiratory, Psychiatric, Allergic/Imm, Heme/Lymph   Last edited by Eldridge ScotKendrick, Glenda, LPN on 60/45/409812/30/2019  8:20 AM. (History)    Through interp pt states    ALLERGIES No Known Allergies  PAST MEDICAL HISTORY Past Medical History:  Diagnosis Date  . Cataract 2013   OU   Past Surgical History:  Procedure Laterality Date  . CATARACT EXTRACTION     2013  . DILATION AND CURETTAGE OF UTERUS    . EYE SURGERY      FAMILY HISTORY Family History  Problem Relation Age of Onset  . Stroke Father     SOCIAL HISTORY Social History   Tobacco Use  . Smoking status: Never Smoker  . Smokeless  tobacco: Never Used  Substance Use Topics  . Alcohol use: No    Frequency: Never  . Drug use: No         OPHTHALMIC EXAM:  Base Eye Exam    Visual Acuity (Snellen - Linear)      Right Left   Dist [place] 20/70 20/40 -1   Dist ph [place] 20/70 +2 NI       Tonometry (Tonopen, 8:26 AM)      Right Left   Pressure 17 15       Pupils      Dark Light Shape React APD   Right 3 2 Round Brisk None   Left 3 2 Round Brisk None       Visual Fields (Counting fingers)      Left Right    Full Full       Extraocular  Movement      Right Left    Full, Ortho Full, Ortho       Neuro/Psych    Oriented x3:  Yes   Mood/Affect:  Normal       Dilation    Both eyes:  1.0% Mydriacyl, 2.5% Phenylephrine @ 8:26 AM        Slit Lamp and Fundus Exam    Slit Lamp Exam      Right Left   Lids/Lashes Dermatochalasis - upper lid Dermatochalasis - upper lid   Conjunctiva/Sclera White and quiet White and quiet   Cornea Arcus, 1+ Punctate epithelial erosions  Arcus, Inferior 1+ Punctate epithelial erosions   Anterior Chamber Deep and quiet Deep and quiet   Iris Round and dilated Round and dilated   Lens PC IOL in good postion with anterior capsular phimosis, 1+ Posterior capsular opacification PC IOL in good postion with anterior capsular phimosis, 1+ Posterior capsular opacification sparing center   Vitreous syneresis Vitreous syneresis       Fundus Exam      Right Left   Disc Pink and Sharp Normal   C/D Ratio 0.5 0.6   Macula Blunted foveal reflex, mild ERM, +refractile drusen, Retinal pigment epithelial mottling and atrophy centrally, Stable improvement in PED, No heme. Interval increase in cystic changes from VMT Blunted foveal reflex, Flat, Drusen, early Atrophy, RPE mottling, clumping, and atrophy +PEDs, No heme   Vessels Vascular attenuation Vascular attenuation   Periphery Attached, scattered peripheral drusen, peripapillary drusen  Attached with peripheral drusen          IMAGING AND PROCEDURES  Imaging and Procedures for 02/09/18  OCT, Retina - OU - Both Eyes       Right Eye Quality was good. Central Foveal Thickness: 521. Progression has worsened. Findings include abnormal foveal contour, retinal drusen , intraretinal fluid, pigment epithelial detachment, vitreous traction, no SRF (Interval increase in IRF/VMT/macular cyst ).   Left Eye Quality was good. Central Foveal Thickness: 295. Progression has been stable. Findings include vitreous traction, pigment epithelial detachment, abnormal  foveal contour, no IRF, no SRF, retinal drusen , epiretinal membrane (Trace cystic changes below VMT).   Notes Images taken, stored on drive  Diagnosis / Impression:  OD: Exudative ARMD OD; VMT w/ interval increase in macular cyst / IRF OD OS: Nonexudative ARMD OS; VMT OS stable  Clinical management:  See below  Abbreviations: NFP - Normal foveal profile. CME - cystoid macular edema. PED - pigment epithelial detachment. IRF - intraretinal fluid. SRF - subretinal fluid. EZ - ellipsoid zone. ERM - epiretinal membrane. ORA - outer retinal atrophy.  ORT - outer retinal tubulation. SRHM - subretinal hyper-reflective material         Intravitreal Injection, Pharmacologic Agent - OD - Right Eye       Time Out 10/11/2018. 9:47 AM. Confirmed correct patient, procedure, site, and patient consented.   Anesthesia Topical anesthesia was used. Anesthetic medications included Lidocaine 2%, Proparacaine 0.5%.   Procedure Preparation included 5% betadine to ocular surface, eyelid speculum. A 30 gauge needle was used.   Injection:  1.25 mg Bevacizumab (AVASTIN) SOLN   NDC: 16109-604-54, Lot: [id number] , Expiration date: 12/03/2018   Route: Intravitreal, Site: Right Eye, Waste: 0 mL  Post-op Post injection exam found visual acuity of at least counting fingers. The patient tolerated the procedure well. There were no complications. The patient received written and verbal post procedure care education.                 ASSESSMENT/PLAN:    ICD-10-CM   1. Exudative age-related macular degeneration of right eye with active choroidal neovascularization (HCC) H35.3211 Intravitreal Injection, Pharmacologic Agent - OD - Right Eye    Bevacizumab (AVASTIN) SOLN 1.25 mg  2. 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 Pseudophakia of both eyes Z96.1   5. 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  Retinal edema H35.81 OCT, Retina - OU - Both Eyes    1. 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    - S/P IVA OD #1 (12.03.18), #2 (01.02.19), #3 (01.30.19), #4 (03.01.19), #5 (04.01.19), #6 (04.30.19), #7 (06.05.19), #8 (07.19.19), #9 (08.30.19), #10 (10.14.19), #11 (11.18.19)  - has had good response to IVA  - intraretinal heme improved on exam  - OCT today shows stable improvement in PED and no SRF  - BCVA down today -- 20/70 from 20/40 -- likely from VMT  - FA 04.01.19 shows mild CNVM OD consistent with exudative ARMD  - recommend IVA OD #12 today, (12.30.19)   - pt wishes to be treated with IVA  - RBA of procedure discussed, questions answered  - informed consent obtained and signed  - see procedure note  - f/u in 6 weeks  2. Age related macular degeneration, non-exudative, OS  - The incidence, anatomy, and pathology of dry AMD, risk of progression, and the AREDS and AREDS 2 study including smoking risks discussed with patient.  - Continue amsler grid monitoring  - will continue to monitor for conversion to exudative ARMD  3. 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 - VMT appears to be worsening OD -- macular cyst increasing in volume and  - BCVA OD 20/70 from 20/40 today - OS stable - discussed possibility of surgery should VMT worsen vision or if macular hole develops - also discussed possibility of VMT release - will continue to monitor for now - f/u 6 wks  4. Pseudophakia OU  - s/p CE/IOL OU  - beautiful surgery, doing well  - mild PCO OU  - clear from a retina standpoint to proceed with YAG cap when pt and surgeon are ready -- will send note to Dr. Alben Spittle  -  monitor  5. Vitreous syneresis OU  Pt with intermittent floaters OU  No frank PVD  on exam  Discussed findings and prognosis  No RT or RD on 360 peripheral exam  Reviewed s/s of RT/RD  Strict return precautions for any such RT/RD signs/symptoms    Ophthalmic Meds Ordered this visit:  Meds ordered this encounter  Medications  . Bevacizumab (AVASTIN) SOLN 1.25 mg       Return in about 6 weeks (around 11/22/2018) for F/U exu ARMD OU, DFE, OCT.  There are no Patient Instructions on file for this visit.   Explained the diagnoses, plan, and follow up with the patient and they expressed understanding.  Patient expressed understanding of the importance of proper follow up care.   This document serves as a record of services personally performed by Karie ChimeraBrian G. Luan Maberry, MD, PhD. It was created on their behalf by Laurian BrimAmanda Brown, OA, an ophthalmic assistant. The creation of this record is the provider's dictation and/or activities during the visit.    Electronically signed by: Laurian BrimAmanda Brown, OA  12.23.19 1:06 PM    Karie ChimeraBrian G. Allan Minotti, M.D., Ph.D. Diseases & Surgery of the Retina and Vitreous [facility]  I have reviewed the above documentation for accuracy and completeness, and I agree with the above. Karie ChimeraBrian G. Yorel Redder, M.D., Ph.D. 10/11/18 1:06 PM   Abbreviations: M myopia (nearsighted); A astigmatism; H hyperopia (farsighted); P presbyopia; Mrx spectacle prescription;  CTL contact lenses; OD right eye; OS left eye; OU both eyes  XT exotropia; ET esotropia; PEK punctate epithelial keratitis; PEE punctate epithelial erosions; DES dry eye syndrome; MGD meibomian gland dysfunction; ATs artificial tears; PFAT's preservative free artificial tears; NSC nuclear sclerotic cataract; PSC posterior subcapsular cataract; ERM epi-retinal membrane; PVD posterior vitreous detachment; RD retinal detachment; DM diabetes mellitus; DR diabetic retinopathy; NPDR non-proliferative diabetic  retinopathy; PDR proliferative diabetic retinopathy; CSME clinically significant macular edema; DME diabetic macular edema; dbh dot blot hemorrhages; CWS cotton wool spot; POAG primary open angle glaucoma; C/D cup-to-disc ratio; HVF humphrey visual field; GVF goldmann visual field; OCT optical coherence tomography; IOP intraocular pressure; BRVO Branch retinal vein occlusion; CRVO central retinal vein occlusion; CRAO central retinal artery occlusion; BRAO branch retinal artery occlusion; RT retinal tear; SB scleral buckle; PPV pars plana vitrectomy; VH Vitreous hemorrhage; PRP panretinal laser photocoagulation; IVK intravitreal kenalog; VMT vitreomacular traction; MH Macular hole;  NVD neovascularization of the disc; NVE neovascularization elsewhere; AREDS age related eye disease study; ARMD age related macular degeneration; POAG primary open angle glaucoma; EBMD epithelial/anterior basement membrane dystrophy; ACIOL anterior chamber intraocular lens; IOL intraocular lens; PCIOL posterior chamber intraocular lens; Phaco/IOL phacoemulsification with intraocular lens placement; PRK photorefractive keratectomy; LASIK laser assisted in situ keratomileusis; HTN hypertension; DM diabetes mellitus; COPD chronic obstructive pulmonary disease

## 2018-10-08 DIAGNOSIS — M3501 Sicca syndrome with keratoconjunctivitis: Secondary | ICD-10-CM | POA: Diagnosis not present

## 2018-10-08 DIAGNOSIS — H353123 Nonexudative age-related macular degeneration, left eye, advanced atrophic without subfoveal involvement: Secondary | ICD-10-CM | POA: Diagnosis not present

## 2018-10-08 DIAGNOSIS — H04123 Dry eye syndrome of bilateral lacrimal glands: Secondary | ICD-10-CM | POA: Diagnosis not present

## 2018-10-08 DIAGNOSIS — H353211 Exudative age-related macular degeneration, right eye, with active choroidal neovascularization: Secondary | ICD-10-CM | POA: Diagnosis not present

## 2018-10-11 ENCOUNTER — Ambulatory Visit (INDEPENDENT_AMBULATORY_CARE_PROVIDER_SITE_OTHER): Payer: Medicare HMO | Admitting: Ophthalmology

## 2018-10-11 ENCOUNTER — Encounter (INDEPENDENT_AMBULATORY_CARE_PROVIDER_SITE_OTHER): Payer: Self-pay | Admitting: Ophthalmology

## 2018-10-11 DIAGNOSIS — H353211 Exudative age-related macular degeneration, right eye, with active choroidal neovascularization: Secondary | ICD-10-CM | POA: Diagnosis not present

## 2018-10-11 DIAGNOSIS — H43393 Other vitreous opacities, bilateral: Secondary | ICD-10-CM | POA: Diagnosis not present

## 2018-10-11 DIAGNOSIS — H353121 Nonexudative age-related macular degeneration, left eye, early dry stage: Secondary | ICD-10-CM

## 2018-10-11 DIAGNOSIS — Z961 Presence of intraocular lens: Secondary | ICD-10-CM | POA: Diagnosis not present

## 2018-10-11 DIAGNOSIS — H3581 Retinal edema: Secondary | ICD-10-CM

## 2018-10-11 DIAGNOSIS — H43823 Vitreomacular adhesion, bilateral: Secondary | ICD-10-CM | POA: Diagnosis not present

## 2018-10-11 MED ORDER — BEVACIZUMAB CHEMO INJECTION 1.25MG/0.05ML SYRINGE FOR KALEIDOSCOPE
1.2500 mg | INTRAVITREAL | Status: AC
  Administered 2018-10-11: 1.25 mg via INTRAVITREAL

## 2018-11-17 NOTE — Progress Notes (Addendum)
[facility] - Clinic Note  11/22/2018     CHIEF COMPLAINT Patient presents for Retina Follow Up   HISTORY OF PRESENT ILLNESS: Lindsay Harvey is a 83 y.o. female who presents to the clinic today for:   HPI    Retina Follow Up    Patient presents with  Wet AMD.  In right eye.  Severity is moderate.  Duration of 6 weeks.  Since onset it is stable.  I, the attending physician,  performed the HPI with the patient and updated documentation appropriately.          Comments    Patient states vision the same OU.       Last edited by Rennis Chris, MD on 11/22/2018  8:34 AM. (History)       Referring physician: Pearson Grippe, MD [address] [place], [place] [address]  HISTORICAL INFORMATION:   Selected notes from the MEDICAL RECORD NUMBER Referred by Dr. Marcha Solders for concern of AMD OU, CNV;  LEE- 11.20.18 (C. [name]) [BCVA OD: 20/40 OS 20/30] Ocular Hx- DES OU, pseudophakia OU, keratoconjunctivitis sicca OU PMH- high chol.    CURRENT MEDICATIONS: Current Outpatient Medications (Ophthalmic Drugs)  Medication Sig  . carboxymethylcellulose (REFRESH PLUS) 0.5 % SOLN 1 drop 3 (three) times daily as needed.   No current facility-administered medications for this visit.  (Ophthalmic Drugs)   Current Outpatient Medications (Other)  Medication Sig  . calcium-vitamin D 250-100 MG-UNIT tablet Take 1 tablet by mouth 2 (two) times daily.  . Multiple Vitamins-Minerals (PRESERVISION AREDS 2 PO) Take by mouth.  . simvastatin (ZOCOR) 40 MG tablet Take 40 mg by mouth daily.   Current Facility-Administered Medications (Other)  Medication Route  . Bevacizumab (AVASTIN) SOLN 1.25 mg Intravitreal  . Bevacizumab (AVASTIN) SOLN 1.25 mg Intravitreal  . Bevacizumab (AVASTIN) SOLN 1.25 mg Intravitreal  . Bevacizumab (AVASTIN) SOLN 1.25 mg Intravitreal  . Bevacizumab (AVASTIN) SOLN 1.25 mg Intravitreal  . Bevacizumab (AVASTIN) SOLN 1.25 mg Intravitreal  .  Bevacizumab (AVASTIN) SOLN 1.25 mg Intravitreal  . Bevacizumab (AVASTIN) SOLN 1.25 mg Intravitreal  . Bevacizumab (AVASTIN) SOLN 1.25 mg Intravitreal  . Bevacizumab (AVASTIN) SOLN 1.25 mg Intravitreal  . Bevacizumab (AVASTIN) SOLN 1.25 mg Intravitreal  . Bevacizumab (AVASTIN) SOLN 1.25 mg Intravitreal  . Bevacizumab (AVASTIN) SOLN 1.25 mg Intravitreal      REVIEW OF SYSTEMS: ROS    Positive for: Eyes   Negative for: Constitutional, Gastrointestinal, Neurological, Skin, Genitourinary, Musculoskeletal, HENT, Endocrine, Cardiovascular, Respiratory, Psychiatric, Allergic/Imm, Heme/Lymph   Last edited by Annalee Genta D on 11/22/2018  7:52 AM. (History)    Through interp pt states    ALLERGIES No Known Allergies  PAST MEDICAL HISTORY Past Medical History:  Diagnosis Date  . Cataract 2013   OU   Past Surgical History:  Procedure Laterality Date  . CATARACT EXTRACTION     2013  . DILATION AND CURETTAGE OF UTERUS    . EYE SURGERY      FAMILY HISTORY Family History  Problem Relation Age of Onset  . Stroke Father     SOCIAL HISTORY Social History   Tobacco Use  . Smoking status: Never Smoker  . Smokeless tobacco: Never Used  Substance Use Topics  . Alcohol use: No    Frequency: Never  . Drug use: No         OPHTHALMIC EXAM:  Base Eye Exam    Visual Acuity (Snellen - Linear)      Right  Left   Dist cc 20/50 -2 20/30 -2   Dist ph cc NI NI   Correction:  Glasses       Tonometry (Tonopen, 8:04 AM)      Right Left   Pressure 14 15       Pupils      Dark Light Shape React APD   Right 3 2 Round Brisk None   Left 3 2 Round Brisk None       Visual Fields (Counting fingers)      Left Right     Full       Extraocular Movement      Right Left    Full, Ortho Full, Ortho       Neuro/Psych    Oriented x3:  Yes   Mood/Affect:  Normal       Dilation    Both eyes:  1.0% Mydriacyl, 2.5% Phenylephrine @ 8:04 AM        Slit Lamp and Fundus Exam     Slit Lamp Exam      Right Left   Lids/Lashes Dermatochalasis - upper lid Dermatochalasis - upper lid   Conjunctiva/Sclera White and quiet White and quiet   Cornea Arcus, 1+ Punctate epithelial erosions  Arcus, Inferior 1+ Punctate epithelial erosions   Anterior Chamber Deep and quiet Deep and quiet   Iris Round and dilated Round and dilated   Lens PC IOL in good postion with anterior capsular phimosis, 1+ Posterior capsular opacification PC IOL in good postion with anterior capsular phimosis, 1+ Posterior capsular opacification sparing center   Vitreous syneresis Vitreous syneresis       Fundus Exam      Right Left   Disc Pink and Sharp Normal   C/D Ratio 0.5 0.6   Macula Blunted foveal reflex, mild ERM, +refractile drusen, Retinal pigment epithelial mottling and atrophy centrally, Stable improvement in PED, No heme. Interval increase in cystic changes from VMT Blunted foveal reflex, Flat, Drusen, early Atrophy, RPE mottling, clumping, and atrophy +PEDs, No heme   Vessels Vascular attenuation Vascular attenuation   Periphery Attached, scattered peripheral drusen, peripapillary drusen  Attached with peripheral drusen        Refraction    Wearing Rx      Sphere Cylinder Axis Add   Right -1.00 +0.75 180 +2.75   Left -0.50 +0.75 004 +2.75       Manifest Refraction      Sphere Cylinder Axis Dist VA   Right -1.25 +0.50 180 20/50-2   Left -0.50 +0.75 005 20/30-2          IMAGING AND PROCEDURES  Imaging and Procedures for 02/09/18  OCT, Retina - OU - Both Eyes       Right Eye Quality was good. Central Foveal Thickness: 554. Progression has worsened. Findings include abnormal foveal contour, retinal drusen , intraretinal fluid, pigment epithelial detachment, vitreous traction, no SRF (Interval increase in IRF/VMT/macular cyst ).   Left Eye Quality was good. Central Foveal Thickness: 285. Progression has been stable. Findings include vitreous traction, pigment epithelial  detachment, abnormal foveal contour, no IRF, no SRF, retinal drusen , epiretinal membrane (Trace cystic changes below VMT).   Notes Images taken, stored on drive  Diagnosis / Impression:  OD: Exudative ARMD OD; VMT w/ interval increase in macular cyst / IRF OD OS: Nonexudative ARMD OS; VMT OS stable  Clinical management:  See below  Abbreviations: NFP - Normal foveal profile. CME - cystoid macular edema. PED - pigment  epithelial detachment. IRF - intraretinal fluid. SRF - subretinal fluid. EZ - ellipsoid zone. ERM - epiretinal membrane. ORA - outer retinal atrophy. ORT - outer retinal tubulation. SRHM - subretinal hyper-reflective material         Intravitreal Injection, Pharmacologic Agent - OD - Right Eye       Time Out 11/22/2018. 8:39 AM. Confirmed correct patient, procedure, site, and patient consented.   Anesthesia Topical anesthesia was used. Anesthetic medications included Lidocaine 2%, Proparacaine 0.5%.   Procedure Preparation included 5% betadine to ocular surface, eyelid speculum. A supplied needle was used.   Injection:  1.25 mg Bevacizumab (AVASTIN) SOLN   NDC: 16109-604-54, Lot: [id number] , Expiration date: 12/06/2018   Route: Intravitreal, Site: Right Eye, Waste: 0 mg  Post-op Post injection exam found visual acuity of at least counting fingers. The patient tolerated the procedure well. There were no complications. The patient received written and verbal post procedure care education.                 ASSESSMENT/PLAN:    ICD-10-CM   1. Exudative age-related macular degeneration of right eye with active choroidal neovascularization (HCC) H35.3211 Intravitreal Injection, Pharmacologic Agent - OD - Right Eye    Bevacizumab (AVASTIN) SOLN 1.25 mg  2. 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 Pseudophakia of both eyes Z96.1   5. 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 Retinal edema H35.81 OCT, Retina - OU - Both Eyes  7. Vitreomacular adhesion of right eye H43.821     1. 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    - S/P IVA OD #1 (12.03.18), #2 (01.02.19), #3 (01.30.19), #4 (03.01.19), #5 (04.01.19), #6 (04.30.19), #7 (06.05.19), #8 (07.19.19), #9 (08.30.19), #10 (10.14.19), #11 (11.18.19), #12 (12.30.19)  - has had good response to IVA  - intraretinal heme improved on exam  - OCT today shows stable improvement in PED and no SRF w/ extension at 6 wks  - BCVA relatively today -- 20/50 from 20/40 -- likely from VMT  - FA 04.01.19 shows mild CNVM OD consistent with exudative ARMD  - recommend IVA OD #13 today, (02.10.20) with extension to 8 wks  - pt wishes to be treated with IVA  - RBA of procedure discussed, questions answered  - informed consent obtained and signed  - see procedure note  - f/u in 8 weeks  2. Age related macular degeneration, non-exudative, OS  - The incidence, anatomy, and pathology of dry AMD, risk of progression, and the AREDS and AREDS 2 study including smoking risks discussed with patient.  - Continue amsler grid monitoring  - will continue to monitor for conversion to exudative ARMD  3. 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 - VMT appears to be worsening OD -- macular cyst increasing in volume and height - BCVA OD 20/50 - OS stable - discussed possibility of surgery should VMT worsen vision or if macular hole develops - also discussed possibility of VMT release - will continue to monitor for now - f/u 8 wks  4. Pseudophakia OU  - s/p CE/IOL OU  - s/p YAG OU  - beautiful surgeries, doing  well  - monitor  5. Vitreous syneresis OU  Pt with intermittent floaters OU  No frank PVD on exam  Discussed findings and prognosis  No RT or RD on 360 peripheral exam  Reviewed s/s of RT/RD  Strict return precautions for any such RT/RD signs/symptoms    Ophthalmic Meds Ordered this visit:  Meds ordered this encounter  Medications  . Bevacizumab (AVASTIN) SOLN 1.25 mg       Return in about 8 weeks (around 01/17/2019) for Dilated Exam, OCT, Possible Injxn.  There are no Patient Instructions on file for this visit.   Explained the diagnoses, plan, and follow up with the patient and they expressed understanding.  Patient expressed understanding of the importance of proper follow up care.   This document serves as a record of services personally performed by Karie ChimeraBrian G. Maryetta Shafer, MD, PhD. It was created on their behalf by Laurian BrimAmanda Brown, OA, an ophthalmic assistant. The creation of this record is the provider's dictation and/or activities during the visit.    Electronically signed by: Laurian BrimAmanda Brown, OA  02.05.2020 8:40 AM    Karie ChimeraBrian G. Donyel Castagnola, M.D., Ph.D. Diseases & Surgery of the Retina and Vitreous [facility]  I have reviewed the above documentation for accuracy and completeness, and I agree with the above. Karie ChimeraBrian G. Savalas Monje, M.D., Ph.D. 11/22/18 8:40 AM    Abbreviations: M myopia (nearsighted); A astigmatism; H hyperopia (farsighted); P presbyopia; Mrx spectacle prescription;  CTL contact lenses; OD right eye; OS left eye; OU both eyes  XT exotropia; ET esotropia; PEK punctate epithelial keratitis; PEE punctate epithelial erosions; DES dry eye syndrome; MGD meibomian gland dysfunction; ATs artificial tears; PFAT's preservative free artificial tears; NSC nuclear sclerotic cataract; PSC posterior subcapsular cataract; ERM epi-retinal membrane; PVD posterior vitreous detachment; RD retinal detachment; DM diabetes mellitus; DR diabetic retinopathy; NPDR  non-proliferative diabetic retinopathy; PDR proliferative diabetic retinopathy; CSME clinically significant macular edema; DME diabetic macular edema; dbh dot blot hemorrhages; CWS cotton wool spot; POAG primary open angle glaucoma; C/D cup-to-disc ratio; HVF humphrey visual field; GVF goldmann visual field; OCT optical coherence tomography; IOP intraocular pressure; BRVO Branch retinal vein occlusion; CRVO central retinal vein occlusion; CRAO central retinal artery occlusion; BRAO branch retinal artery occlusion; RT retinal tear; SB scleral buckle; PPV pars plana vitrectomy; VH Vitreous hemorrhage; PRP panretinal laser photocoagulation; IVK intravitreal kenalog; VMT vitreomacular traction; MH Macular hole;  NVD neovascularization of the disc; NVE neovascularization elsewhere; AREDS age related eye disease study; ARMD age related macular degeneration; POAG primary open angle glaucoma; EBMD epithelial/anterior basement membrane dystrophy; ACIOL anterior chamber intraocular lens; IOL intraocular lens; PCIOL posterior chamber intraocular lens; Phaco/IOL phacoemulsification with intraocular lens placement; PRK photorefractive keratectomy; LASIK laser assisted in situ keratomileusis; HTN hypertension; DM diabetes mellitus; COPD chronic obstructive pulmonary disease

## 2018-11-22 ENCOUNTER — Ambulatory Visit (INDEPENDENT_AMBULATORY_CARE_PROVIDER_SITE_OTHER): Payer: Medicare HMO | Admitting: Ophthalmology

## 2018-11-22 ENCOUNTER — Encounter (INDEPENDENT_AMBULATORY_CARE_PROVIDER_SITE_OTHER): Payer: Self-pay | Admitting: Ophthalmology

## 2018-11-22 DIAGNOSIS — H3581 Retinal edema: Secondary | ICD-10-CM | POA: Diagnosis not present

## 2018-11-22 DIAGNOSIS — H43821 Vitreomacular adhesion, right eye: Secondary | ICD-10-CM | POA: Diagnosis not present

## 2018-11-22 DIAGNOSIS — H353211 Exudative age-related macular degeneration, right eye, with active choroidal neovascularization: Secondary | ICD-10-CM

## 2018-11-22 DIAGNOSIS — H353121 Nonexudative age-related macular degeneration, left eye, early dry stage: Secondary | ICD-10-CM | POA: Diagnosis not present

## 2018-11-22 DIAGNOSIS — Z961 Presence of intraocular lens: Secondary | ICD-10-CM | POA: Diagnosis not present

## 2018-11-22 DIAGNOSIS — H43393 Other vitreous opacities, bilateral: Secondary | ICD-10-CM | POA: Diagnosis not present

## 2018-11-22 DIAGNOSIS — H43823 Vitreomacular adhesion, bilateral: Secondary | ICD-10-CM

## 2018-11-22 MED ORDER — BEVACIZUMAB CHEMO INJECTION 1.25MG/0.05ML SYRINGE FOR KALEIDOSCOPE
1.2500 mg | INTRAVITREAL | Status: AC
  Administered 2018-11-22: 1.25 mg via INTRAVITREAL

## 2019-01-11 NOTE — Progress Notes (Signed)
[facility] - Clinic Note  01/17/2019     CHIEF COMPLAINT Patient presents for Retina Follow Up   HISTORY OF PRESENT ILLNESS: Lindsay Harvey is a 83 y.o. female who presents to the clinic today for:   HPI    Retina Follow Up    Patient presents with  Wet AMD.  In right eye.  This started months ago.  Severity is moderate.  Duration of 8 weeks.  Since onset it is stable.  I, the attending physician,  performed the HPI with the patient and updated documentation appropriately.          Comments    83 y/o female pt here for 8 wk f/u for exu ARMD w/active CNV OD.  No change in [place] OU.  Denies pain, flashes, floaters.  AT prn OU.       Last edited by Rennis Chris, MD on 01/17/2019  9:00 AM. (History)    pt states she does not feel like her vision has changed much from last appt   Referring physician: Pearson Grippe, MD [address] [place], [place] [address]  HISTORICAL INFORMATION:   Selected notes from the MEDICAL RECORD NUMBER Referred by Dr. Marcha Solders for concern of AMD OU, CNV;  LEE- 11.20.18 (C. [name]) [BCVA OD: 20/40 OS 20/30] Ocular Hx- DES OU, pseudophakia OU, keratoconjunctivitis sicca OU PMH- high chol.    CURRENT MEDICATIONS: Current Outpatient Medications (Ophthalmic Drugs)  Medication Sig  . carboxymethylcellulose (REFRESH PLUS) 0.5 % SOLN 1 drop 3 (three) times daily as needed.   No current facility-administered medications for this visit.  (Ophthalmic Drugs)   Current Outpatient Medications (Other)  Medication Sig  . calcium-vitamin D 250-100 MG-UNIT tablet Take 1 tablet by mouth 2 (two) times daily.  . Multiple Vitamins-Minerals (PRESERVISION AREDS 2 PO) Take by mouth.  . simvastatin (ZOCOR) 40 MG tablet Take 40 mg by mouth daily.   Current Facility-Administered Medications (Other)  Medication Route  . Bevacizumab (AVASTIN) SOLN 1.25 mg Intravitreal  . Bevacizumab (AVASTIN) SOLN 1.25 mg Intravitreal  . Bevacizumab (AVASTIN)  SOLN 1.25 mg Intravitreal  . Bevacizumab (AVASTIN) SOLN 1.25 mg Intravitreal  . Bevacizumab (AVASTIN) SOLN 1.25 mg Intravitreal  . Bevacizumab (AVASTIN) SOLN 1.25 mg Intravitreal  . Bevacizumab (AVASTIN) SOLN 1.25 mg Intravitreal  . Bevacizumab (AVASTIN) SOLN 1.25 mg Intravitreal  . Bevacizumab (AVASTIN) SOLN 1.25 mg Intravitreal  . Bevacizumab (AVASTIN) SOLN 1.25 mg Intravitreal  . Bevacizumab (AVASTIN) SOLN 1.25 mg Intravitreal  . Bevacizumab (AVASTIN) SOLN 1.25 mg Intravitreal  . Bevacizumab (AVASTIN) SOLN 1.25 mg Intravitreal      REVIEW OF SYSTEMS: ROS    Positive for: Gastrointestinal, Eyes   Negative for: Constitutional, Neurological, Skin, Genitourinary, Musculoskeletal, HENT, Endocrine, Cardiovascular, Respiratory, Psychiatric, Allergic/Imm, Heme/Lymph   Last edited by [name], COA on 01/17/2019  8:31 AM. (History)    Through interp pt states    ALLERGIES No Known Allergies  PAST MEDICAL HISTORY Past Medical History:  Diagnosis Date  . Cataract 2013   OU  . Macular degeneration    Wet OD, Dry OS   Past Surgical History:  Procedure Laterality Date  . CATARACT EXTRACTION     2013  . DILATION AND CURETTAGE OF UTERUS    . EYE SURGERY      FAMILY HISTORY Family History  Problem Relation Age of Onset  . Stroke Father     SOCIAL HISTORY Social History   Tobacco Use  . Smoking status:  Never Smoker  . Smokeless tobacco: Never Used  Substance Use Topics  . Alcohol use: No    Frequency: Never  . Drug use: No         OPHTHALMIC EXAM:  Base Eye Exam    Visual Acuity (Snellen - Linear)      Right Left   Dist Luke 20/70 20/40   Dist ph [name] 20/60 20/25 -2       Tonometry (Tonopen, 8:33 AM)      Right Left   Pressure 14 17       Pupils      Dark Light Shape React APD   Right 3 2 Round Brisk None   Left 3 2 Round Brisk None       Visual Fields (Counting fingers)      Left Right    Full Full       Extraocular Movement      Right Left     Full, Ortho Full, Ortho       Neuro/Psych    Oriented x3:  Yes   Mood/Affect:  Normal       Dilation    Both eyes:  1.0% Mydriacyl, 2.5% Phenylephrine @ 8:33 AM        Slit Lamp and Fundus Exam    Slit Lamp Exam      Right Left   Lids/Lashes Dermatochalasis - upper lid Dermatochalasis - upper lid   Conjunctiva/Sclera White and quiet White and quiet   Cornea Arcus, 1+ Punctate epithelial erosions  Arcus, Inferior 1+ Punctate epithelial erosions   Anterior Chamber Deep and quiet Deep and quiet   Iris Round and dilated Round and dilated   Lens PC IOL in good postion with anterior capsular phimosis, 1+ Posterior capsular opacification PC IOL in good postion with anterior capsular phimosis, 1+ Posterior capsular opacification sparing center   Vitreous syneresis Vitreous syneresis       Fundus Exam      Right Left   Disc Pink and Sharp Normal   C/D Ratio 0.5 0.6   Macula Blunted foveal reflex, mild ERM, +refractile drusen, Retinal pigment epithelial mottling and atrophy centrally, Stable improvement in PED, No heme. Interval increase in macular cyst from VMT Blunted foveal reflex, Flat, Drusen, early Atrophy, RPE mottling, clumping, and atrophy +PEDs, No heme   Vessels Vascular attenuation Vascular attenuation   Periphery Attached, scattered peripheral drusen, peripapillary drusen  Attached with peripheral drusen          IMAGING AND PROCEDURES  Imaging and Procedures for 02/09/18  OCT, Retina - OU - Both Eyes       Right Eye Quality was good. Central Foveal Thickness: 650. Progression has worsened. Findings include abnormal foveal contour, retinal drusen , intraretinal fluid, pigment epithelial detachment, vitreous traction, no SRF (Interval increase in IRF/VMT/macular cyst ).   Left Eye Quality was good. Central Foveal Thickness: 278. Progression has been stable. Findings include vitreous traction, pigment epithelial detachment, abnormal foveal contour, no IRF, no SRF,  retinal drusen , epiretinal membrane (Trace cystic changes below VMT).   Notes Images taken, stored on drive  Diagnosis / Impression:  OD: Exudative ARMD OD; VMT w/ interval increase in macular cyst / IRF OD OS: Nonexudative ARMD OS; VMT OS stable  Clinical management:  See below  Abbreviations: NFP - Normal foveal profile. CME - cystoid macular edema. PED - pigment epithelial detachment. IRF - intraretinal fluid. SRF - subretinal fluid. EZ - ellipsoid zone. ERM - epiretinal membrane. ORA -  outer retinal atrophy. ORT - outer retinal tubulation. SRHM - subretinal hyper-reflective material         Intravitreal Injection, Pharmacologic Agent - OD - Right Eye       Time Out 01/17/2019. 9:48 AM. Confirmed correct patient, procedure, site, and patient consented.   Anesthesia Topical anesthesia was used. Anesthetic medications included Lidocaine 2%, Proparacaine 0.5%.   Procedure Preparation included 5% betadine to ocular surface, eyelid speculum. A supplied needle was used.   Injection:  1.25 mg Bevacizumab (AVASTIN) SOLN   NDC: 33825-053-97, Lot: [id number] , Expiration date: 03/12/2019   Route: Intravitreal, Site: Right Eye, Waste: 0 mL  Post-op Post injection exam found visual acuity of at least counting fingers. The patient tolerated the procedure well. There were no complications. The patient received written and verbal post procedure care education.                 ASSESSMENT/PLAN:    ICD-10-CM   1. Exudative age-related macular degeneration of right eye with active choroidal neovascularization (HCC) H35.3211 Intravitreal Injection, Pharmacologic Agent - OD - Right Eye    Bevacizumab (AVASTIN) SOLN 1.25 mg  2. 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 Pseudophakia of both eyes Z96.1   5. 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 Retinal edema H35.81 OCT, Retina - OU -  Both Eyes  7. Vitreomacular adhesion of right eye H43.821     1. 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    - S/P IVA OD #1 (12.03.18), #2 (01.02.19), #3 (01.30.19), #4 (03.01.19), #5 (04.01.19), #6 (04.30.19), #7 (06.05.19), #8 (07.19.19), #9 (08.30.19), #10 (10.14.19), #11 (11.18.19), #12 (12.30.19), #13 (02.10.20)  - has had good response to IVA  - intraretinal heme improved on exam  - OCT today shows stable improvement in PED and no SRF w/ extension to 8 wks  - BCVA 20/60 from 20/50 -- mostly from VMT and increase in macular cyst  - FA 04.01.19 shows mild CNVM OD consistent with exudative ARMD  - recommend IVA OD #14 today, (04.06.20), mostly for maintenance  - pt wishes to be treated with IVA  - RBA of procedure discussed, questions answered  - informed consent obtained and signed  - see procedure note  - f/u in 8 weeks  2. Age related macular degeneration, non-exudative, OS  - The incidence, anatomy, and pathology of dry AMD, risk of progression, and the AREDS and AREDS 2 study including smoking risks discussed with patient.  - Continue amsler grid monitoring  - will continue to monitor for conversion to exudative ARMD  3. 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 - VMT appears to be worsening OD -- macular cyst increasing in volume and height - BCVA OD 20/60 from 20/50 - OS stable - discussed possibility of surgery should VMT worsen vision or if macular hole develops - also discussed possibility of VMT release - will continue to monitor for now - f/u 8 wks  4. Pseudophakia OU  - s/p CE/IOL OU  - s/p YAG OU  - beautiful surgeries, doing well  - monitor  5.  Vitreous syneresis OU  Pt with intermittent floaters OU  No frank PVD on exam  Discussed findings and prognosis  No RT or RD on 360 peripheral exam  Reviewed s/s of RT/RD  Strict return precautions for any such RT/RD signs/symptoms   Ophthalmic Meds Ordered this visit:  Meds ordered this encounter  Medications  . Bevacizumab (AVASTIN) SOLN 1.25 mg       Return in about 8 weeks (around 03/14/2019) for f/u exu ARMD, DFE, OCT.  There are no Patient Instructions on file for this visit.   Explained the diagnoses, plan, and follow up with the patient and they expressed understanding.  Patient expressed understanding of the importance of proper follow up care.   This document serves as a record of services personally performed by Karie Chimera, MD, PhD. It was created on their behalf by Laurian Brim, OA, an ophthalmic assistant. The creation of this record is the provider's dictation and/or activities during the visit.    Electronically signed by: Laurian Brim, OA  03.31.2020 12:49 PM     Karie Chimera, M.D., Ph.D. Diseases & Surgery of the Retina and Vitreous [facility]  I have reviewed the above documentation for accuracy and completeness, and I agree with the above. Karie Chimera, M.D., Ph.D. 01/17/19 12:50 PM     Abbreviations: M myopia (nearsighted); A astigmatism; H hyperopia (farsighted); P presbyopia; Mrx spectacle prescription;  CTL contact lenses; OD right eye; OS left eye; OU both eyes  XT exotropia; ET esotropia; PEK punctate epithelial keratitis; PEE punctate epithelial erosions; DES dry eye syndrome; MGD meibomian gland dysfunction; ATs artificial tears; PFAT's preservative free artificial tears; NSC nuclear sclerotic cataract; PSC posterior subcapsular cataract; ERM epi-retinal membrane; PVD posterior vitreous detachment; RD retinal detachment; DM diabetes mellitus; DR diabetic retinopathy; NPDR non-proliferative diabetic retinopathy; PDR  proliferative diabetic retinopathy; CSME clinically significant macular edema; DME diabetic macular edema; dbh dot blot hemorrhages; CWS cotton wool spot; POAG primary open angle glaucoma; C/D cup-to-disc ratio; HVF humphrey visual field; GVF goldmann visual field; OCT optical coherence tomography; IOP intraocular pressure; BRVO Branch retinal vein occlusion; CRVO central retinal vein occlusion; CRAO central retinal artery occlusion; BRAO branch retinal artery occlusion; RT retinal tear; SB scleral buckle; PPV pars plana vitrectomy; VH Vitreous hemorrhage; PRP panretinal laser photocoagulation; IVK intravitreal kenalog; VMT vitreomacular traction; MH Macular hole;  NVD neovascularization of the disc; NVE neovascularization elsewhere; AREDS age related eye disease study; ARMD age related macular degeneration; POAG primary open angle glaucoma; EBMD epithelial/anterior basement membrane dystrophy; ACIOL anterior chamber intraocular lens; IOL intraocular lens; PCIOL posterior chamber intraocular lens; Phaco/IOL phacoemulsification with intraocular lens placement; PRK photorefractive keratectomy; LASIK laser assisted in situ keratomileusis; HTN hypertension; DM diabetes mellitus; COPD chronic obstructive pulmonary disease

## 2019-01-17 ENCOUNTER — Ambulatory Visit (INDEPENDENT_AMBULATORY_CARE_PROVIDER_SITE_OTHER): Payer: Medicare HMO | Admitting: Ophthalmology

## 2019-01-17 ENCOUNTER — Encounter (INDEPENDENT_AMBULATORY_CARE_PROVIDER_SITE_OTHER): Payer: Self-pay | Admitting: Ophthalmology

## 2019-01-17 ENCOUNTER — Other Ambulatory Visit: Payer: Self-pay

## 2019-01-17 DIAGNOSIS — H353121 Nonexudative age-related macular degeneration, left eye, early dry stage: Secondary | ICD-10-CM

## 2019-01-17 DIAGNOSIS — H43821 Vitreomacular adhesion, right eye: Secondary | ICD-10-CM | POA: Diagnosis not present

## 2019-01-17 DIAGNOSIS — Z961 Presence of intraocular lens: Secondary | ICD-10-CM

## 2019-01-17 DIAGNOSIS — H43393 Other vitreous opacities, bilateral: Secondary | ICD-10-CM

## 2019-01-17 DIAGNOSIS — H43823 Vitreomacular adhesion, bilateral: Secondary | ICD-10-CM

## 2019-01-17 DIAGNOSIS — H3581 Retinal edema: Secondary | ICD-10-CM | POA: Diagnosis not present

## 2019-01-17 DIAGNOSIS — H353211 Exudative age-related macular degeneration, right eye, with active choroidal neovascularization: Secondary | ICD-10-CM

## 2019-01-17 MED ORDER — BEVACIZUMAB CHEMO INJECTION 1.25MG/0.05ML SYRINGE FOR KALEIDOSCOPE
1.2500 mg | INTRAVITREAL | Status: AC | PRN
  Administered 2019-01-17: 1.25 mg via INTRAVITREAL

## 2019-02-09 DIAGNOSIS — H3530 Unspecified macular degeneration: Secondary | ICD-10-CM | POA: Diagnosis not present

## 2019-02-09 DIAGNOSIS — G8929 Other chronic pain: Secondary | ICD-10-CM | POA: Diagnosis not present

## 2019-02-09 DIAGNOSIS — E785 Hyperlipidemia, unspecified: Secondary | ICD-10-CM | POA: Diagnosis not present

## 2019-02-09 DIAGNOSIS — K219 Gastro-esophageal reflux disease without esophagitis: Secondary | ICD-10-CM | POA: Diagnosis not present

## 2019-02-09 DIAGNOSIS — K5900 Constipation, unspecified: Secondary | ICD-10-CM | POA: Diagnosis not present

## 2019-03-14 ENCOUNTER — Other Ambulatory Visit: Payer: Self-pay

## 2019-03-14 ENCOUNTER — Encounter (INDEPENDENT_AMBULATORY_CARE_PROVIDER_SITE_OTHER): Payer: Self-pay | Admitting: Ophthalmology

## 2019-03-14 ENCOUNTER — Ambulatory Visit (INDEPENDENT_AMBULATORY_CARE_PROVIDER_SITE_OTHER): Payer: Medicare HMO | Admitting: Ophthalmology

## 2019-03-14 DIAGNOSIS — H353121 Nonexudative age-related macular degeneration, left eye, early dry stage: Secondary | ICD-10-CM

## 2019-03-14 DIAGNOSIS — H43393 Other vitreous opacities, bilateral: Secondary | ICD-10-CM | POA: Diagnosis not present

## 2019-03-14 DIAGNOSIS — H43821 Vitreomacular adhesion, right eye: Secondary | ICD-10-CM

## 2019-03-14 DIAGNOSIS — Z961 Presence of intraocular lens: Secondary | ICD-10-CM

## 2019-03-14 DIAGNOSIS — H353211 Exudative age-related macular degeneration, right eye, with active choroidal neovascularization: Secondary | ICD-10-CM | POA: Diagnosis not present

## 2019-03-14 DIAGNOSIS — H3581 Retinal edema: Secondary | ICD-10-CM | POA: Diagnosis not present

## 2019-03-14 DIAGNOSIS — H43823 Vitreomacular adhesion, bilateral: Secondary | ICD-10-CM | POA: Diagnosis not present

## 2019-03-14 NOTE — Progress Notes (Signed)
[facility] - Clinic Note  03/14/2019     CHIEF COMPLAINT Patient presents for Retina Evaluation   HISTORY OF PRESENT ILLNESS: Lindsay Harvey is a 83 y.o. female who presents to the clinic today for:   HPI    Retina Evaluation    In both eyes.  This started months ago.  Duration of months.  Context:  distance vision and near vision.  I, the attending physician,  performed the HPI with the patient and updated documentation appropriately.          Comments    Patient states she is unsure of how her vision is doing.  Patient has not noticed any recent decreases in her vision. Patient denies eye pain or discomfort.  Patient denies floaters or FOL.  Musician(Translator in room)       Last edited by Rennis ChrisZamora, Joniqua Sidle, MD on 03/14/2019  8:42 AM. (History)    pt states her eye is less painful, but she is unsure if the vision has changed or not   Referring physician: Pearson GrippeKim, James, MD [address] [place] [id number]  HISTORICAL INFORMATION:   Selected notes from the MEDICAL RECORD NUMBER Referred by Dr. Marcha Solders. [name] for concern of AMD OU, CNV;  LEE- 11.20.18 (C. [name]) [BCVA OD: 20/40 OS 20/30] Ocular Hx- DES OU, pseudophakia OU, keratoconjunctivitis sicca OU PMH- high chol.    CURRENT MEDICATIONS: Current Outpatient Medications (Ophthalmic Drugs)  Medication Sig  . carboxymethylcellulose (REFRESH PLUS) 0.5 % SOLN 1 drop 3 (three) times daily as needed.   No current facility-administered medications for this visit.  (Ophthalmic Drugs)   Current Outpatient Medications (Other)  Medication Sig  . calcium-vitamin D 250-100 MG-UNIT tablet Take 1 tablet by mouth 2 (two) times daily.  . Multiple Vitamins-Minerals (PRESERVISION AREDS 2 PO) Take by mouth.  . simvastatin (ZOCOR) 40 MG tablet Take 40 mg by mouth daily.   Current Facility-Administered Medications (Other)  Medication Route  . Bevacizumab (AVASTIN) SOLN 1.25 mg Intravitreal  . Bevacizumab (AVASTIN)  SOLN 1.25 mg Intravitreal  . Bevacizumab (AVASTIN) SOLN 1.25 mg Intravitreal  . Bevacizumab (AVASTIN) SOLN 1.25 mg Intravitreal  . Bevacizumab (AVASTIN) SOLN 1.25 mg Intravitreal  . Bevacizumab (AVASTIN) SOLN 1.25 mg Intravitreal  . Bevacizumab (AVASTIN) SOLN 1.25 mg Intravitreal  . Bevacizumab (AVASTIN) SOLN 1.25 mg Intravitreal  . Bevacizumab (AVASTIN) SOLN 1.25 mg Intravitreal  . Bevacizumab (AVASTIN) SOLN 1.25 mg Intravitreal  . Bevacizumab (AVASTIN) SOLN 1.25 mg Intravitreal  . Bevacizumab (AVASTIN) SOLN 1.25 mg Intravitreal  . Bevacizumab (AVASTIN) SOLN 1.25 mg Intravitreal      REVIEW OF SYSTEMS: ROS    Positive for: Gastrointestinal, Eyes   Negative for: Constitutional, Neurological, Skin, Genitourinary, Musculoskeletal, HENT, Endocrine, Cardiovascular, Respiratory, Psychiatric, Allergic/Imm, Heme/Lymph   Last edited by Corrinne EagleEnglish, Ashley L on 03/14/2019  8:26 AM. (History)       ALLERGIES No Known Allergies  PAST MEDICAL HISTORY Past Medical History:  Diagnosis Date  . Cataract 2013   OU  . Macular degeneration    Wet OD, Dry OS   Past Surgical History:  Procedure Laterality Date  . CATARACT EXTRACTION     2013  . DILATION AND CURETTAGE OF UTERUS    . EYE SURGERY      FAMILY HISTORY Family History  Problem Relation Age of Onset  . Stroke Father     SOCIAL HISTORY Social History   Tobacco Use  . Smoking status: Never Smoker  . Smokeless tobacco:  Never Used  Substance Use Topics  . Alcohol use: No    Frequency: Never  . Drug use: No         OPHTHALMIC EXAM:  Base Eye Exam    Visual Acuity (Snellen - Linear)      Right Left   Dist cc 20/70 +2 20/30 +2   Dist ph cc NI NI   Correction:  Glasses       Tonometry (Tonopen, 8:28 AM)      Right Left   Pressure 15 15       Pupils      Dark Light Shape React APD   Right 3 2 Round Brisk 0   Left 3 2 Round Brisk 0       Extraocular Movement      Right Left    Full Full       Neuro/Psych     Oriented x3:  Yes   Mood/Affect:  Normal       Dilation    Both eyes:  1.0% Mydriacyl, 2.5% Phenylephrine @ 8:29 AM        Slit Lamp and Fundus Exam    Slit Lamp Exam      Right Left   Lids/Lashes Dermatochalasis - upper lid Dermatochalasis - upper lid   Conjunctiva/Sclera White and quiet White and quiet   Cornea Arcus, 1+ Punctate epithelial erosions  Arcus, Inferior 1+ Punctate epithelial erosions   Anterior Chamber Deep and quiet Deep and quiet   Iris Round and dilated Round and dilated   Lens PC IOL in good postion with anterior capsular phimosis, 1+ Posterior capsular opacification PC IOL in good postion with anterior capsular phimosis, 1+ Posterior capsular opacification sparing center   Vitreous syneresis Vitreous syneresis       Fundus Exam      Right Left   Disc Pink and Sharp Normal   C/D Ratio 0.4 0.6   Macula Blunted foveal reflex, mild ERM, +refractile drusen, Retinal pigment epithelial mottling and atrophy centrally, Stable improvement in PED, No heme. Interval increase in macular cyst from VMT Blunted foveal reflex, Flat, Drusen, early Atrophy, RPE mottling, clumping, and atrophy +PEDs, No heme   Vessels Vascular attenuation Vascular attenuation   Periphery Attached, scattered peripheral drusen, peripapillary drusen  Attached with peripheral drusen        Refraction    Wearing Rx      Sphere Cylinder Axis Add   Right -1.00 +0.75 180 +2.75   Left -0.50 +0.75 004 +2.75          IMAGING AND PROCEDURES  Imaging and Procedures for 02/09/18  OCT, Retina - OU - Both Eyes       Right Eye Quality was good. Central Foveal Thickness: 803. Progression has worsened. Findings include abnormal foveal contour, retinal drusen , intraretinal fluid, pigment epithelial detachment, vitreous traction, no SRF (Interval increase in IRF/VMT/macular cyst ).   Left Eye Quality was good. Central Foveal Thickness: 288. Progression has been stable. Findings include vitreous  traction, pigment epithelial detachment, abnormal foveal contour, no SRF, retinal drusen , epiretinal membrane, outer retinal atrophy, intraretinal fluid (Trace cystic changes below VMT).   Notes Images taken, stored on drive  Diagnosis / Impression:  OD: Exudative ARMD OD; VMT w/ interval increase in macular cyst / IRF OD OS: Nonexudative ARMD OS; VMT OS stable  Clinical management:  See below  Abbreviations: NFP - Normal foveal profile. CME - cystoid macular edema. PED - pigment epithelial detachment. IRF -  intraretinal fluid. SRF - subretinal fluid. EZ - ellipsoid zone. ERM - epiretinal membrane. ORA - outer retinal atrophy. ORT - outer retinal tubulation. SRHM - subretinal hyper-reflective material                  ASSESSMENT/PLAN:    ICD-10-CM   1. Exudative age-related macular degeneration of right eye with active choroidal neovascularization (HCC) H35.3211 CANCELED: Intravitreal Injection, Pharmacologic Agent - OD - Right Eye  2. 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 Pseudophakia of both eyes Z96.1   5. 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 Retinal edema H35.81 OCT, Retina - OU - Both Eyes  7. Vitreomacular adhesion of right eye H43.821     1. 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    - S/P IVA OD #1 (12.03.18), #2 (01.02.19), #3 (01.30.19), #4 (03.01.19), #5 (04.01.19), #6 (04.30.19), #7 (06.05.19), #8 (07.19.19), #9 (08.30.19), #10 (10.14.19), #11 (11.18.19), #12 (12.30.19), #13 (02.10.20), #14 (04.06.20)  - has had good response to IVA  - intraretinal heme improved on exam  - OCT today shows VMT with interval increase in macular cyst / IRF OD  - BCVA 20/70 from 20/60 -- mostly from VMT and increase in macular cysts  - FA (04.01.19) shows mild CNVM OD consistent with exudative ARMD  - recommend holding off on IVA  OD today due to traction and scheduling for PPV on June 25  - will plan for 25g PPV w/ membrane peel and gas for VMT/macular cyst repair OD under general anesthesia  - RBA of procedure discussed, questions answered -- tried to call pt's daughter during visit, but unable to connect  - informed consent obtained and signed  - offered to speak with daughter re: surgery  - f/u June 22nd for recheck prior to surgery -- pt to bring daughter -- DFE/OCT  2. Age related macular degeneration, non-exudative, OS  - The incidence, anatomy, and pathology of dry AMD, risk of progression, and the AREDS and AREDS 2 study including smoking risks discussed with patient.  - Continue amsler grid monitoring  - will continue to monitor for conversion to exudative ARMD  3. 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 Pseudophakia OU  - s/p CE/IOL OU  - s/p YAG OU  - beautiful surgeries, doing well  - monitor  5. Vitreous syneresis OU  - Pt with intermittent floaters OU  - No frank PVD on exam  - Discussed findings and prognosis  - No RT or RD on 360 peripheral exam  - Reviewed s/s of RT/RD  - Strict return precautions for any such RT/RD signs/symptoms   Ophthalmic Meds Ordered this visit:  No orders of the defined types were placed in this encounter.      Return in about 3 weeks (around 04/04/2019) for f/u VMT OD, DFE, OCT.  There are no Patient Instructions on file for this visit.   Explained the  diagnoses, plan, and follow up with the patient and they expressed understanding.  Patient expressed understanding of the importance  of proper follow up care.   This document serves as a record of services personally performed by Karie Chimera, MD, PhD. It was created on their behalf by Laurian Brim, OA, an ophthalmic assistant. The creation of this record is the provider's dictation and/or activities during the visit.    Electronically signed by: Laurian Brim, OA  06.01.2020 4:29 PM    Karie Chimera, M.D., Ph.D. Diseases & Surgery of the Retina and Vitreous [facility]  I have reviewed the above documentation for accuracy and completeness, and I agree with the above. Karie Chimera, M.D., Ph.D. 03/14/19 4:34 PM      Abbreviations: M myopia (nearsighted); A astigmatism; H hyperopia (farsighted); P presbyopia; Mrx spectacle prescription;  CTL contact lenses; OD right eye; OS left eye; OU both eyes  XT exotropia; ET esotropia; PEK punctate epithelial keratitis; PEE punctate epithelial erosions; DES dry eye syndrome; MGD meibomian gland dysfunction; ATs artificial tears; PFAT's preservative free artificial tears; NSC nuclear sclerotic cataract; PSC posterior subcapsular cataract; ERM epi-retinal membrane; PVD posterior vitreous detachment; RD retinal detachment; DM diabetes mellitus; DR diabetic retinopathy; NPDR non-proliferative diabetic retinopathy; PDR proliferative diabetic retinopathy; CSME clinically significant macular edema; DME diabetic macular edema; dbh dot blot hemorrhages; CWS cotton wool spot; POAG primary open angle glaucoma; C/D cup-to-disc ratio; HVF humphrey visual field; GVF goldmann visual field; OCT optical coherence tomography; IOP intraocular pressure; BRVO Branch retinal vein occlusion; CRVO central retinal vein occlusion; CRAO central retinal artery occlusion; BRAO branch retinal artery occlusion; RT retinal tear; SB scleral buckle; PPV pars plana vitrectomy; VH Vitreous hemorrhage; PRP panretinal laser photocoagulation; IVK intravitreal kenalog; VMT vitreomacular traction; MH  Macular hole;  NVD neovascularization of the disc; NVE neovascularization elsewhere; AREDS age related eye disease study; ARMD age related macular degeneration; POAG primary open angle glaucoma; EBMD epithelial/anterior basement membrane dystrophy; ACIOL anterior chamber intraocular lens; IOL intraocular lens; PCIOL posterior chamber intraocular lens; Phaco/IOL phacoemulsification with intraocular lens placement; PRK photorefractive keratectomy; LASIK laser assisted in situ keratomileusis; HTN hypertension; DM diabetes mellitus; COPD chronic obstructive pulmonary disease

## 2019-03-30 DIAGNOSIS — E7800 Pure hypercholesterolemia, unspecified: Secondary | ICD-10-CM | POA: Diagnosis not present

## 2019-03-30 DIAGNOSIS — M810 Age-related osteoporosis without current pathological fracture: Secondary | ICD-10-CM | POA: Diagnosis not present

## 2019-03-30 DIAGNOSIS — Z780 Asymptomatic menopausal state: Secondary | ICD-10-CM | POA: Diagnosis not present

## 2019-03-30 DIAGNOSIS — K219 Gastro-esophageal reflux disease without esophagitis: Secondary | ICD-10-CM | POA: Diagnosis not present

## 2019-03-30 DIAGNOSIS — Z0000 Encounter for general adult medical examination without abnormal findings: Secondary | ICD-10-CM | POA: Diagnosis not present

## 2019-03-30 DIAGNOSIS — K5909 Other constipation: Secondary | ICD-10-CM | POA: Diagnosis not present

## 2019-03-30 DIAGNOSIS — Z23 Encounter for immunization: Secondary | ICD-10-CM | POA: Diagnosis not present

## 2019-03-31 DIAGNOSIS — M810 Age-related osteoporosis without current pathological fracture: Secondary | ICD-10-CM | POA: Diagnosis not present

## 2019-04-02 NOTE — Progress Notes (Signed)
[facility] - Clinic Note  04/04/2019     CHIEF COMPLAINT Patient presents for Retina Evaluation   HISTORY OF PRESENT ILLNESS: Lindsay Harvey is a 83 y.o. female who presents to the clinic today for:   HPI    Retina Evaluation    In both eyes.  This started weeks ago.  Duration of weeks.  Context:  distance vision and near vision.  I, the attending physician,  performed the HPI with the patient and updated documentation appropriately.          Comments    Patient states she is unsure of how her vision is doing.  She denies any eye pain but states they are still very irritated.  Patient denies any new or worsening floaters or fol OU.  [facility](Organ interpreter present today)  Patient has been using frequent artificial tears for relief of irritation.       Last edited by Rennis ChrisZamora, Rayyan Burley, MD on 04/04/2019  9:54 AM. (History)    pt states she is unsure if the vision has changed or not   Referring physician: Pearson GrippeKim, James, MD [address] [place] [id number]  HISTORICAL INFORMATION:   Selected notes from the MEDICAL RECORD NUMBER Referred by Dr. Marcha Solders. [name] for concern of AMD OU, CNV;  LEE- 11.20.18 (C. [name]) [BCVA OD: 20/40 OS 20/30] Ocular Hx- DES OU, pseudophakia OU, keratoconjunctivitis sicca OU PMH- high chol.    CURRENT MEDICATIONS: Current Outpatient Medications (Ophthalmic Drugs)  Medication Sig  . carboxymethylcellulose (REFRESH PLUS) 0.5 % SOLN Place 1 drop into both eyes 3 (three) times daily as needed (dry/irritated eyes).   Marland Kitchen. REFRESH OPTIVE MEGA-3 0.5-1-0.5 % SOLN Place 1 drop into both eyes 3 (three) times daily as needed (discomfort/eye irritation.).   No current facility-administered medications for this visit.  (Ophthalmic Drugs)   Current Outpatient Medications (Other)  Medication Sig  . calcium carbonate (OSCAL) 1500 (600 Ca) MG TABS tablet Take 600 mg of elemental calcium by mouth 2 (two) times a day.  .  Cholecalciferol (VITAMIN D3) 50 MCG (2000 UT) TABS Take 2,000 Units by mouth daily.  . Multiple Vitamins-Minerals (EMERGEN-C IMMUNE PO) Take 1 packet by mouth daily as needed (for immune support).  . Multiple Vitamins-Minerals (PRESERVISION AREDS 2 PO) Take 1 tablet by mouth 2 (two) times a day.   . simvastatin (ZOCOR) 40 MG tablet Take 10 mg by mouth every evening. 1/4 tablet at night   Current Facility-Administered Medications (Other)  Medication Route  . Bevacizumab (AVASTIN) SOLN 1.25 mg Intravitreal  . Bevacizumab (AVASTIN) SOLN 1.25 mg Intravitreal  . Bevacizumab (AVASTIN) SOLN 1.25 mg Intravitreal  . Bevacizumab (AVASTIN) SOLN 1.25 mg Intravitreal  . Bevacizumab (AVASTIN) SOLN 1.25 mg Intravitreal  . Bevacizumab (AVASTIN) SOLN 1.25 mg Intravitreal  . Bevacizumab (AVASTIN) SOLN 1.25 mg Intravitreal  . Bevacizumab (AVASTIN) SOLN 1.25 mg Intravitreal  . Bevacizumab (AVASTIN) SOLN 1.25 mg Intravitreal  . Bevacizumab (AVASTIN) SOLN 1.25 mg Intravitreal  . Bevacizumab (AVASTIN) SOLN 1.25 mg Intravitreal  . Bevacizumab (AVASTIN) SOLN 1.25 mg Intravitreal  . Bevacizumab (AVASTIN) SOLN 1.25 mg Intravitreal      REVIEW OF SYSTEMS: ROS    Positive for: Genitourinary, Eyes   Negative for: Constitutional, Gastrointestinal, Neurological, Skin, Musculoskeletal, HENT, Endocrine, Cardiovascular, Respiratory, Psychiatric, Allergic/Imm, Heme/Lymph   Last edited by Corrinne EagleEnglish, Ashley L on 04/04/2019  8:24 AM. (History)       ALLERGIES No Known Allergies  PAST MEDICAL HISTORY Past Medical  History:  Diagnosis Date  . Cataract 2013   OU  . Macular degeneration    Wet OD, Dry OS   Past Surgical History:  Procedure Laterality Date  . CATARACT EXTRACTION     2013  . DILATION AND CURETTAGE OF UTERUS    . EYE SURGERY      FAMILY HISTORY Family History  Problem Relation Age of Onset  . Stroke Father     SOCIAL HISTORY Social History   Tobacco Use  . Smoking status: Never Smoker   . Smokeless tobacco: Never Used  Substance Use Topics  . Alcohol use: No    Frequency: Never  . Drug use: No         OPHTHALMIC EXAM:  Base Eye Exam    Visual Acuity (Snellen - Linear)      Right Left   Dist cc 20/80 +1 20/40 +1   Dist ph cc NI 20/25 -2   Correction: Glasses       Tonometry (Tonopen, 8:28 AM)      Right Left   Pressure 13 15       Pupils      Dark Light Shape React APD   Right 4 3 Round Brisk 0   Left 4 3 Round Brisk 0       Extraocular Movement      Right Left    Full Full       Neuro/Psych    Oriented x3: Yes   Mood/Affect: Normal        Slit Lamp and Fundus Exam    Slit Lamp Exam      Right Left   Lids/Lashes Dermatochalasis - upper lid Dermatochalasis - upper lid   Conjunctiva/Sclera White and quiet White and quiet   Cornea Arcus, 1+ Punctate epithelial erosions  Arcus, Inferior 1+ Punctate epithelial erosions   Anterior Chamber Deep and quiet Deep and quiet   Iris Round and dilated Round and dilated   Lens PC IOL in good postion with anterior capsular phimosis, 1+ Posterior capsular opacification PC IOL in good postion with anterior capsular phimosis, 1+ Posterior capsular opacification sparing center   Vitreous syneresis, vitreous condensations / silicone bubbles superiorly Vitreous syneresis       Fundus Exam      Right Left   Disc Pink and Sharp, peripapillary drusen Normal   C/D Ratio 0.4 0.6   Macula Blunted foveal reflex, mild ERM, +refractile drusen, Retinal pigment epithelial mottling and atrophy centrally, Stable improvement in PED, No heme, Interval increase in macular cyst from VMT Blunted foveal reflex, Flat, Drusen, early Atrophy, RPE mottling, clumping, and atrophy +PEDs, No heme   Vessels Vascular attenuation Vascular attenuation   Periphery Attached, scattered peripheral drusen, peripapillary drusen  Attached with peripheral drusen        Refraction    Wearing Rx      Sphere Cylinder Axis Add   Right -1.00  +0.75 180 +2.75   Left -0.50 +0.75 004 +2.75          IMAGING AND PROCEDURES  Imaging and Procedures for 02/09/18  OCT, Retina - OU - Both Eyes       Right Eye Quality was good. Central Foveal Thickness: 781. Progression has worsened. Findings include abnormal foveal contour, retinal drusen , intraretinal fluid, pigment epithelial detachment, vitreous traction, subretinal fluid (Interval increase in central edema; focal area of VMT superior to ST arcades - caught on widefield - stable from prior).   Left Eye Quality was  good. Central Foveal Thickness: 278. Progression has been stable. Findings include vitreous traction, pigment epithelial detachment, abnormal foveal contour, no SRF, retinal drusen , epiretinal membrane, outer retinal atrophy, intraretinal fluid (Trace cystic changes below VMT).   Notes Images taken, stored on drive  Diagnosis / Impression:  OD: Exudative ARMD OD; VMT w/ interval increase in macular cyst / IRF / SRF OD OS: Nonexudative ARMD OS; VMT OS stable  Clinical management:  See below  Abbreviations: NFP - Normal foveal profile. CME - cystoid macular edema. PED - pigment epithelial detachment. IRF - intraretinal fluid. SRF - subretinal fluid. EZ - ellipsoid zone. ERM - epiretinal membrane. ORA - outer retinal atrophy. ORT - outer retinal tubulation. SRHM - subretinal hyper-reflective material         Intravitreal Injection, Pharmacologic Agent - OD - Right Eye       Time Out 04/04/2019. 9:42 AM. Confirmed correct patient, procedure, site, and patient consented.   Anesthesia Topical anesthesia was used. Anesthetic medications included Lidocaine 2%, Proparacaine 0.5%.   Procedure Preparation included 5% betadine to ocular surface, eyelid speculum. A supplied needle was used.   Injection:  1.25 mg Bevacizumab (AVASTIN) SOLN   NDC: 16109-604-5450242-060-01, Lot: [id number] , Expiration date: 05/03/2021   Route: Intravitreal, Site: Right Eye, Waste: 0  mL  Post-op Post injection exam found visual acuity of at least counting fingers. The patient tolerated the procedure well. There were no complications. The patient received written and verbal post procedure care education.                 ASSESSMENT/PLAN:    ICD-10-CM   1. 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 Pseudophakia of both eyes  Z96.1   5. Vitreous syneresis of both eyes  H43.393   6. Retinal edema  H35.81 OCT, Retina - OU - Both Eyes  7. Vitreomacular adhesion of right eye  H43.821     1. 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    - S/P IVA OD #1 (12.03.18), #2 (01.02.19), #3 (01.30.19), #4 (03.01.19), #5 (04.01.19), #6 (04.30.19), #7 (06.05.19), #8 (07.19.19), #9 (08.30.19), #10 (10.14.19), #11 (11.18.19), #12 (12.30.19), #13 (02.10.20), #14 (04.06.20)  - has had good response to IVA  - intraretinal heme improved on exam  - OCT today shows VMT with interval increase in macular cyst / IRF OD  - BCVA 20/80 from 20/70 -- mostly from VMT and increase in macular cysts  - FA (04.01.19) shows mild CNVM OD consistent with exudative ARMD  - recommend IVA #15 today, 06.22.20 -- maintenance  - pt wishes to proceed  - RBA of procedure discussed, questions answered  - informed consent obtained and signed  - see procedure note  - [facility]  2. Age related macular degeneration, non-exudative, OS  - The incidence, anatomy, and pathology of dry AMD, risk of progression, and the AREDS and AREDS 2 study including smoking risks discussed with patient.  - Continue amsler  grid monitoring  - will continue to monitor for conversion to exudative ARMD  3,4. 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 Pseudophakia OU  - s/p CE/IOL OU  - s/p YAG OU  - beautiful surgeries, doing well  - monitor  6. Vitreous syneresis OU  - Pt with intermittent floaters OU  - No frank PVD on exam  - Discussed findings and prognosis  - No RT or RD on 360 peripheral exam  - Reviewed s/s of RT/RD  - Strict return precautions for any such RT/RD signs/symptoms   Ophthalmic Meds Ordered this visit:  Meds ordered this encounter  Medications  . Bevacizumab (AVASTIN) SOLN 1.25 mg       Return in about 4 days (around 04/08/2019) for POV.  There are no Patient Instructions on file for this visit.   Explained the diagnoses, plan, and follow up with the patient and they expressed understanding.  Patient expressed understanding of the importance of proper follow up care.   This document serves as a record of services personally performed by Gardiner Sleeper, MD, PhD. It was created on their behalf by Ernest Mallick, OA, an ophthalmic assistant. The creation of this record is the provider's dictation and/or activities during the visit.    Electronically signed by: Ernest Mallick, OA 06.20.2020 1:13 PM     Gardiner Sleeper, M.D., Ph.D. Diseases & Surgery of the Retina and Vitreous [facility]  I have reviewed the above documentation for accuracy and completeness, and I agree with the above. Gardiner Sleeper, M.D., Ph.D. 04/04/19 1:13 PM    Abbreviations: M myopia (nearsighted); A  astigmatism; H hyperopia (farsighted); P presbyopia; Mrx spectacle prescription;  CTL contact lenses; OD right eye; OS left eye; OU both eyes  XT exotropia; ET esotropia; PEK punctate epithelial keratitis; PEE punctate epithelial erosions; DES dry eye syndrome; MGD meibomian gland dysfunction; ATs artificial tears; PFAT's preservative free artificial tears; [place] nuclear sclerotic cataract; PSC posterior subcapsular cataract; ERM epi-retinal membrane; PVD posterior vitreous detachment; RD retinal detachment; DM diabetes mellitus; DR diabetic retinopathy; NPDR non-proliferative diabetic retinopathy; PDR proliferative diabetic retinopathy; CSME clinically significant macular edema; DME diabetic macular edema; dbh dot blot hemorrhages; CWS cotton wool spot; POAG primary open angle glaucoma; C/D cup-to-disc ratio; HVF humphrey visual field; GVF goldmann visual field; OCT optical coherence tomography; IOP intraocular pressure; BRVO Branch retinal vein occlusion; CRVO central retinal vein occlusion; CRAO central retinal artery occlusion; BRAO branch retinal artery occlusion; RT retinal tear; SB scleral buckle; PPV pars plana vitrectomy; VH Vitreous hemorrhage; PRP panretinal laser photocoagulation; IVK intravitreal kenalog; VMT vitreomacular traction; MH Macular hole;  NVD neovascularization of the disc; NVE neovascularization elsewhere; AREDS age related eye disease study; ARMD age related macular degeneration; POAG primary open angle glaucoma; EBMD epithelial/anterior basement membrane dystrophy; ACIOL anterior chamber intraocular lens; IOL intraocular lens; PCIOL posterior chamber intraocular lens; Phaco/IOL phacoemulsification with intraocular lens placement; [place] photorefractive keratectomy; LASIK laser assisted in situ keratomileusis; HTN hypertension; DM diabetes mellitus; COPD chronic obstructive pulmonary disease

## 2019-04-04 ENCOUNTER — Other Ambulatory Visit (HOSPITAL_COMMUNITY)
Admission: RE | Admit: 2019-04-04 | Discharge: 2019-04-04 | Disposition: A | Discharge status: Home / Self Care | Payer: Medicare HMO | Source: Ambulatory Visit | Attending: Ophthalmology | Admitting: Ophthalmology

## 2019-04-04 ENCOUNTER — Ambulatory Visit (INDEPENDENT_AMBULATORY_CARE_PROVIDER_SITE_OTHER): Payer: Medicare HMO | Admitting: Ophthalmology

## 2019-04-04 ENCOUNTER — Other Ambulatory Visit: Payer: Self-pay

## 2019-04-04 ENCOUNTER — Encounter (INDEPENDENT_AMBULATORY_CARE_PROVIDER_SITE_OTHER): Payer: Self-pay | Admitting: Ophthalmology

## 2019-04-04 DIAGNOSIS — H43823 Vitreomacular adhesion, bilateral: Secondary | ICD-10-CM | POA: Diagnosis not present

## 2019-04-04 DIAGNOSIS — Z961 Presence of intraocular lens: Secondary | ICD-10-CM | POA: Diagnosis not present

## 2019-04-04 DIAGNOSIS — H353121 Nonexudative age-related macular degeneration, left eye, early dry stage: Secondary | ICD-10-CM | POA: Diagnosis not present

## 2019-04-04 DIAGNOSIS — Z1159 Encounter for screening for other viral diseases: Secondary | ICD-10-CM | POA: Insufficient documentation

## 2019-04-04 DIAGNOSIS — H3581 Retinal edema: Secondary | ICD-10-CM

## 2019-04-04 DIAGNOSIS — H353211 Exudative age-related macular degeneration, right eye, with active choroidal neovascularization: Secondary | ICD-10-CM

## 2019-04-04 DIAGNOSIS — H43393 Other vitreous opacities, bilateral: Secondary | ICD-10-CM

## 2019-04-04 LAB — SARS CORONAVIRUS 2 (TAT 6-24 HRS): SARS Coronavirus 2: NEGATIVE

## 2019-04-04 MED ORDER — BEVACIZUMAB CHEMO INJECTION 1.25MG/0.05ML SYRINGE FOR KALEIDOSCOPE
1.2500 mg | INTRAVITREAL | Status: AC | PRN
  Administered 2019-04-04: 1.25 mg via INTRAVITREAL

## 2019-04-05 NOTE — H&P (Signed)
Lindsay Harvey is an 83 y.o. female.    Chief Complaint: decreased vision OS  HPI: 83 yo F with history of ARMD OU s/p anti-VEGF therapy OD, has had progressive decline in vision OD over several months. On examination, she has been noted to have progressive vitreomacular traction with macular cysts OD that are causing her symptoms and impairing her activities of daily living. She has elected to proceed with 25g PPV w/ membrane peel to relieve her VMT and repair her macular cysts OD.  Past Medical History:  Diagnosis Date  . Cataract 2013   OU  . Macular degeneration    Wet OD, Dry OS    Past Surgical History:  Procedure Laterality Date  . CATARACT EXTRACTION     2013  . DILATION AND CURETTAGE OF UTERUS    . EYE SURGERY      Family History  Problem Relation Age of Onset  . Stroke Father    Social History:  reports that she has never smoked. She has never used smokeless tobacco. She reports that she does not drink alcohol or use drugs.  Allergies: No Known Allergies  No medications prior to admission.    Review of systems otherwise negative  There were no vitals taken for this visit.  Physical exam: Mental status: oriented x3. Eyes: See eye exam associated with this date of surgery Ears, Nose, Throat: within normal limits Neck: Within Normal limits General: within normal limits Chest: Within normal limits Breast: deferred Heart: Within normal limits Abdomen: Within normal limits GU: deferred Extremities: within normal limits Skin: within normal limits  Assessment/Plan 1. Progressive vitreomacular traction with macular cyst, OD  Plan: To [facility] for 25g PPV w/ ICG and membrane peel and gas, right eye under general anesthesia - case scheduled on 6.25.2020, 1130 am -- [facility] OR 08  Gardiner Sleeper, M.D., Ph.D. Vitreoretinal Surgeon [facility]

## 2019-04-06 ENCOUNTER — Other Ambulatory Visit: Payer: Self-pay

## 2019-04-06 ENCOUNTER — Encounter (HOSPITAL_COMMUNITY): Payer: Self-pay | Admitting: *Deleted

## 2019-04-06 NOTE — Progress Notes (Signed)
SDW-pre-op call completed by pt daughter Lindsay Harvey with pt verbal consent. Daughter denies that pt C/O SOB and chest pain. Daughter denies that pt is under the care of a cardiologist. Daughter denies that pt had a stress test, echo and cardiac cath. Daughter denies that pt had an EKG and chest x ray within the last year. Daughter denies recent labs. Daughter made aware to have pt stop taking Aspirin (unless otherwise advised by surgeon), vitamins, fish oil and herbal medications. Do not take any NSAIDs ie: Ibuprofen, Advil, Naproxen (Aleve), Motrin, BC and Goody Powder.  Daughter denies that pt and family member tested positive for COVI-19.     Daughter denies that pt and family members experienced the following symptoms:  Cough yes/no: No Fever (>100.22F)  yes/no: No Runny nose yes/no: No Sore throat yes/no: No Difficulty breathing/shortness of breath  yes/no: No  Have you or a family member traveled in the last 14 days and where? yes/no: No  Daughter reminded that hospital visitation restrictions are in effect and the importance of the restrictions.  Daughter verbalized understanding of all pre-op instructions.

## 2019-04-06 NOTE — Progress Notes (Signed)
[facility] - Clinic Note  04/08/2019     CHIEF COMPLAINT Patient presents for Post-op Follow-up   HISTORY OF PRESENT ILLNESS: Lindsay Harvey is a 83 y.o. female who presents to the clinic today for:   HPI    Post-op Follow-up    In right eye.  Discomfort includes foreign body sensation.  Vision is blurred at distance.  I, the attending physician,  performed the HPI with the patient and updated documentation appropriately.          Comments    Patient states vision is very blurry.  She denies eye pain but states it is a little uncomfortable. Patient denies any new or worsening floaters or fol.  (Daughter present for interpretation today)       Last edited by Rennis ChrisZamora, Vincient Vanaman, MD on 04/10/2019  9:44 PM. (History)    pt states she had a little bit of a hard time last night after sx, she states her eye is in a little bit of pain this morning, she states she took some tylenol and it seemed to help  Referring physician: Pearson GrippeKim, James, MD [id number] [place] [id number]  HISTORICAL INFORMATION:   Selected notes from the MEDICAL RECORD NUMBER Referred by Dr. Marcha Solders. [name] for concern of AMD OU, CNV;  LEE- 11.20.18 (C. [name]) [BCVA OD: 20/40 OS 20/30] Ocular Hx- DES OU, pseudophakia OU, keratoconjunctivitis sicca OU PMH- high chol.    CURRENT MEDICATIONS: Current Outpatient Medications (Ophthalmic Drugs)  Medication Sig  . carboxymethylcellulose (REFRESH PLUS) 0.5 % SOLN Place 1 drop into both eyes 3 (three) times daily as needed (dry/irritated eyes).   Marland Kitchen. REFRESH OPTIVE MEGA-3 0.5-1-0.5 % SOLN Place 1 drop into both eyes 3 (three) times daily as needed (discomfort/eye irritation.).   No current facility-administered medications for this visit.  (Ophthalmic Drugs)   Current Outpatient Medications (Other)  Medication Sig  . calcium carbonate (OSCAL) 1500 (600 Ca) MG TABS tablet Take 600 mg of elemental calcium by mouth 2 (two) times a day.  .  Cholecalciferol (VITAMIN D3) 50 MCG (2000 UT) TABS Take 2,000 Units by mouth daily.  . Multiple Vitamins-Minerals (EMERGEN-C IMMUNE PO) Take 1 packet by mouth daily as needed (for immune support).  . Multiple Vitamins-Minerals (PRESERVISION AREDS 2 PO) Take 1 tablet by mouth 2 (two) times a day.   . simvastatin (ZOCOR) 40 MG tablet Take 10 mg by mouth every evening. 1/4 tablet at night   Current Facility-Administered Medications (Other)  Medication Route  . Bevacizumab (AVASTIN) SOLN 1.25 mg Intravitreal  . Bevacizumab (AVASTIN) SOLN 1.25 mg Intravitreal  . Bevacizumab (AVASTIN) SOLN 1.25 mg Intravitreal  . Bevacizumab (AVASTIN) SOLN 1.25 mg Intravitreal  . Bevacizumab (AVASTIN) SOLN 1.25 mg Intravitreal  . Bevacizumab (AVASTIN) SOLN 1.25 mg Intravitreal  . Bevacizumab (AVASTIN) SOLN 1.25 mg Intravitreal  . Bevacizumab (AVASTIN) SOLN 1.25 mg Intravitreal  . Bevacizumab (AVASTIN) SOLN 1.25 mg Intravitreal  . Bevacizumab (AVASTIN) SOLN 1.25 mg Intravitreal  . Bevacizumab (AVASTIN) SOLN 1.25 mg Intravitreal  . Bevacizumab (AVASTIN) SOLN 1.25 mg Intravitreal  . Bevacizumab (AVASTIN) SOLN 1.25 mg Intravitreal      REVIEW OF SYSTEMS: ROS    Positive for: Genitourinary, Eyes   Negative for: Constitutional, Gastrointestinal, Neurological, Skin, Musculoskeletal, HENT, Endocrine, Cardiovascular, Respiratory, Psychiatric, Allergic/Imm, Heme/Lymph   Last edited by Corrinne EagleEnglish, Ashley L on 04/08/2019  8:25 AM. (History)       ALLERGIES No Known Allergies  PAST MEDICAL HISTORY Past  Medical History:  Diagnosis Date  . Allergies   . Cataract 2013   OU  . 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 Headache   . Hypercholesterolemia   . Macular degeneration    Wet OD, Dry OS  . Wears glasses    Past Surgical History:  Procedure Laterality Date  . 25 GAUGE PARS PLANA VITRECTOMY WITH 20 GAUGE MVR PORT FOR MACULAR HOLE Right 04/07/2019   Procedure: 25 GAUGE PARS  PLANA VITRECTOMY WITH 20 GAUGE MVR PORT FOR MACULAR HOLE;  Surgeon: Rennis ChrisZamora, Frayda Egley, MD;  [facility];  Service: Ophthalmology;  Laterality: Right;  . CATARACT EXTRACTION     2013  . DILATION AND CURETTAGE OF UTERUS    . EYE SURGERY    . GAS/FLUID EXCHANGE Right 04/07/2019   Procedure: Gas/Fluid Exchange;  Surgeon: Rennis ChrisZamora, Anani Gu, MD;  [facility];  Service: Ophthalmology;  Laterality: Right;  . MEMBRANE PEEL Right 04/07/2019   Procedure: [place]ula FlaxMembrane Peel;  Surgeon: Rennis ChrisZamora, Lequan Dobratz, MD;  [facility];  Service: Ophthalmology;  Laterality: Right;  . PHOTOCOAGULATION WITH LASER Right 04/07/2019   Procedure: Photocoagulation With Laser;  Surgeon: Rennis ChrisZamora, Maddix Kliewer, MD;  [facility];  Service: Ophthalmology;  Laterality: Right;    FAMILY HISTORY Family History  Problem Relation Age of Onset  . Stroke Father     SOCIAL HISTORY Social History   Tobacco Use  . Smoking status: Never Smoker  . Smokeless tobacco: Never Used  Substance Use Topics  . Alcohol use: No    Frequency: Never  . Drug use: No         OPHTHALMIC EXAM:  Base Eye Exam    Visual Acuity (Snellen - Linear)      Right Left   Dist [place] CF @ 1' 20/40 -1   Dist ph [place] NI 20/30 -2       Tonometry (Tonopen, 8:27 AM)      Right Left   Pressure 16 17       Pupils      Dark Light Shape React APD   Right 5 5 Round Dilated 0   Left 2 1 Round Minimal 0       Extraocular Movement      Right Left    Full Full       Neuro/Psych    Oriented x3: Yes   Mood/Affect: Normal       Dilation    Both eyes: 1.0% Mydriacyl, 2.5% Phenylephrine @ 8:27 AM        Slit Lamp and Fundus Exam    Slit Lamp Exam      Right Left   Lids/Lashes Dermatochalasis - upper lid, periorbital edema Dermatochalasis - upper lid   Conjunctiva/Sclera Subconjunctival hemorrhage, sutures intact White and quiet   Cornea Arcus, 1+ Punctate epithelial erosions, 1+ Descemet's folds, trace endopigment Arcus, Inferior 1+ Punctate epithelial  erosions   Anterior Chamber Deep and quiet Deep and quiet   Iris Round and dilated Round and dilated   Lens PC IOL in good postion with anterior capsular phimosis, 1+ Posterior capsular opacification PC IOL in good postion with anterior capsular phimosis, 1+ Posterior capsular opacification sparing center   Vitreous post vitrectomy, good gas fill Vitreous syneresis       Fundus Exam      Right Left   Disc Pink and Sharp, peripapillary drusen Normal   C/D Ratio 0.4 0.6   Macula flat under gas, ERM/VMT gone  Vessels Vascular attenuation    Periphery Attached, scattered peripheral drusen, peripapillary drusen, laser changes superiorly         Refraction    Wearing Rx   Did not bring glasses today          IMAGING AND PROCEDURES  Imaging and Procedures for 02/09/18           ASSESSMENT/PLAN:    ICD-10-CM   1. Exudative age-related macular degeneration of right eye with active choroidal neovascularization ([place])  H35.3211   2. 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 Retinal edema  H35.81   5. Pseudophakia of both eyes  Z96.1   6. 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 Vitreomacular adhesion of right eye  H43.821     1. 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    - S/P IVA OD #1 (12.03.18), #2 (01.02.19), #3 (01.30.19), #4 (03.01.19), #5 (04.01.19), #6 (04.30.19), #7 (06.05.19), #8 (07.19.19), #9 (08.30.19), #10 (10.14.19), #11 (11.18.19), #12 (12.30.19), #13 (02.10.20), #14 (04.06.20)  - has had good response to IVA  - intraretinal heme improved on exam  - OCT today shows VMT with interval increase in macular cyst / IRF OD  - BCVA 20/80 from 20/70 -- mostly from VMT and increase in macular cysts  - FA (04.01.19) shows mild CNVM OD consistent with exudative ARMD  2. Age related macular degeneration, non-exudative,  OS  - The incidence, anatomy, and pathology of dry AMD, risk of progression, and the AREDS and AREDS 2 study including smoking risks discussed with patient.  - Continue amsler grid monitoring  - will continue to monitor for conversion to exudative ARMD  3,4. 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)   - Pre-op: VMT continues to worsen OD -- macular cyst increasing in volume and height  - Pre-op BCVA OD 20/80 from 20/70 from 20/60 -- progressive decline  - OS stable  - now POD1 s/p PPV/ICG/MP/14% C3F8 OD, 06.25.2020             - doing well this morning             - retina attached w/ VMT relieved and good gas bubble in place             - IOP okay at 16             - start PF 6x/day OD                         zymaxid QID OD                          Atropine BID OD    Brimonidine BID OD                         PSO ung QID OD              - cont face down positioning x3 days then can decrease positioning to 50% of time  - avoid laying flat on back              - eye shield when sleeping              - post op drop and positioning instructions reviewed              -  tylenol/ibuprofen for pain  - f/u 1 week  5. Pseudophakia OU  - s/p CE/IOL OU  - s/p YAG OU  - beautiful surgeries, doing well  - monitor  6. Vitreous syneresis OU  - Pt with intermittent floaters OU  - No frank PVD on exam  - Discussed findings and prognosis  - No RT or RD on 360 peripheral exam  - Reviewed s/s of RT/RD  - Strict return precautions for any such RT/RD signs/symptoms   Ophthalmic Meds Ordered this visit:  No orders of the defined types were placed in this encounter.      Return in about 1 week (around 04/15/2019) for POV OD.  There are no Patient Instructions on file for this visit.   Explained the diagnoses, plan, and follow up with the patient and they expressed understanding.  Patient expressed understanding of the importance of proper follow up care.   This document serves as a record of  services personally performed by Karie ChimeraBrian G. Amando Ishikawa, MD, PhD. It was created on their behalf by Laurian BrimAmanda Brown, OA, an ophthalmic assistant. The creation of this record is the provider's dictation and/or activities during the visit.    Electronically signed by: Laurian BrimAmanda Brown, OA  06.24.2020 9:48 PM    Karie ChimeraBrian G. Mabry Santarelli, M.D., Ph.D. Diseases & Surgery of the Retina and Vitreous [facility]  I have reviewed the above documentation for accuracy and completeness, and I agree with the above. Karie ChimeraBrian G. Klyn Kroening, M.D., Ph.D. 04/10/19 9:48 PM    Abbreviations: M myopia (nearsighted); A astigmatism; H hyperopia (farsighted); P presbyopia; Mrx spectacle prescription;  CTL contact lenses; OD right eye; OS left eye; OU both eyes  XT exotropia; ET esotropia; PEK punctate epithelial keratitis; PEE punctate epithelial erosions; DES dry eye syndrome; MGD meibomian gland dysfunction; ATs artificial tears; PFAT's preservative free artificial tears; NSC nuclear sclerotic cataract; PSC posterior subcapsular cataract; ERM epi-retinal membrane; PVD posterior vitreous detachment; RD retinal detachment; DM diabetes mellitus; DR diabetic retinopathy; NPDR non-proliferative diabetic retinopathy; PDR proliferative diabetic retinopathy; CSME clinically significant macular edema; DME diabetic macular edema; dbh dot blot hemorrhages; CWS cotton wool spot; POAG primary open angle glaucoma; C/D cup-to-disc ratio; HVF humphrey visual field; GVF goldmann visual field; OCT optical coherence tomography; IOP intraocular pressure; BRVO Branch retinal vein occlusion; CRVO central retinal vein occlusion; CRAO central retinal artery occlusion; BRAO branch retinal artery occlusion; RT retinal tear; SB scleral buckle; PPV pars plana vitrectomy; VH Vitreous hemorrhage; PRP panretinal laser photocoagulation; IVK intravitreal kenalog; VMT vitreomacular traction; MH Macular hole;  NVD neovascularization of the disc; NVE  neovascularization elsewhere; AREDS age related eye disease study; ARMD age related macular degeneration; POAG primary open angle glaucoma; EBMD epithelial/anterior basement membrane dystrophy; ACIOL anterior chamber intraocular lens; IOL intraocular lens; PCIOL posterior chamber intraocular lens; Phaco/IOL phacoemulsification with intraocular lens placement; PRK photorefractive keratectomy; LASIK laser assisted in situ keratomileusis; HTN hypertension; DM diabetes mellitus; COPD chronic obstructive pulmonary disease

## 2019-04-07 ENCOUNTER — Other Ambulatory Visit: Payer: Self-pay

## 2019-04-07 ENCOUNTER — Ambulatory Visit (HOSPITAL_COMMUNITY): Payer: Medicare HMO | Admitting: Certified Registered Nurse Anesthetist

## 2019-04-07 ENCOUNTER — Encounter (HOSPITAL_COMMUNITY)
Admission: RE | Disposition: A | Discharge status: Home / Self Care | Payer: Self-pay | Source: Home / Self Care | Attending: Ophthalmology

## 2019-04-07 ENCOUNTER — Ambulatory Visit (HOSPITAL_COMMUNITY)
Admission: RE | Admit: 2019-04-07 | Discharge: 2019-04-07 | Disposition: A | Discharge status: Home / Self Care | Payer: Medicare HMO | Attending: Ophthalmology | Admitting: Ophthalmology

## 2019-04-07 ENCOUNTER — Encounter (HOSPITAL_COMMUNITY): Payer: Self-pay

## 2019-04-07 DIAGNOSIS — H43821 Vitreomacular adhesion, right eye: Secondary | ICD-10-CM | POA: Diagnosis not present

## 2019-04-07 DIAGNOSIS — H35341 Macular cyst, hole, or pseudohole, right eye: Secondary | ICD-10-CM | POA: Insufficient documentation

## 2019-04-07 DIAGNOSIS — E7800 Pure hypercholesterolemia, unspecified: Secondary | ICD-10-CM | POA: Diagnosis not present

## 2019-04-07 DIAGNOSIS — Z9849 Cataract extraction status, unspecified eye: Secondary | ICD-10-CM | POA: Diagnosis not present

## 2019-04-07 DIAGNOSIS — H269 Unspecified cataract: Secondary | ICD-10-CM | POA: Diagnosis not present

## 2019-04-07 HISTORY — PX: PHOTOCOAGULATION WITH LASER: SHX6027

## 2019-04-07 HISTORY — DX: Presence of spectacles and contact lenses: Z97.3

## 2019-04-07 HISTORY — DX: Pure hypercholesterolemia, unspecified: E78.00

## 2019-04-07 HISTORY — DX: Other specified disorders of eye and adnexa: H57.89

## 2019-04-07 HISTORY — DX: Allergy, unspecified, initial encounter: T78.40XA

## 2019-04-07 HISTORY — PX: MEMBRANE PEEL: SHX5967

## 2019-04-07 HISTORY — DX: Headache, unspecified: R51.9

## 2019-04-07 HISTORY — PX: GAS/FLUID EXCHANGE: SHX5334

## 2019-04-07 HISTORY — PX: 25 GAUGE PARS PLANA VITRECTOMY WITH 20 GAUGE MVR PORT FOR MACULAR HOLE: SHX6096

## 2019-04-07 LAB — CBC
HCT: 34 % — ABNORMAL LOW (ref 36.0–46.0)
Hemoglobin: 11 g/dL — ABNORMAL LOW (ref 12.0–15.0)
MCH: 31.3 pg (ref 26.0–34.0)
MCHC: 32.4 g/dL (ref 30.0–36.0)
MCV: 96.6 fL (ref 80.0–100.0)
Platelets: 219 10*3/uL (ref 150–400)
RBC: 3.52 MIL/uL — ABNORMAL LOW (ref 3.87–5.11)
RDW: 12.1 % (ref 11.5–15.5)
WBC: 4.2 10*3/uL (ref 4.0–10.5)
nRBC: 0 % (ref 0.0–0.2)

## 2019-04-07 LAB — BASIC METABOLIC PANEL
Anion gap: 5 (ref 5–15)
BUN: 12 mg/dL (ref 8–23)
CO2: 27 mmol/L (ref 22–32)
Calcium: 9.1 mg/dL (ref 8.9–10.3)
Chloride: 108 mmol/L (ref 98–111)
Creatinine, Ser: 0.57 mg/dL (ref 0.44–1.00)
GFR calc Af Amer: >60 mL/min (ref >60)
GFR calc non Af Amer: >60 mL/min (ref >60)
Glucose, Bld: 92 mg/dL (ref 70–99)
Potassium: 4.1 mmol/L (ref 3.5–5.1)
Sodium: 140 mmol/L (ref 135–145)

## 2019-04-07 SURGERY — 25 GAUGE PARS PLANA VITRECTOMY WITH 20 GAUGE MVR PORT FOR MACULAR HOLE
Anesthesia: General | Site: Eye | Laterality: Right

## 2019-04-07 MED ORDER — FENTANYL CITRATE (PF) 250 MCG/5ML IJ SOLN
INTRAMUSCULAR | Status: AC
  Filled 2019-04-07: qty 5

## 2019-04-07 MED ORDER — EPINEPHRINE PF 1 MG/ML IJ SOLN
INTRAMUSCULAR | Status: AC
  Filled 2019-04-07: qty 1

## 2019-04-07 MED ORDER — BRIMONIDINE TARTRATE 0.2 % OP SOLN
OPHTHALMIC | Status: DC | PRN
  Administered 2019-04-07: 1 [drp] via OPHTHALMIC

## 2019-04-07 MED ORDER — CEFTAZIDIME 1 G IJ SOLR
INTRAMUSCULAR | Status: AC
  Filled 2019-04-07: qty 1

## 2019-04-07 MED ORDER — EPINEPHRINE 1 MG/10ML IJ SOSY
PREFILLED_SYRINGE | INTRAMUSCULAR | Status: AC
  Filled 2019-04-07: qty 10

## 2019-04-07 MED ORDER — LIDOCAINE 2% (20 MG/ML) 5 ML SYRINGE
INTRAMUSCULAR | Status: DC | PRN
  Administered 2019-04-07: 60 mg via INTRAVENOUS

## 2019-04-07 MED ORDER — ONDANSETRON HCL 4 MG/2ML IJ SOLN
INTRAMUSCULAR | Status: AC
  Filled 2019-04-07: qty 2

## 2019-04-07 MED ORDER — FENTANYL CITRATE (PF) 100 MCG/2ML IJ SOLN
25.0000 ug | INTRAMUSCULAR | Status: DC | PRN
  Administered 2019-04-07: 25 ug via INTRAVENOUS

## 2019-04-07 MED ORDER — ATROPINE SULFATE 1 % OP SOLN
1.0000 [drp] | OPHTHALMIC | Status: AC | PRN
  Administered 2019-04-07 (×3): 1 [drp] via OPHTHALMIC
  Filled 2019-04-07: qty 2

## 2019-04-07 MED ORDER — LACTATED RINGERS IV SOLN
INTRAVENOUS | Status: DC
  Administered 2019-04-07: 10:00:00 via INTRAVENOUS

## 2019-04-07 MED ORDER — PROPARACAINE HCL 0.5 % OP SOLN
1.0000 [drp] | OPHTHALMIC | Status: AC | PRN
  Administered 2019-04-07 (×3): 1 [drp] via OPHTHALMIC
  Filled 2019-04-07: qty 15

## 2019-04-07 MED ORDER — PROPOFOL 10 MG/ML IV BOLUS
INTRAVENOUS | Status: AC
  Filled 2019-04-07: qty 20

## 2019-04-07 MED ORDER — PREDNISOLONE ACETATE 1 % OP SUSP
OPHTHALMIC | Status: AC
  Filled 2019-04-07: qty 5

## 2019-04-07 MED ORDER — BSS PLUS IO SOLN
INTRAOCULAR | Status: AC
  Filled 2019-04-07: qty 500

## 2019-04-07 MED ORDER — PREDNISOLONE ACETATE 1 % OP SUSP
OPHTHALMIC | Status: DC | PRN
  Administered 2019-04-07: 1 [drp] via OPHTHALMIC

## 2019-04-07 MED ORDER — BSS IO SOLN
INTRAOCULAR | Status: AC
  Filled 2019-04-07: qty 15

## 2019-04-07 MED ORDER — BALANCED SALT IO SOLN
INTRAOCULAR | Status: DC | PRN
  Administered 2019-04-07: 15 mL via INTRAOCULAR

## 2019-04-07 MED ORDER — BRIMONIDINE TARTRATE 0.2 % OP SOLN
OPHTHALMIC | Status: AC
  Filled 2019-04-07: qty 5

## 2019-04-07 MED ORDER — TRIAMCINOLONE ACETONIDE 40 MG/ML IJ SUSP
INTRAMUSCULAR | Status: AC
  Filled 2019-04-07: qty 5

## 2019-04-07 MED ORDER — BACITRACIN-POLYMYXIN B 500-10000 UNIT/GM OP OINT
TOPICAL_OINTMENT | OPHTHALMIC | Status: AC
  Filled 2019-04-07: qty 3.5

## 2019-04-07 MED ORDER — GATIFLOXACIN 0.5 % OP SOLN
OPHTHALMIC | Status: AC
  Filled 2019-04-07: qty 2.5

## 2019-04-07 MED ORDER — PROPOFOL 10 MG/ML IV BOLUS
INTRAVENOUS | Status: DC | PRN
  Administered 2019-04-07 (×4): 50 mg via INTRAVENOUS

## 2019-04-07 MED ORDER — CARBACHOL 0.01 % IO SOLN
INTRAOCULAR | Status: AC
  Filled 2019-04-07: qty 1.5

## 2019-04-07 MED ORDER — DORZOLAMIDE HCL-TIMOLOL MAL 2-0.5 % OP SOLN
OPHTHALMIC | Status: AC
  Filled 2019-04-07: qty 10

## 2019-04-07 MED ORDER — BACITRACIN-POLYMYXIN B 500-10000 UNIT/GM OP OINT
TOPICAL_OINTMENT | OPHTHALMIC | Status: DC | PRN
  Administered 2019-04-07: 1 via OPHTHALMIC

## 2019-04-07 MED ORDER — DEXAMETHASONE SODIUM PHOSPHATE 10 MG/ML IJ SOLN
INTRAMUSCULAR | Status: AC
  Filled 2019-04-07: qty 1

## 2019-04-07 MED ORDER — LIDOCAINE HCL 2 % IJ SOLN
INTRAMUSCULAR | Status: AC
  Filled 2019-04-07: qty 20

## 2019-04-07 MED ORDER — PHENYLEPHRINE HCL 10 % OP SOLN
1.0000 [drp] | OPHTHALMIC | Status: AC | PRN
  Administered 2019-04-07 (×3): 1 [drp] via OPHTHALMIC
  Filled 2019-04-07: qty 5

## 2019-04-07 MED ORDER — BUPIVACAINE HCL (PF) 0.75 % IJ SOLN
INTRAMUSCULAR | Status: AC
  Filled 2019-04-07: qty 30

## 2019-04-07 MED ORDER — GATIFLOXACIN 0.5 % OP SOLN OPTIME - NO CHARGE
OPHTHALMIC | Status: DC | PRN
  Administered 2019-04-07: 1 [drp] via OPHTHALMIC

## 2019-04-07 MED ORDER — DORZOLAMIDE HCL-TIMOLOL MAL 2-0.5 % OP SOLN
OPHTHALMIC | Status: DC | PRN
  Administered 2019-04-07: 1 [drp] via OPHTHALMIC

## 2019-04-07 MED ORDER — INDOCYANINE GREEN 25 MG IV SOLR
INTRAVENOUS | Status: AC
  Filled 2019-04-07: qty 25

## 2019-04-07 MED ORDER — LIDOCAINE 2% (20 MG/ML) 5 ML SYRINGE
INTRAMUSCULAR | Status: AC
  Filled 2019-04-07: qty 5

## 2019-04-07 MED ORDER — ATROPINE SULFATE 1 % OP SOLN
OPHTHALMIC | Status: DC | PRN
  Administered 2019-04-07: 1 [drp] via OPHTHALMIC

## 2019-04-07 MED ORDER — ROCURONIUM BROMIDE 10 MG/ML (PF) SYRINGE
PREFILLED_SYRINGE | INTRAVENOUS | Status: DC | PRN
  Administered 2019-04-07: 20 mg via INTRAVENOUS
  Administered 2019-04-07: 30 mg via INTRAVENOUS

## 2019-04-07 MED ORDER — STERILE WATER FOR INJECTION IJ SOLN
INTRAMUSCULAR | Status: DC | PRN
  Administered 2019-04-07: 20 mL

## 2019-04-07 MED ORDER — TROPICAMIDE 1 % OP SOLN
1.0000 [drp] | OPHTHALMIC | Status: AC | PRN
  Administered 2019-04-07 (×3): 1 [drp] via OPHTHALMIC
  Filled 2019-04-07: qty 15

## 2019-04-07 MED ORDER — ONDANSETRON HCL 4 MG/2ML IJ SOLN: 4.0000 mg | Freq: Once | INTRAMUSCULAR | Status: DC | PRN

## 2019-04-07 MED ORDER — ROCURONIUM BROMIDE 10 MG/ML (PF) SYRINGE
PREFILLED_SYRINGE | INTRAVENOUS | Status: AC
  Filled 2019-04-07: qty 10

## 2019-04-07 MED ORDER — FENTANYL CITRATE (PF) 250 MCG/5ML IJ SOLN
INTRAMUSCULAR | Status: DC | PRN
  Administered 2019-04-07: 25 ug via INTRAVENOUS
  Administered 2019-04-07: 100 ug via INTRAVENOUS

## 2019-04-07 MED ORDER — TRIAMCINOLONE ACETONIDE 40 MG/ML IJ SUSP
INTRAMUSCULAR | Status: DC | PRN
  Administered 2019-04-07: 40 mg

## 2019-04-07 MED ORDER — FENTANYL CITRATE (PF) 100 MCG/2ML IJ SOLN
INTRAMUSCULAR | Status: AC
  Filled 2019-04-07: qty 2

## 2019-04-07 MED ORDER — EPINEPHRINE PF 1 MG/ML IJ SOLN
INTRAOCULAR | Status: DC | PRN
  Administered 2019-04-07: 500 mL

## 2019-04-07 MED ORDER — NA CHONDROIT SULF-NA HYALURON 40-30 MG/ML IO SOLN
INTRAOCULAR | Status: DC | PRN
  Administered 2019-04-07: 0.5 mL via INTRAOCULAR

## 2019-04-07 MED ORDER — SUGAMMADEX SODIUM 200 MG/2ML IV SOLN
INTRAVENOUS | Status: DC | PRN
  Administered 2019-04-07: 150 mg via INTRAVENOUS

## 2019-04-07 MED ORDER — STERILE WATER FOR INJECTION IJ SOLN
INTRAMUSCULAR | Status: AC
  Filled 2019-04-07: qty 10

## 2019-04-07 MED ORDER — INDOCYANINE GREEN 25 MG IV SOLR
INTRAVENOUS | Status: DC | PRN
  Administered 2019-04-07: .7 mg via OPHTHALMIC

## 2019-04-07 MED ORDER — NA CHONDROIT SULF-NA HYALURON 40-30 MG/ML IO SOLN
INTRAOCULAR | Status: AC
  Filled 2019-04-07: qty 0.5

## 2019-04-07 MED ORDER — ATROPINE SULFATE 1 % OP SOLN
OPHTHALMIC | Status: AC
  Filled 2019-04-07: qty 5

## 2019-04-07 MED ORDER — SODIUM CHLORIDE (PF) 0.9 % IJ SOLN
INTRAMUSCULAR | Status: AC
  Filled 2019-04-07: qty 10

## 2019-04-07 MED ORDER — POLYMYXIN B SULFATE 500000 UNITS IJ SOLR
INTRAMUSCULAR | Status: AC
  Filled 2019-04-07: qty 500000

## 2019-04-07 MED ORDER — ONDANSETRON HCL 4 MG/2ML IJ SOLN
INTRAMUSCULAR | Status: DC | PRN
  Administered 2019-04-07: 4 mg via INTRAVENOUS

## 2019-04-07 MED ORDER — SODIUM CHLORIDE 0.9 % IV SOLN
INTRAVENOUS | Status: DC | PRN
  Administered 2019-04-07: 30 ug/min via INTRAVENOUS

## 2019-04-07 MED ORDER — DEXAMETHASONE SODIUM PHOSPHATE 10 MG/ML IJ SOLN
INTRAMUSCULAR | Status: DC | PRN
  Administered 2019-04-07: 5 mg via INTRAVENOUS

## 2019-04-07 SURGICAL SUPPLY — 76 items
APPLICATOR COTTON TIP 6 STRL (MISCELLANEOUS) ×4 IMPLANT
APPLICATOR COTTON TIP 6IN STRL (MISCELLANEOUS) ×8
BLADE EYE CATARACT 19 1.4 BEAV (BLADE) IMPLANT
BLADE MVR KNIFE 20G (BLADE) ×2 IMPLANT
BNDG EYE OVAL (GAUZE/BANDAGES/DRESSINGS) ×2 IMPLANT
CABLE BIPOLOR RESECTION CORD (MISCELLANEOUS) ×2 IMPLANT
CANNULA ANT CHAM MAIN (OPHTHALMIC RELATED) IMPLANT
CANNULA FLEX TIP 25G (CANNULA) ×2 IMPLANT
CANNULA TROCAR 23 GA VLV (OPHTHALMIC) IMPLANT
CANNULA TROCAR 23G VLV (OPHTHALMIC) IMPLANT
CANNULA VLV SOFT TIP 25G (OPHTHALMIC) IMPLANT
CANNULA VLV SOFT TIP 25GA (OPHTHALMIC) IMPLANT
CLSR STERI-STRIP ANTIMIC 1/2X4 (GAUZE/BANDAGES/DRESSINGS) ×2 IMPLANT
COVER WAND RF STERILE (DRAPES) ×2 IMPLANT
DRAPE MICROSCOPE LEICA 46X105 (MISCELLANEOUS) ×2 IMPLANT
DRAPE OPHTHALMIC 77X100 STRL (CUSTOM PROCEDURE TRAY) ×2 IMPLANT
ERASER HMR WETFIELD 23G BP (MISCELLANEOUS) IMPLANT
FILTER BLUE MILLIPORE (MISCELLANEOUS) IMPLANT
FILTER STRAW FLUID ASPIR (MISCELLANEOUS) IMPLANT
FORCEPS GRIESHABER ILM 25G A (INSTRUMENTS) IMPLANT
GAS AUTO FILL CONSTEL (OPHTHALMIC) ×2
GAS AUTO FILL CONSTELLATION (OPHTHALMIC) IMPLANT
GLOVE BIO SURGEON STRL SZ7.5 (GLOVE) ×4 IMPLANT
GLOVE BIOGEL M 7.0 STRL (GLOVE) ×2 IMPLANT
GOWN STRL REUS W/ TWL LRG LVL3 (GOWN DISPOSABLE) ×2 IMPLANT
GOWN STRL REUS W/ TWL XL LVL3 (GOWN DISPOSABLE) ×1 IMPLANT
GOWN STRL REUS W/TWL LRG LVL3 (GOWN DISPOSABLE) ×2
GOWN STRL REUS W/TWL XL LVL3 (GOWN DISPOSABLE) ×1
KIT BASIN OR (CUSTOM PROCEDURE TRAY) ×2 IMPLANT
KIT PERFLUORON PROCEDURE 5ML (MISCELLANEOUS) IMPLANT
LENS BIOM SUPER VIEW SET DISP (MISCELLANEOUS) IMPLANT
LENS VITRECTOMY FLAT OCLR DISP (MISCELLANEOUS) IMPLANT
MICROPICK 25G (MISCELLANEOUS)
NDL 18GX1X1/2 (RX/OR ONLY) (NEEDLE) ×1 IMPLANT
NDL 25GX 5/8IN NON SAFETY (NEEDLE) ×5 IMPLANT
NDL HYPO 30X.5 LL (NEEDLE) ×2 IMPLANT
NDL PRECISIONGLIDE 27X1.5 (NEEDLE) IMPLANT
NEEDLE 18GX1X1/2 (RX/OR ONLY) (NEEDLE) ×2 IMPLANT
NEEDLE 25GX 5/8IN NON SAFETY (NEEDLE) ×10 IMPLANT
NEEDLE HYPO 30X.5 LL (NEEDLE) ×4 IMPLANT
NEEDLE PRECISIONGLIDE 27X1.5 (NEEDLE) IMPLANT
NS IRRIG 1000ML POUR BTL (IV SOLUTION) ×2 IMPLANT
PACK VITRECTOMY CUSTOM (CUSTOM PROCEDURE TRAY) ×2 IMPLANT
PAD ARMBOARD 7.5X6 YLW CONV (MISCELLANEOUS) ×4 IMPLANT
PAK PIK VITRECTOMY CVS 25GA (OPHTHALMIC) ×2 IMPLANT
PENCIL BIPOLAR 25GA STR DISP (OPHTHALMIC RELATED) ×2 IMPLANT
PICK MICROPICK 25G (MISCELLANEOUS) IMPLANT
PROBE DIATHERMY DSP 27GA (MISCELLANEOUS) IMPLANT
PROBE ENDO DIATHERMY 25G (MISCELLANEOUS) ×2 IMPLANT
PROBE LASER ILLUM FLEX CVD 25G (OPHTHALMIC) IMPLANT
REPL STRA BRUSH NDL (NEEDLE) IMPLANT
REPL STRA BRUSH NEEDLE (NEEDLE) IMPLANT
RESERVOIR BACK FLUSH (MISCELLANEOUS) IMPLANT
RETRACTOR IRIS FLEX 25G GRIESH (INSTRUMENTS) IMPLANT
SHIELD EYE LENSE ONLY DISP (GAUZE/BANDAGES/DRESSINGS) ×1 IMPLANT
SPONGE SURGIFOAM ABS GEL 12-7 (HEMOSTASIS) IMPLANT
STOPCOCK 4 WAY LG BORE MALE ST (IV SETS) IMPLANT
SUT CHROMIC 7 0 TG140 8 (SUTURE) ×2 IMPLANT
SUT ETHILON 5.0 S-24 (SUTURE) ×2 IMPLANT
SUT ETHILON 9 0 TG140 8 (SUTURE) ×2 IMPLANT
SUT MERSILENE 4 0 RV 2 (SUTURE) ×2 IMPLANT
SUT SILK 2 0 (SUTURE) ×1
SUT SILK 2 0 TIES 17X18 (SUTURE) ×1
SUT SILK 2-0 18XBRD TIE 12 (SUTURE) ×1 IMPLANT
SUT SILK 2-0 18XBRD TIE BLK (SUTURE) ×1 IMPLANT
SUT SILK 4 0 RB 1 (SUTURE) ×2 IMPLANT
SUT VICRYL 7 0 TG140 8 (SUTURE) ×2 IMPLANT
SYR 10ML LL (SYRINGE) ×2 IMPLANT
SYR 20CC LL (SYRINGE) ×2 IMPLANT
SYR 5ML LL (SYRINGE) ×2 IMPLANT
SYR BULB 3OZ (MISCELLANEOUS) ×2 IMPLANT
SYR TB 1ML LUER SLIP (SYRINGE) IMPLANT
TAPE PAPER 2X10 WHT MICROPORE (GAUZE/BANDAGES/DRESSINGS) ×1 IMPLANT
TOWEL NATURAL 6PK STERILE (DISPOSABLE) ×2 IMPLANT
TUBING HIGH PRESS EXTEN 6IN (TUBING) ×2 IMPLANT
WATER STERILE IRR 1000ML POUR (IV SOLUTION) ×2 IMPLANT

## 2019-04-07 NOTE — Anesthesia Postprocedure Evaluation (Signed)
Anesthesia Post Note  Patient: Lindsay Harvey  Procedure(s) Performed: 25 GAUGE PARS PLANA VITRECTOMY WITH 20 GAUGE MVR PORT FOR MACULAR HOLE (Right Eye) Membrane Peel (Right Eye) Gas/Fluid Exchange (Right Eye) Photocoagulation With Laser (Right Eye)     Patient location during evaluation: PACU Anesthesia Type: General Level of consciousness: awake and alert Pain management: pain level controlled Vital Signs Assessment: post-procedure vital signs reviewed and stable Respiratory status: spontaneous breathing, nonlabored ventilation, respiratory function stable and patient connected to nasal cannula oxygen Cardiovascular status: blood pressure returned to baseline and stable Postop Assessment: no apparent nausea or vomiting Anesthetic complications: no    Last Vitals:  Vitals:   04/07/19 1435 04/07/19 1450  BP: 135/83 (!) 146/69  Pulse: 75 72  Resp: 13 14  Temp:  36.9 C  SpO2: 96% 97%    Last Pain:  Vitals:   04/07/19 1445  PainSc: 3                  Liliauna Santoni COKER

## 2019-04-07 NOTE — Anesthesia Preprocedure Evaluation (Signed)

## 2019-04-07 NOTE — Anesthesia Procedure Notes (Signed)
Procedure Name: Intubation Date/Time: 04/07/2019 11:51 AM Performed by: Julieta Bellini, CRNA Pre-anesthesia Checklist: Patient identified, Emergency Drugs available, Suction available and Patient being monitored Patient Re-evaluated:Patient Re-evaluated prior to induction Oxygen Delivery Method: Circle system utilized Preoxygenation: Pre-oxygenation with 100% oxygen Induction Type: IV induction Ventilation: Mask ventilation without difficulty Laryngoscope Size: Glidescope and 3 Grade View: Grade I Tube type: Oral Tube size: 7.0 mm Number of attempts: 1 Airway Equipment and Method: Oral airway,  Video-laryngoscopy and Rigid stylet Placement Confirmation: ETT inserted through vocal cords under direct vision,  positive ETCO2 and breath sounds checked- equal and bilateral Secured at: 21 cm Tube secured with: Tape Dental Injury: Teeth and Oropharynx as per pre-operative assessment  Difficulty Due To: Difficult Airway- due to anterior larynx Comments: S. Wanette Robison DLx1 attempt with Mac 3, Grade 3 view. Dr. Roanna Banning attempt DLx1 with Sabra Heck 2, Grade 3 view. S.Masaichi Kracht used Glidescope Blade 3 with Grade1 view x1 attempt

## 2019-04-07 NOTE — Discharge Instructions (Addendum)
POSTOPERATIVE INSTRUCTIONS  Your doctor has performed vitreoretinal surgery on you at [facility].  - Keep eye patched and shielded until seen by Dr. Zamora 815 AM tomorrow in clinic - Do not use drops until return - FACE DOWN POSITIONING WHILE AWAKE - Sleep with belly down or on left side, avoid laying flat on back.    - No strenuous bending, stooping or lifting.  - You may not drive until further notice.  - If your doctor used a gas bubble in your eye during the procedure he will advise you on postoperative positioning. If you have a gas bubble you will be wearing a green bracelet that was applied in the operating room. The green bracelet should stay on as long as the gas bubble is in your eye. While the gas bubble is present you should not fly in an airplane. If you require general anesthesia while the gas bubble is present you must notify your anesthesiologist that an intraocular gas bubble is present so he can take the appropriate precautions.  - Tylenol or any other over-the-counter pain reliever can be used according to your doctor. If more pain medicine is required, your doctor will have a prescription for you.  - You may read, go up and down stairs, and watch television.     Brian Zamora, M.D., Ph.D.  

## 2019-04-07 NOTE — Brief Op Note (Signed)
04/07/2019  1:59 PM  PATIENT:  Sallye Lat  83 y.o. female  PRE-OPERATIVE DIAGNOSIS:  vitreomacular traction with macular hole, right eye  POST-OPERATIVE DIAGNOSIS:  vitreomacular traction with macular hole, right eye  PROCEDURE:  Procedure(s): 25 GAUGE PARS PLANA VITRECTOMY WITH 20 GAUGE MVR PORT FOR MACULAR HOLE (Right) Membrane Peel (Right) Gas/Fluid Exchange (Right)  SURGEON:  Surgeon(s) and Role:    [name], MD - Primary  ASSISTANTS: Ernest Mallick, Ophthalmic Assistant  ANESTHESIA:   general  EBL:  1 mL   BLOOD ADMINISTERED:none  DRAINS: none   LOCAL MEDICATIONS USED:  NONE  SPECIMEN:  No Specimen  DISPOSITION OF SPECIMEN:  N/A  COUNTS:  YES  TOURNIQUET:  * No tourniquets in log *  DICTATION: .Note written in EPIC  PLAN OF CARE: Discharge to home after PACU  PATIENT DISPOSITION:  PACU - hemodynamically stable.   Delay start of Pharmacological VTE agent (>24hrs) due to surgical blood loss or risk of bleeding: not applicable

## 2019-04-07 NOTE — Interval H&P Note (Signed)
History and Physical Interval Note:  04/07/2019 10:48 AM  Lindsay Harvey  has presented today for surgery, with the diagnosis of vitreomacular traction with macular hole, right eye.  The various methods of treatment have been discussed with the patient and family. After consideration of risks, benefits and other options for treatment, the patient has consented to  Procedure(s): 25 GAUGE PARS PLANA VITRECTOMY WITH 20 GAUGE MVR PORT FOR MACULAR HOLE (Right) as a surgical intervention.  The patient's history has been reviewed, patient examined, no change in status, stable for surgery.  I have reviewed the patient's chart and labs.  Questions were answered to the patient's satisfaction.     Lindsay Harvey

## 2019-04-07 NOTE — Op Note (Signed)
Date of procedure:  04/07/2019  Surgeon: [name], MD, PhD  Assistant: Ernest Mallick, Ophthalmic Assistant  Pre-operative Diagnoses: Vitreomacular Traction, Right Eye Macular cyst, Right Eye  Post-operative diagnosis: Vitreomacular Traction, Right Eye Macular cyst, Right Eye  Anesthesia: General  Procedure:  1. 25 gauge pars plana vitrectomy, Right Eye 2. Indocyanine green stain, Right Eye 3. Internal Limiting Membrane peel, Right Eye 4. Endolaser, Right Eye 5. Injection 14% C3F8, Right Eye   Indications for procedure: The patient presented with progressive vitreomacular traction syndrome with macular cyst and complaint of decreasing vision OD. After discussing the risks, benefits, and alternatives to surgery, the patient electively decided to undergo surgical repair and informed consent was obtained. The surgery was an attempt to close the relieve the vitreomacular traction, repair the macular cyst and potentially improve the vision within the reasonable expectations of the surgeon.  Procedure in Detail:  The patient was met in the pre-operative holding area where their identification data was verified. It was noted that there was a signed, informed consent in the chart and the Rightt Eye eye was verbally verified by the patient as the operative eye and was marked with a marking pen.The patient was then taken to the operating room and placed in the supine position. General endotracheal anesthesia was induced.  The eye was then prepped with 5% betadine and draped in the normal fashion for ophthalmic surgery. The microscope was draped and swung into position, and a secondary time-out was performed to identify the correct patient, eyes, procedures, and any allergies.  A 25 gauge trocar was inserted in a 30-45 degrees fashion into the inferotemporal quadrant 3.5 mm posterior to the limbus in this pseudophakic patient. Correct positioning within the vitreous was verified  externally with the light pipe. The infusion was then connected to the cannula and BSS infusion was commenced. Additional ports were placed in the superonasal and superotemporal quadrants.Viscoat was placed on the cornea and direct vitrectomy was performed. The BIOM was used to visualize the posterior segment while the core vitrectomy was completed. The patient had a significant central macular cyst and vitreomacular traction. A posterior vitreous detachment was induced using suction over the optic nerve head and lifting anteriorly. The remaining vitreous was removed. Kenalog was used to aid in this process. A thorough peripheral vitrectomy was performed.   Pre-diluted indocyanine green was then used to stain the internal limiting membrane. A macular contact lens was placed on the eye. End-grasping ILM forceps were used to create an opening in the ILM and the ILM was peeled fully from the macula taking care to avoid traction on the fovea and macular cyst.   The wide angle viewing system was brought back into position. Scleral depression was performed and used to meticulously shave the thick and adherent vitreous base. No retinal tears were noted on inspection, however, there was a peripheral vitreoretinal tuft at the 1 oclock peripheral position. Endolaser was used to surround this area with three rows of laser spots plus additional superior 180 prophylactic endolaser. An air fluid exchange was performed.  The superotemporal port was then removed and sutured with 7-0 vicryl, there was no leakage. 14% C3F8 gas was connected to the infusion line and gas was injected into the posterior segment while venting air through the superonasal trocar using the extrusion cannula. Once a full, 40cc of gas was vented through the eye, the infusion port and venting ports were removed and they were sutured with 7-0 vicryl. There was no leakage from the  sclerotomy sites.  Subconjunctival injections of kefzol +  bacitracin + polymixin b and kenalog were then administered, and antibiotic and steroid drops as well as antibiotic ointment were placed in the eye. The drapes were removed and the eye was patched and shielded. A green gas bracelet was placed on the patient's left wrist. The patient was then taken to the post-operative area for recovery having tolerated the procedure well. She was instructed to perform face down positioning postoperatively and to follow up in clinic the following morning as scheduled.  Estimate blood lost: none Complications: None

## 2019-04-07 NOTE — Transfer of Care (Signed)
Immediate Anesthesia Transfer of Care Note  Patient: Lindsay Harvey  Procedure(s) Performed: 25 GAUGE PARS PLANA VITRECTOMY WITH 20 GAUGE MVR PORT FOR MACULAR HOLE (Right Eye) Membrane Peel (Right Eye) Gas/Fluid Exchange (Right Eye)  Patient Location: PACU  Anesthesia Type:General  Level of Consciousness: drowsy and patient cooperative  Airway & Oxygen Therapy: Patient Spontanous Breathing and Patient connected to nasal cannula oxygen  Post-op Assessment: Report given to RN, Post -op Vital signs reviewed and stable and Patient moving all extremities X 4  Post vital signs: Reviewed and stable  Last Vitals:  Vitals Value Taken Time  BP 134/68 04/07/19 1351  Temp    Pulse 81 04/07/19 1351  Resp 12 04/07/19 1351  SpO2 98 % 04/07/19 1351  Vitals shown include unvalidated device data.  Last Pain:  Vitals:   04/07/19 0932  PainSc: 0-No pain         Complications: No apparent anesthesia complications

## 2019-04-08 ENCOUNTER — Encounter (HOSPITAL_COMMUNITY): Payer: Self-pay | Admitting: Ophthalmology

## 2019-04-08 ENCOUNTER — Other Ambulatory Visit: Payer: Self-pay

## 2019-04-08 ENCOUNTER — Ambulatory Visit (INDEPENDENT_AMBULATORY_CARE_PROVIDER_SITE_OTHER): Payer: Medicare HMO | Admitting: Ophthalmology

## 2019-04-08 DIAGNOSIS — H43821 Vitreomacular adhesion, right eye: Secondary | ICD-10-CM

## 2019-04-08 DIAGNOSIS — H43393 Other vitreous opacities, bilateral: Secondary | ICD-10-CM

## 2019-04-08 DIAGNOSIS — H353211 Exudative age-related macular degeneration, right eye, with active choroidal neovascularization: Secondary | ICD-10-CM

## 2019-04-08 DIAGNOSIS — H43823 Vitreomacular adhesion, bilateral: Secondary | ICD-10-CM

## 2019-04-08 DIAGNOSIS — Z961 Presence of intraocular lens: Secondary | ICD-10-CM

## 2019-04-08 DIAGNOSIS — H353121 Nonexudative age-related macular degeneration, left eye, early dry stage: Secondary | ICD-10-CM

## 2019-04-08 DIAGNOSIS — H3581 Retinal edema: Secondary | ICD-10-CM

## 2019-04-10 ENCOUNTER — Encounter (INDEPENDENT_AMBULATORY_CARE_PROVIDER_SITE_OTHER): Payer: Self-pay | Admitting: Ophthalmology

## 2019-04-13 NOTE — Progress Notes (Signed)
[facility] Note  04/15/2019     CHIEF COMPLAINT Patient presents for Post-op Follow-up   HISTORY OF PRESENT ILLNESS: Lindsay Harvey is a 83 y.o. female who presents to the clinic today for:   HPI    Post-op Follow-up    In right eye.  Vision is blurred at distance.  I, the attending physician,  performed the HPI with the patient and updated documentation appropriately.          Comments    Patient states her vision in her right eye is still very blurry.  She states she sees a bubble in the way of her vision OD.  Patient denies eye pain or discomfort.  ([place] interpreter present today)       Last edited by [name], MD on 04/15/2019  8:25 AM. (History)    pt states she had a little bit of a hard time last night after sx, she states her eye is in a little bit of pain this morning, she states she took some tylenol and it seemed to help  Referring physician: Jani Gravel, MD [address] [place],  [place] [address]  HISTORICAL INFORMATION:   Selected notes from the [facility] Referred by Dr. [name] for concern of AMD OU, CNV;  LEE- 11.20.18 (C. [name]) [BCVA OD: 20/40 OS 20/30] Ocular Hx- DES OU, pseudophakia OU, keratoconjunctivitis sicca OU PMH- high chol.    CURRENT MEDICATIONS: Current Outpatient Medications (Ophthalmic Drugs)  Medication Sig  . carboxymethylcellulose (REFRESH PLUS) 0.5 % SOLN Place 1 drop into both eyes 3 (three) times daily as needed (dry/irritated eyes).   . prednisoLONE acetate (PRED FORTE) 1 % ophthalmic suspension Place 1 drop into the right eye 6 (six) times daily.  Marland Kitchen REFRESH OPTIVE MEGA-3 0.5-1-0.5 % SOLN Place 1 drop into both eyes 3 (three) times daily as needed (discomfort/eye irritation.).   No current facility-administered medications for this visit.  (Ophthalmic Drugs)   Current Outpatient Medications (Other)  Medication Sig  . calcium carbonate (OSCAL) 1500 (600 Ca) MG TABS  tablet Take 600 mg of elemental calcium by mouth 2 (two) times a day.  . Cholecalciferol (VITAMIN D3) 50 MCG (2000 UT) TABS Take 2,000 Units by mouth daily.  . Multiple Vitamins-Minerals (EMERGEN-C IMMUNE PO) Take 1 packet by mouth daily as needed (for immune support).  . Multiple Vitamins-Minerals (PRESERVISION AREDS 2 PO) Take 1 tablet by mouth 2 (two) times a day.   . simvastatin (ZOCOR) 40 MG tablet Take 10 mg by mouth every evening. 1/4 tablet at night   Current Facility-Administered Medications (Other)  Medication Route  . Bevacizumab (AVASTIN) SOLN 1.25 mg Intravitreal  . Bevacizumab (AVASTIN) SOLN 1.25 mg Intravitreal  . Bevacizumab (AVASTIN) SOLN 1.25 mg Intravitreal  . Bevacizumab (AVASTIN) SOLN 1.25 mg Intravitreal  . Bevacizumab (AVASTIN) SOLN 1.25 mg Intravitreal  . Bevacizumab (AVASTIN) SOLN 1.25 mg Intravitreal  . Bevacizumab (AVASTIN) SOLN 1.25 mg Intravitreal  . Bevacizumab (AVASTIN) SOLN 1.25 mg Intravitreal  . Bevacizumab (AVASTIN) SOLN 1.25 mg Intravitreal  . Bevacizumab (AVASTIN) SOLN 1.25 mg Intravitreal  . Bevacizumab (AVASTIN) SOLN 1.25 mg Intravitreal  . Bevacizumab (AVASTIN) SOLN 1.25 mg Intravitreal  . Bevacizumab (AVASTIN) SOLN 1.25 mg Intravitreal      REVIEW OF SYSTEMS: ROS    Positive for: Genitourinary, Eyes   Negative for: Constitutional, Gastrointestinal, Neurological, Skin, Musculoskeletal, HENT, Endocrine, Cardiovascular, Respiratory, Psychiatric, Allergic/Imm, Heme/Lymph   Last edited by Doneen Poisson  on 04/15/2019  8:12 AM. (History)       ALLERGIES No Known Allergies  PAST MEDICAL HISTORY Past Medical History:  Diagnosis Date  . Allergies   . Cataract 2013   OU  . 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 Headache   . Hypercholesterolemia   . Macular degeneration    Wet OD, Dry OS  . Wears glasses    Past Surgical History:  Procedure Laterality Date  . 25 GAUGE PARS PLANA VITRECTOMY WITH 20 GAUGE  MVR PORT FOR MACULAR HOLE Right 04/07/2019   Procedure: 25 GAUGE PARS PLANA VITRECTOMY WITH 20 GAUGE MVR PORT FOR MACULAR HOLE;  Surgeon: Rennis ChrisZamora, Aja Whitehair, MD;  [facility];  Service: Ophthalmology;  Laterality: Right;  . CATARACT EXTRACTION     2013  . DILATION AND CURETTAGE OF UTERUS    . EYE SURGERY    . GAS/FLUID EXCHANGE Right 04/07/2019   Procedure: Gas/Fluid Exchange;  Surgeon: Rennis ChrisZamora, Fatisha Rabalais, MD;  [facility];  Service: Ophthalmology;  Laterality: Right;  . MEMBRANE PEEL Right 04/07/2019   Procedure: Eula FlaxMembrane Peel;  Surgeon: Rennis ChrisZamora, Maija Biggers, MD;  [facility];  Service: Ophthalmology;  Laterality: Right;  . PHOTOCOAGULATION WITH LASER Right 04/07/2019   Procedure: Photocoagulation With Laser;  Surgeon: Rennis ChrisZamora, Robt Okuda, MD;  [facility];  Service: Ophthalmology;  Laterality: Right;    FAMILY HISTORY Family History  Problem Relation Age of Onset  . Stroke Father     SOCIAL HISTORY Social History   Tobacco Use  . Smoking status: Never Smoker  . Smokeless tobacco: Never Used  Substance Use Topics  . Alcohol use: No    Frequency: Never  . Drug use: No         OPHTHALMIC EXAM:  Base Eye Exam    Visual Acuity (Snellen - Linear)      Right Left   Dist cc CF @ 2' 20/30 +2   Dist ph cc NI NI   Correction: Glasses       Tonometry (Applanation, 8:13 AM)      Right Left   Pressure 9 11       Pupils      Dark Light Shape React APD   Right 5 5 Round non-reactive 0   Left 3 2 Round Brisk 0       Extraocular Movement      Right Left    Full Full       Neuro/Psych    Oriented x3: Yes   Mood/Affect: Normal       Dilation    Right eye: 1.0% Mydriacyl, 2.5% Phenylephrine @ 8:13 AM        Slit Lamp and Fundus Exam    Slit Lamp Exam      Right Left   Lids/Lashes Dermatochalasis - upper lid, periorbital edema Dermatochalasis - upper lid   Conjunctiva/Sclera Subconjunctival hemorrhage superotemporal quad-improving, sutures intact White and quiet   Cornea  Arcus, 1+ Punctate epithelial erosions, tr Descemet's folds, trace endopigment Arcus, Inferior 1+ Punctate epithelial erosions   Anterior Chamber 1/2+cell/pigment Deep and quiet   Iris Round and dilated Round and dilated   Lens PC IOL in good postion with anterior capsular phimosis, 1+ Posterior capsular opacification PC IOL in good postion with anterior capsular phimosis, 1+ Posterior capsular opacification sparing center   Vitreous post vitrectomy, gas bubble 90 % Vitreous syneresis       Fundus Exam  Right Left   Disc Pink and Sharp, peripapillary drusen Normal   C/D Ratio 0.4 0.6   Macula flat under gas, ERM/VMT gone, drusen    Vessels Vascular attenuation    Periphery Attached, scattered peripheral drusen, peripapillary drusen, laser changes superiorly         Refraction    Wearing Rx      Sphere Cylinder Axis Add   Right -1.00 +0.75 180 +2.75   Left -0.50 +0.75 004 +2.75          IMAGING AND PROCEDURES  Imaging and Procedures for 02/09/18           ASSESSMENT/PLAN:    ICD-10-CM   1. Exudative age-related macular degeneration of right eye with active choroidal neovascularization (HCC)  H35.3211   2. 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 Retinal edema  H35.81   5. Pseudophakia of both eyes  Z96.1   6. 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 Vitreomacular adhesion of right eye  H43.821     1. 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    - S/P IVA OD #1 (12.03.18), #2 (01.02.19), #3 (01.30.19), #4 (03.01.19), #5 (04.01.19), #6 (04.30.19), #7 (06.05.19), #8 (07.19.19), #9 (08.30.19), #10 (10.14.19), #11 (11.18.19), #12 (12.30.19), #13 (02.10.20), #14 (04.06.20)  - has had good response to IVA  - intraretinal heme improved on exam  - FA (04.01.19) shows mild CNVM OD consistent with exudative ARMD  2. Age  related macular degeneration, non-exudative, OS  - The incidence, anatomy, and pathology of dry AMD, risk of progression, and the AREDS and AREDS 2 study including smoking risks discussed with patient.  - Continue amsler grid monitoring  - will continue to monitor for conversion to exudative ARMD  3,4. 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)   - Pre-op: VMT continues to worsen OD -- macular cyst increasing in volume and height  - Pre-op BCVA OD 20/80 from 20/70 from 20/60 -- progressive decline  - OS stable  - now POW1 s/p PPV/ICG/MP/14% C3F8 OD, 06.25.2020             - doing well             - retina attached w/ VMT relieved and good gas bubble in place             - IOP 9  - gas bubble at 90%             - cont PF 6x/day OD                         zymaxid QID OD -- stop when bottle runs out                          Atropine BID OD -- stop when bottle runs out                         PSO ung QID OD   - dec Brimonidine qdaily OD             - cont face down positioning 50% of time  - avoid laying flat on back              - eye shield when sleeping x1 more week              -  post op drop and positioning instructions reviewed              - tylenol/ibuprofen for pain  - f/u 3 week  5. Pseudophakia OU  - s/p CE/IOL OU  - s/p YAG OU  - beautiful surgeries, doing well  - monitor  6. Vitreous syneresis OU  - Pt with intermittent floaters OU  - No frank PVD on exam  - Discussed findings and prognosis  - No RT or RD on 360 peripheral exam  - Reviewed s/s of RT/RD  - Strict return precautions for any such RT/RD signs/symptoms   Ophthalmic Meds Ordered this visit:  Meds ordered this encounter  Medications  . DISCONTD: prednisoLONE acetate (PRED FORTE) 1 % ophthalmic suspension    Sig: Place 1 drop into the right eye 6 (six) times daily.    Dispense:  15 mL    Refill:  0  . DISCONTD: prednisoLONE acetate (PRED FORTE) 1 % ophthalmic suspension    Sig: Place 1 drop into the right  eye 6 (six) times daily.    Dispense:  15 mL    Refill:  0  . prednisoLONE acetate (PRED FORTE) 1 % ophthalmic suspension    Sig: Place 1 drop into the right eye 6 (six) times daily.    Dispense:  15 mL    Refill:  0       Return in about 3 weeks (around 05/06/2019).  There are no Patient Instructions on file for this visit.   Explained the diagnoses, plan, and follow up with the patient and they expressed understanding.  Patient expressed understanding of the importance of proper follow up care.   This document serves as a record of services personally performed by Karie ChimeraBrian G. Aul Mangieri, MD, PhD. It was created on their behalf by Laurian BrimAmanda Brown, OA, an ophthalmic assistant. The creation of this record is the provider's dictation and/or activities during the visit.    Electronically signed by: Laurian BrimAmanda Brown, OA  07.01.2020 11:45 AM    Karie ChimeraBrian G. Lesli Issa, M.D., Ph.D. Diseases & Surgery of the Retina and Vitreous [facility]  I have reviewed the above documentation for accuracy and completeness, and I agree with the above. Karie ChimeraBrian G. Ivan Lacher, M.D., Ph.D. 04/15/19 11:45 AM    Abbreviations: M myopia (nearsighted); A astigmatism; H hyperopia (farsighted); P presbyopia; Mrx spectacle prescription;  CTL contact lenses; OD right eye; OS left eye; OU both eyes  XT exotropia; ET esotropia; PEK punctate epithelial keratitis; PEE punctate epithelial erosions; DES dry eye syndrome; MGD meibomian gland dysfunction; ATs artificial tears; PFAT's preservative free artificial tears; NSC nuclear sclerotic cataract; PSC posterior subcapsular cataract; ERM epi-retinal membrane; PVD posterior vitreous detachment; RD retinal detachment; DM diabetes mellitus; DR diabetic retinopathy; NPDR non-proliferative diabetic retinopathy; PDR proliferative diabetic retinopathy; CSME clinically significant macular edema; DME diabetic macular edema; dbh dot blot hemorrhages; CWS cotton wool spot; POAG primary  open angle glaucoma; C/D cup-to-disc ratio; HVF humphrey visual field; GVF goldmann visual field; OCT optical coherence tomography; IOP intraocular pressure; BRVO Branch retinal vein occlusion; CRVO central retinal vein occlusion; CRAO central retinal artery occlusion; BRAO branch retinal artery occlusion; RT retinal tear; SB scleral buckle; PPV pars plana vitrectomy; VH Vitreous hemorrhage; PRP panretinal laser photocoagulation; IVK intravitreal kenalog; VMT vitreomacular traction; MH Macular hole;  NVD neovascularization of the disc; NVE neovascularization elsewhere; AREDS age related eye disease study; ARMD age related macular degeneration; POAG primary open angle glaucoma; EBMD epithelial/anterior basement membrane dystrophy; ACIOL  anterior chamber intraocular lens; IOL intraocular lens; PCIOL posterior chamber intraocular lens; Phaco/IOL phacoemulsification with intraocular lens placement; Garner photorefractive keratectomy; LASIK laser assisted in situ keratomileusis; HTN hypertension; DM diabetes mellitus; COPD chronic obstructive pulmonary disease

## 2019-04-15 ENCOUNTER — Other Ambulatory Visit: Payer: Self-pay

## 2019-04-15 ENCOUNTER — Encounter (INDEPENDENT_AMBULATORY_CARE_PROVIDER_SITE_OTHER): Payer: Self-pay | Admitting: Ophthalmology

## 2019-04-15 ENCOUNTER — Ambulatory Visit (INDEPENDENT_AMBULATORY_CARE_PROVIDER_SITE_OTHER): Payer: Medicare HMO | Admitting: Ophthalmology

## 2019-04-15 DIAGNOSIS — H43823 Vitreomacular adhesion, bilateral: Secondary | ICD-10-CM

## 2019-04-15 DIAGNOSIS — H3581 Retinal edema: Secondary | ICD-10-CM

## 2019-04-15 DIAGNOSIS — H43393 Other vitreous opacities, bilateral: Secondary | ICD-10-CM

## 2019-04-15 DIAGNOSIS — H353211 Exudative age-related macular degeneration, right eye, with active choroidal neovascularization: Secondary | ICD-10-CM

## 2019-04-15 DIAGNOSIS — H43821 Vitreomacular adhesion, right eye: Secondary | ICD-10-CM

## 2019-04-15 DIAGNOSIS — H353121 Nonexudative age-related macular degeneration, left eye, early dry stage: Secondary | ICD-10-CM

## 2019-04-15 DIAGNOSIS — Z961 Presence of intraocular lens: Secondary | ICD-10-CM

## 2019-04-15 MED ORDER — PREDNISOLONE ACETATE 1 % OP SUSP: 1.0000 [drp] | Freq: Every day | OPHTHALMIC | 0 refills | Status: DC

## 2019-04-15 NOTE — Progress Notes (Deleted)
PF 6Xdaily Brimonidine 1X daily Atropine 2Xdaily until bottle runs out zymaxid 4Xdaily until bottle runs out Polysporin ointment 4Xdaily  Face down 50 % of the time

## 2019-05-04 NOTE — Progress Notes (Signed)
[facility] Note  05/05/2019     CHIEF COMPLAINT Patient presents for Post-op Follow-up   HISTORY OF PRESENT ILLNESS: Lindsay Harvey is a 83 y.o. female who presents to the clinic today for:   HPI    Post-op Follow-up    In right eye.  Discomfort includes foreign body sensation.  Vision is stable.  I, the attending physician,  performed the HPI with the patient and updated documentation appropriately.          Comments    S/p PPV w/ MP gas OD Patient states her vision is still blurry OD and states it is difficult to tell whether or not vision is improving.  She does not have any pain but occasionally feels like she has something in OU.  Patient denies any new or worsening floaters or fol OU.       Last edited by [name], MD on 05/05/2019  8:02 AM. (History)    Has not run out of atropine or gatifloxacin -- has been taking both since last post op appt.  Referring physician: Jani Gravel, MD [address] [place],  [place] [address]  HISTORICAL INFORMATION:   Selected notes from the MEDICAL RECORD NUMBER Referred by Dr. [name] for concern of AMD OU, CNV;  LEE- 11.20.18 (C. [name]) [BCVA OD: 20/40 OS 20/30] Ocular Hx- DES OU, pseudophakia OU, keratoconjunctivitis sicca OU PMH- high chol.    CURRENT MEDICATIONS: Current Outpatient Medications (Ophthalmic Drugs)  Medication Sig  . carboxymethylcellulose (REFRESH PLUS) 0.5 % SOLN Place 1 drop into both eyes 3 (three) times daily as needed (dry/irritated eyes).   . prednisoLONE acetate (PRED FORTE) 1 % ophthalmic suspension Place 1 drop into the right eye 4 (four) times daily.  Marland Kitchen REFRESH OPTIVE MEGA-3 0.5-1-0.5 % SOLN Place 1 drop into both eyes 3 (three) times daily as needed (discomfort/eye irritation.).   No current facility-administered medications for this visit.  (Ophthalmic Drugs)   Current Outpatient Medications (Other)  Medication Sig  . calcium carbonate (OSCAL) 1500  (600 Ca) MG TABS tablet Take 600 mg of elemental calcium by mouth 2 (two) times a day.  . Cholecalciferol (VITAMIN D3) 50 MCG (2000 UT) TABS Take 2,000 Units by mouth daily.  . Multiple Vitamins-Minerals (EMERGEN-C IMMUNE PO) Take 1 packet by mouth daily as needed (for immune support).  . Multiple Vitamins-Minerals (PRESERVISION AREDS 2 PO) Take 1 tablet by mouth 2 (two) times a day.   . simvastatin (ZOCOR) 40 MG tablet Take 10 mg by mouth every evening. 1/4 tablet at night   Current Facility-Administered Medications (Other)  Medication Route  . Bevacizumab (AVASTIN) SOLN 1.25 mg Intravitreal  . Bevacizumab (AVASTIN) SOLN 1.25 mg Intravitreal  . Bevacizumab (AVASTIN) SOLN 1.25 mg Intravitreal  . Bevacizumab (AVASTIN) SOLN 1.25 mg Intravitreal  . Bevacizumab (AVASTIN) SOLN 1.25 mg Intravitreal  . Bevacizumab (AVASTIN) SOLN 1.25 mg Intravitreal  . Bevacizumab (AVASTIN) SOLN 1.25 mg Intravitreal  . Bevacizumab (AVASTIN) SOLN 1.25 mg Intravitreal  . Bevacizumab (AVASTIN) SOLN 1.25 mg Intravitreal  . Bevacizumab (AVASTIN) SOLN 1.25 mg Intravitreal  . Bevacizumab (AVASTIN) SOLN 1.25 mg Intravitreal  . Bevacizumab (AVASTIN) SOLN 1.25 mg Intravitreal  . Bevacizumab (AVASTIN) SOLN 1.25 mg Intravitreal      REVIEW OF SYSTEMS: ROS    Positive for: Genitourinary, Eyes   Negative for: Constitutional, Gastrointestinal, Neurological, Skin, Musculoskeletal, HENT, Endocrine, Cardiovascular, Respiratory, Psychiatric, Allergic/Imm, Heme/Lymph   Last edited by Doneen Poisson on 05/05/2019  7:47 AM. (History)       ALLERGIES No Known Allergies  PAST MEDICAL HISTORY Past Medical History:  Diagnosis Date  . Allergies   . Cataract 2013   OU  . 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 Headache   . Hypercholesterolemia   . Macular degeneration    Wet OD, Dry OS  . Wears glasses    Past Surgical History:  Procedure Laterality Date  . 25 GAUGE PARS PLANA  VITRECTOMY WITH 20 GAUGE MVR PORT FOR MACULAR HOLE Right 04/07/2019   Procedure: 25 GAUGE PARS PLANA VITRECTOMY WITH 20 GAUGE MVR PORT FOR MACULAR HOLE;  Surgeon: Rennis ChrisZamora, Ziara Thelander, MD;  [facility];  Service: Ophthalmology;  Laterality: Right;  . CATARACT EXTRACTION     2013  . DILATION AND CURETTAGE OF UTERUS    . EYE SURGERY    . GAS/FLUID EXCHANGE Right 04/07/2019   Procedure: Gas/Fluid Exchange;  Surgeon: Rennis ChrisZamora, Tannar Broker, MD;  [facility];  Service: Ophthalmology;  Laterality: Right;  . MEMBRANE PEEL Right 04/07/2019   Procedure: [place] FlaxMembrane Peel;  Surgeon: Rennis ChrisZamora, Charnette Younkin, MD;  [facility];  Service: Ophthalmology;  Laterality: Right;  . PHOTOCOAGULATION WITH LASER Right 04/07/2019   Procedure: Photocoagulation With Laser;  Surgeon: Rennis ChrisZamora, Drury Ardizzone, MD;  [facility];  Service: Ophthalmology;  Laterality: Right;    FAMILY HISTORY Family History  Problem Relation Age of Onset  . Stroke Father     SOCIAL HISTORY Social History   Tobacco Use  . Smoking status: Never Smoker  . Smokeless tobacco: Never Used  Substance Use Topics  . Alcohol use: No    Frequency: Never  . Drug use: No         OPHTHALMIC EXAM:  Base Eye Exam    Visual Acuity (Snellen - Linear)      Right Left   Dist [place] 20/150 -1 20/50 +2   Dist ph [place] 20/100 +2 20/30 -2   Correction: Glasses       Tonometry (Tonopen, 7:49 AM)      Right Left   Pressure 19 21       Pupils      Dark Light Shape React APD   Right 5 4 Round Minimal 0   Left 2 1 Round Minimal 0       Visual Fields      Left Right    Full Full       Extraocular Movement      Right Left    Full Full       Neuro/Psych    Oriented x3: Yes   Mood/Affect: Normal       Dilation    Both eyes: 1.0% Mydriacyl, 2.5% Phenylephrine @ 7:49 AM        Slit Lamp and Fundus Exam    Slit Lamp Exam      Right Left   Lids/Lashes Dermatochalasis - upper lid, periorbital edema Dermatochalasis - upper lid   Conjunctiva/Sclera  Subconjunctival hemorrhage superotemporal quad-improving, sutures intact White and quiet   Cornea Arcus, 1+ Punctate epithelial erosions, tr Descemet's folds, trace endopigment Arcus, Inferior 1+ Punctate epithelial erosions   Anterior Chamber 1/2+cell/pigment Deep and quiet   Iris Round and dilated Round and dilated   Lens PC IOL in good postion with anterior capsular phimosis, 1+ Posterior capsular opacification PC IOL in good postion with anterior capsular phimosis, 1+ Posterior capsular opacification sparing center   Vitreous post vitrectomy, gas  bubble ~40 % Vitreous syneresis       Fundus Exam      Right Left   Disc Pink and Sharp, peripapillary drusen Normal   C/D Ratio 0.4 0.6   Macula flat; ERM/VMT gone, +drusen    Vessels Vascular attenuation    Periphery Attached, scattered peripheral drusen, peripapillary drusen, laser changes superiorly         Refraction    Wearing Rx   Did not bring glasses today          IMAGING AND PROCEDURES  Imaging and Procedures for 02/09/18  OCT, Retina - OU - Both Eyes       Right Eye Quality was good. Central Foveal Thickness: 253. Progression has improved. Findings include retinal drusen , intraretinal fluid, outer retinal atrophy, normal foveal contour, intraretinal hyper-reflective material (VMT resolved; nonexudative ARMD w/ central atrophy).   Left Eye Quality was good. Central Foveal Thickness: 285. Progression has been stable. Findings include vitreous traction, pigment epithelial detachment, abnormal foveal contour, no SRF, retinal drusen , epiretinal membrane, outer retinal atrophy, intraretinal fluid (stable cystic changes below VMT).   Notes Images taken, stored on drive  Diagnosis / Impression:  OD: VMT resolved; nonexudative ARMD w/ central ORA OS: Nonexudative ARMD; VMT w/ cystic changes stable  Clinical management:  See below  Abbreviations: NFP - Normal foveal profile. CME - cystoid macular edema. PED - pigment  epithelial detachment. IRF - intraretinal fluid. SRF - subretinal fluid. EZ - ellipsoid zone. ERM - epiretinal membrane. ORA - outer retinal atrophy. ORT - outer retinal tubulation. SRHM - subretinal hyper-reflective material                  ASSESSMENT/PLAN:    ICD-10-CM   1. Exudative age-related macular degeneration of right eye with active choroidal neovascularization (HCC)  H35.3211   2. 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 Retinal edema  H35.81 OCT, Retina - OU - Both Eyes  5. Pseudophakia of both eyes  Z96.1   6. 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 Vitreomacular adhesion of right eye  H43.821     1. 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    - S/P IVA OD #1 (12.03.18), #2 (01.02.19), #3 (01.30.19), #4 (03.01.19), #5 (04.01.19), #6 (04.30.19), #7 (06.05.19), #8 (07.19.19), #9 (08.30.19), #10 (10.14.19), #11 (11.18.19), #12 (12.30.19), #13 (02.10.20), #14 (04.06.20)  - has had good response to IVA  - intraretinal heme improved on exam  - FA (04.01.19) shows mild CNVM OD consistent with exudative ARMD  2. Age related macular degeneration, non-exudative, OS  - The incidence, anatomy, and pathology of dry AMD, risk of progression, and the AREDS and AREDS 2 study including smoking risks discussed with patient.  - Continue amsler grid monitoring  - will continue to monitor for conversion to exudative ARMD  3,4. 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)   - Pre-op: VMT continues to worsen OD -- macular cyst increasing in volume and height  - Pre-op BCVA OD 20/80 from 20/70 from 20/60 -- progressive decline  - OS stable  - now POW4 s/p PPV/ICG/MP/14% C3F8 OD, 06.25.2020             - doing well             - retina attached w/ VMT relieved and gas bubble shrinking             -  IOP 19  - gas bubble at ~40%  - stop   gatifloxacin and atropine             - dec PF to QID OD                         PSO ung qhs + PRN OD   - cont Brimonidine qdaily OD  - avoid laying flat on back              - post op drop and positioning instructions reviewed              - tylenol/ibuprofen for pain  - f/u 4 weeks  5. Pseudophakia OU  - s/p CE/IOL OU  - s/p YAG OU  - beautiful surgeries, doing well  - monitor  6. Vitreous syneresis OS  - Pt with intermittent floaters OS  - No frank PVD on exam  - Discussed findings and prognosis  - No RT or RD on 360 peripheral exam  - Reviewed s/s of RT/RD  - Strict return precautions for any such RT/RD signs/symptoms   Ophthalmic Meds Ordered this visit:  Meds ordered this encounter  Medications  . prednisoLONE acetate (PRED FORTE) 1 % ophthalmic suspension    Sig: Place 1 drop into the right eye 4 (four) times daily.    Dispense:  15 mL    Refill:  0       Return in about 4 weeks (around 06/02/2019) for POV.  There are no Patient Instructions on file for this visit.   Explained the diagnoses, plan, and follow up with the patient and they expressed understanding.  Patient expressed understanding of the importance of proper follow up care.   This document serves as a record of services personally performed by Karie ChimeraBrian G. Tavaris Eudy, MD, PhD. It was created on their behalf by Laurian BrimAmanda Brown, OA, an ophthalmic assistant. The creation of this record is the provider's dictation and/or activities during the visit.    Electronically signed by: Laurian BrimAmanda Brown, OA  07.22.2020 12:43 AM   Karie ChimeraBrian G. Manaia Samad, M.D., Ph.D. Diseases & Surgery of the Retina and Vitreous [facility]  I have reviewed the above documentation for accuracy and completeness, and I agree with the above. Karie ChimeraBrian G. Ayleah Hofmeister, M.D., Ph.D. 05/06/19 12:45 AM     Abbreviations: M myopia (nearsighted); A astigmatism; H hyperopia (farsighted); P presbyopia; Mrx spectacle prescription;  CTL contact  lenses; OD right eye; OS left eye; OU both eyes  XT exotropia; ET esotropia; PEK punctate epithelial keratitis; PEE punctate epithelial erosions; DES dry eye syndrome; MGD meibomian gland dysfunction; ATs artificial tears; PFAT's preservative free artificial tears; NSC nuclear sclerotic cataract; PSC posterior subcapsular cataract; ERM epi-retinal membrane; PVD posterior vitreous detachment; RD retinal detachment; DM diabetes mellitus; DR diabetic retinopathy; NPDR non-proliferative diabetic retinopathy; PDR proliferative diabetic retinopathy; CSME clinically significant macular edema; DME diabetic macular edema; dbh dot blot hemorrhages; CWS cotton wool spot; POAG primary open angle glaucoma; C/D cup-to-disc ratio; HVF humphrey visual field; GVF goldmann visual field; OCT optical coherence tomography; IOP intraocular pressure; BRVO Branch retinal vein occlusion; CRVO central retinal vein occlusion; CRAO central retinal artery occlusion; BRAO branch retinal artery occlusion; RT retinal tear; SB scleral buckle; PPV pars plana vitrectomy; VH Vitreous hemorrhage; PRP panretinal laser photocoagulation; IVK intravitreal kenalog; VMT vitreomacular traction; MH Macular hole;  NVD neovascularization of the disc; NVE neovascularization elsewhere; AREDS age related eye disease study; ARMD  age related macular degeneration; POAG primary open angle glaucoma; EBMD epithelial/anterior basement membrane dystrophy; ACIOL anterior chamber intraocular lens; IOL intraocular lens; PCIOL posterior chamber intraocular lens; Phaco/IOL phacoemulsification with intraocular lens placement; [place] photorefractive keratectomy; LASIK laser assisted in situ keratomileusis; HTN hypertension; DM diabetes mellitus; COPD chronic obstructive pulmonary disease

## 2019-05-05 ENCOUNTER — Encounter (INDEPENDENT_AMBULATORY_CARE_PROVIDER_SITE_OTHER): Payer: Self-pay | Admitting: Ophthalmology

## 2019-05-05 ENCOUNTER — Other Ambulatory Visit: Payer: Self-pay

## 2019-05-05 ENCOUNTER — Ambulatory Visit (INDEPENDENT_AMBULATORY_CARE_PROVIDER_SITE_OTHER): Payer: Medicare HMO | Admitting: Ophthalmology

## 2019-05-05 DIAGNOSIS — H3581 Retinal edema: Secondary | ICD-10-CM | POA: Diagnosis not present

## 2019-05-05 DIAGNOSIS — H43823 Vitreomacular adhesion, bilateral: Secondary | ICD-10-CM

## 2019-05-05 DIAGNOSIS — H353211 Exudative age-related macular degeneration, right eye, with active choroidal neovascularization: Secondary | ICD-10-CM

## 2019-05-05 DIAGNOSIS — H43393 Other vitreous opacities, bilateral: Secondary | ICD-10-CM

## 2019-05-05 DIAGNOSIS — Z961 Presence of intraocular lens: Secondary | ICD-10-CM

## 2019-05-05 DIAGNOSIS — H353121 Nonexudative age-related macular degeneration, left eye, early dry stage: Secondary | ICD-10-CM

## 2019-05-05 MED ORDER — PREDNISOLONE ACETATE 1 % OP SUSP: 1.0000 [drp] | Freq: Four times a day (QID) | OPHTHALMIC | 0 refills | Status: AC

## 2019-06-01 NOTE — Progress Notes (Signed)
[facility] - Clinic Note  06/02/2019     CHIEF COMPLAINT Patient presents for Post-op Follow-up   HISTORY OF PRESENT ILLNESS: Lindsay Harvey is a [age] y.o. female who presents to the clinic today for:   HPI    Post-op Follow-up    In right eye.  Discomfort includes none.  Vision is improved.  I, the attending physician,  performed the HPI with the patient and updated documentation appropriately.          Comments    83 y/o female pt here for 4 wk f/u.  S/p PPV + MP OD 06.25.20.  VA OD slightly improved.  No change in [place] OS.  Denies pain, flashes, floaters.  Brimonidine QD OD Pred QID OD PSO ung QHS OD       Last edited by Lindsay ChrisZamora, Tyronica Truxillo, MD on 06/02/2019  8:28 PM. (History)      Referring physician: Pearson GrippeKim, James, MD [address] [place] [id number]  HISTORICAL INFORMATION:   Selected notes from the MEDICAL RECORD NUMBER Referred by Dr. Marcha Harvey. Harvey for concern of AMD OU, CNV;  LEE- 11.20.18 (Lindsay Harvey) [BCVA OD: 20/40 OS 20/30] Ocular Hx- DES OU, pseudophakia OU, keratoconjunctivitis sicca OU PMH- high chol.    CURRENT MEDICATIONS: Current Outpatient Medications (Ophthalmic Drugs)  Medication Sig  . carboxymethylcellulose (REFRESH PLUS) 0.5 % SOLN Place 1 drop into both eyes 3 (three) times daily as needed (dry/irritated eyes).   . prednisoLONE acetate (PRED FORTE) 1 % ophthalmic suspension Place 1 drop into the right eye 4 (four) times daily.  Marland Kitchen. REFRESH OPTIVE MEGA-3 0.5-1-0.5 % SOLN Place 1 drop into both eyes 3 (three) times daily as needed (discomfort/eye irritation.).   No current facility-administered medications for this visit.  (Ophthalmic Drugs)   Current Outpatient Medications (Other)  Medication Sig  . calcium carbonate (OSCAL) 1500 (600 Ca) MG TABS tablet Take 600 mg of elemental calcium by mouth 2 (two) times a day.  . Cholecalciferol (VITAMIN D3) 50 MCG (2000 UT) TABS Take 2,000 Units by mouth daily.  . Multiple  Vitamins-Minerals (EMERGEN-C IMMUNE PO) Take 1 packet by mouth daily as needed (for immune support).  . Multiple Vitamins-Minerals (PRESERVISION AREDS 2 PO) Take 1 tablet by mouth 2 (two) times a day.   . simvastatin (ZOCOR) 40 MG tablet Take 10 mg by mouth every evening. 1/4 tablet at night   Current Facility-Administered Medications (Other)  Medication Route  . Bevacizumab (AVASTIN) SOLN 1.25 mg Intravitreal  . Bevacizumab (AVASTIN) SOLN 1.25 mg Intravitreal  . Bevacizumab (AVASTIN) SOLN 1.25 mg Intravitreal  . Bevacizumab (AVASTIN) SOLN 1.25 mg Intravitreal  . Bevacizumab (AVASTIN) SOLN 1.25 mg Intravitreal  . Bevacizumab (AVASTIN) SOLN 1.25 mg Intravitreal  . Bevacizumab (AVASTIN) SOLN 1.25 mg Intravitreal  . Bevacizumab (AVASTIN) SOLN 1.25 mg Intravitreal  . Bevacizumab (AVASTIN) SOLN 1.25 mg Intravitreal  . Bevacizumab (AVASTIN) SOLN 1.25 mg Intravitreal  . Bevacizumab (AVASTIN) SOLN 1.25 mg Intravitreal  . Bevacizumab (AVASTIN) SOLN 1.25 mg Intravitreal  . Bevacizumab (AVASTIN) SOLN 1.25 mg Intravitreal      REVIEW OF SYSTEMS: ROS    Positive for: Gastrointestinal, Eyes   Negative for: Constitutional, Neurological, Skin, Genitourinary, Musculoskeletal, HENT, Endocrine, Cardiovascular, Respiratory, Psychiatric, Allergic/Imm, Heme/Lymph   Last edited by Lindsay Harvey, Lindsay Harvey, COA on 06/02/2019  7:52 AM. (History)       ALLERGIES No Known Allergies  PAST MEDICAL HISTORY Past Medical History:  Diagnosis Date  . Allergies   .  Cataract 2013   OU  . 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 Headache   . Hypercholesterolemia   . Macular degeneration    Wet OD, Dry OS  . Wears glasses    Past Surgical History:  Procedure Laterality Date  . 25 GAUGE PARS PLANA VITRECTOMY WITH 20 GAUGE MVR PORT FOR MACULAR HOLE Right 04/07/2019   Procedure: 25 GAUGE PARS PLANA VITRECTOMY WITH 20 GAUGE MVR PORT FOR MACULAR HOLE;  Surgeon: Lindsay ChrisZamora, Bodin Gorka, MD;   [facility];  Service: Ophthalmology;  Laterality: Right;  . CATARACT EXTRACTION     2013  . DILATION AND CURETTAGE OF UTERUS    . EYE SURGERY    . GAS/FLUID EXCHANGE Right 04/07/2019   Procedure: Gas/Fluid Exchange;  Surgeon: Lindsay ChrisZamora, Alvy Alsop, MD;  [facility];  Service: Ophthalmology;  Laterality: Right;  . MEMBRANE Harvey Right 04/07/2019   Procedure: Lindsay Harvey;  Surgeon: Lindsay ChrisZamora, Xitlalic Maslin, MD;  [facility];  Service: Ophthalmology;  Laterality: Right;  . PHOTOCOAGULATION WITH LASER Right 04/07/2019   Procedure: Photocoagulation With Laser;  Surgeon: Lindsay ChrisZamora, Marybell Robards, MD;  [facility];  Service: Ophthalmology;  Laterality: Right;    FAMILY HISTORY Family History  Problem Relation Age of Onset  . Stroke Father     SOCIAL HISTORY Social History   Tobacco Use  . Smoking status: Never Smoker  . Smokeless tobacco: Never Used  Substance Use Topics  . Alcohol use: No    Frequency: Never  . Drug use: No         OPHTHALMIC EXAM:  Base Eye Exam    Visual Acuity (Snellen - Linear)      Right Left   Dist [place] 20/100 -2 20/40 -2   Dist ph  20/70 -2 20/30 -2       Tonometry (Tonopen, 7:53 AM)      Right Left   Pressure 14 12       Pupils      Dark Light Shape React APD   Right 4 3 Round Minimal None   Left 3 2 Round Minimal None       Visual Fields (Counting fingers)      Left Right    Full Full       Extraocular Movement      Right Left    Full, Ortho Full, Ortho       Neuro/Psych    Oriented x3: Yes   Mood/Affect: Normal       Dilation    Both eyes: 1.0% Mydriacyl, 2.5% Phenylephrine @ 7:53 AM        Slit Lamp and Fundus Exam    Slit Lamp Exam      Right Left   Lids/Lashes Dermatochalasis - upper lid, periorbital edema Dermatochalasis - upper lid   Conjunctiva/Sclera Subconjunctival hemorrhage superotemporal quad-improving, sutures intact White and quiet   Cornea Arcus, trace Punctate epithelial erosions Arcus, Inferior trace Punctate  epithelial erosions, fine endo pigment   Anterior Chamber Deep, 0.5+pigment Deep and quiet   Iris Round and dilated Round and dilated   Lens PC IOL in good postion with anterior capsular phimosis, 1+ Posterior capsular opacification PC IOL in good postion with anterior capsular phimosis, 1+ Posterior capsular opacification sparing center   Vitreous post vitrectomy, mild pigment, gas bubble ~2-3% Vitreous syneresis       Fundus Exam      Right Left   Disc Sharp rim, mild pallor, peripapillary drusen  Pink and Sharp, +cupping   C/D Ratio 0.4 0.6   Macula flat; ERM/VMT gone, +drusen, no heme Blunted foveal reflex, Flat, Drusen, early Atrophy, RPE mottling, clumping, and atrophy +PEDs, No heme   Vessels Vascular attenuation Vascular attenuation   Periphery Attached, scattered peripheral drusen, peripapillary drusen, laser changes superiorly Attached with peripheral drusen          IMAGING AND PROCEDURES  Imaging and Procedures for 02/09/18  OCT, Retina - OU - Both Eyes       Right Eye Quality was good. Central Foveal Thickness: 249. Progression has been stable. Findings include retinal drusen , intraretinal fluid, outer retinal atrophy, intraretinal hyper-reflective material, abnormal foveal contour, pigment epithelial detachment (Trace cystic changes temporal fovea; mild IRF vs retinoschisis superiorly caught on widefield).   Left Eye Quality was good. Central Foveal Thickness: 287. Progression has been stable. Findings include vitreous traction, pigment epithelial detachment, abnormal foveal contour, no SRF, retinal drusen , epiretinal membrane, outer retinal atrophy, intraretinal fluid (stable cystic changes below VMT).   Notes Images taken, stored on drive  Diagnosis / Impression:  OD: VMT resolved; nonexudative ARMD w/ central ORA; Trace cystic changes temporal fovea; mild IRF vs retinoschisis superiorly caught on widefield OS: Nonexudative ARMD; VMT w/ cystic changes  stable  Clinical management:  See below  Abbreviations: NFP - Normal foveal profile. CME - cystoid macular edema. PED - pigment epithelial detachment. IRF - intraretinal fluid. SRF - subretinal fluid. EZ - ellipsoid zone. ERM - epiretinal membrane. ORA - outer retinal atrophy. ORT - outer retinal tubulation. SRHM - subretinal hyper-reflective material                  ASSESSMENT/PLAN:    ICD-10-CM   1. Exudative age-related macular degeneration of right eye with active choroidal neovascularization (HCC)  H35.3211   2. 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 Retinal edema  H35.81 OCT, Retina - OU - Both Eyes  5. Pseudophakia of both eyes  Z96.1   6. Vitreous syneresis of left eye  H43.392     1. 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    - S/P IVA OD #1 (12.03.18), #2 (01.02.19), #3 (01.30.19), #4 (03.01.19), #5 (04.01.19), #6 (04.30.19), #7 (06.05.19), #8 (07.19.19), #9 (08.30.19), #10 (10.14.19), #11 (11.18.19), #12 (12.30.19), #13 (02.10.20), #14 (04.06.20)  - has had good response to IVA  - intraretinal heme improved on exam  - FA (04.01.19) shows mild CNVM OD consistent with exudative ARMD  2. Age related macular degeneration, non-exudative, OS  - The incidence, anatomy, and pathology of dry AMD, risk of progression, and the AREDS and AREDS 2 study including smoking risks discussed with patient.  - Continue amsler grid monitoring  - will continue to monitor for conversion to exudative ARMD  3,4. 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)   - Pre-op: VMT continues to worsen OD -- macular cyst increasing in volume and height  - Pre-op BCVA OD 20/80 from 20/70 from 20/60 -- progressive decline  - OS stable  - now POW8 s/p PPV/ICG/MP/14% C3F8 OD, 06.25.2020             - doing well             - retina attached w/ VMT relieved and gas  bubble almost gone  - BCVA 20/70 -- limited somewhat by AMD             -  IOP 14  - gas bubble at ~2-3%             - start PF taper -- 4,3,2,1 drops daily, decrease weekly  - PSO ung PRN OD   - okay to stop Brimonidine  - avoid laying flat on back              - post op drop and positioning instructions reviewed              - tylenol/ibuprofen for pain  - f/u 4 weeks  5. Pseudophakia OU  - s/p CE/IOL OU  - s/p YAG OU  - beautiful surgeries, doing well  - monitor  6. Vitreous syneresis OS  - Pt with intermittent floaters OS  - No frank PVD on exam  - Discussed findings and prognosis  - No RT or RD on 360 peripheral exam  - Reviewed s/s of RT/RD  - Strict return precautions for any such RT/RD signs/symptoms   Ophthalmic Meds Ordered this visit:  No orders of the defined types were placed in this encounter.      Return in about 4 weeks (around 06/30/2019) for POV, VMT OD, DFE, OCT.  There are no Patient Instructions on file for this visit.   Explained the diagnoses, plan, and follow up with the patient and they expressed understanding.  Patient expressed understanding of the importance of proper follow up care.   This document serves as a record of services personally performed by Gardiner Sleeper, MD, PhD. It was created on their behalf by Ernest Mallick, OA, an ophthalmic assistant. The creation of this record is the provider's dictation and/or activities during the visit.    Electronically signed by: Ernest Mallick, OA  08.19.2020 8:31 PM    Gardiner Sleeper, M.D., Ph.D. Diseases & Surgery of the Retina and Vitreous [facility]  I have reviewed the above documentation for accuracy and completeness, and I agree with the above. Gardiner Sleeper, M.D., Ph.D. 06/02/19 8:31 PM   Abbreviations: M myopia (nearsighted); A astigmatism; H hyperopia (farsighted); P presbyopia; Mrx spectacle prescription;  CTL contact lenses; OD right eye; OS left eye; OU  both eyes  XT exotropia; ET esotropia; PEK punctate epithelial keratitis; PEE punctate epithelial erosions; DES dry eye syndrome; MGD meibomian gland dysfunction; ATs artificial tears; PFAT's preservative free artificial tears; [place] nuclear sclerotic cataract; PSC posterior subcapsular cataract; ERM epi-retinal membrane; PVD posterior vitreous detachment; RD retinal detachment; DM diabetes mellitus; DR diabetic retinopathy; NPDR non-proliferative diabetic retinopathy; PDR proliferative diabetic retinopathy; CSME clinically significant macular edema; DME diabetic macular edema; dbh dot blot hemorrhages; CWS cotton wool spot; POAG primary open angle glaucoma; C/D cup-to-disc ratio; HVF humphrey visual field; GVF goldmann visual field; OCT optical coherence tomography; IOP intraocular pressure; BRVO Branch retinal vein occlusion; CRVO central retinal vein occlusion; CRAO central retinal artery occlusion; BRAO branch retinal artery occlusion; RT retinal tear; SB scleral buckle; PPV pars plana vitrectomy; VH Vitreous hemorrhage; PRP panretinal laser photocoagulation; IVK intravitreal kenalog; VMT vitreomacular traction; MH Macular hole;  NVD neovascularization of the disc; NVE neovascularization elsewhere; AREDS age related eye disease study; ARMD age related macular degeneration; POAG primary open angle glaucoma; EBMD epithelial/anterior basement membrane dystrophy; ACIOL anterior chamber intraocular lens; IOL intraocular lens; PCIOL posterior chamber intraocular lens; Phaco/IOL phacoemulsification with intraocular lens placement; [place] photorefractive keratectomy; LASIK laser assisted in situ keratomileusis; HTN hypertension; DM diabetes mellitus; COPD chronic obstructive pulmonary disease

## 2019-06-02 ENCOUNTER — Other Ambulatory Visit: Payer: Self-pay

## 2019-06-02 ENCOUNTER — Ambulatory Visit (INDEPENDENT_AMBULATORY_CARE_PROVIDER_SITE_OTHER): Payer: Medicare HMO | Admitting: Ophthalmology

## 2019-06-02 ENCOUNTER — Encounter (INDEPENDENT_AMBULATORY_CARE_PROVIDER_SITE_OTHER): Payer: Self-pay | Admitting: Ophthalmology

## 2019-06-02 ENCOUNTER — Telehealth (INDEPENDENT_AMBULATORY_CARE_PROVIDER_SITE_OTHER): Payer: Self-pay

## 2019-06-02 DIAGNOSIS — H43392 Other vitreous opacities, left eye: Secondary | ICD-10-CM

## 2019-06-02 DIAGNOSIS — H3581 Retinal edema: Secondary | ICD-10-CM

## 2019-06-02 DIAGNOSIS — H353211 Exudative age-related macular degeneration, right eye, with active choroidal neovascularization: Secondary | ICD-10-CM

## 2019-06-02 DIAGNOSIS — Z961 Presence of intraocular lens: Secondary | ICD-10-CM

## 2019-06-02 DIAGNOSIS — H353121 Nonexudative age-related macular degeneration, left eye, early dry stage: Secondary | ICD-10-CM

## 2019-06-02 DIAGNOSIS — H43823 Vitreomacular adhesion, bilateral: Secondary | ICD-10-CM

## 2019-06-29 NOTE — Progress Notes (Addendum)
[facility] Note  06/30/2019     CHIEF COMPLAINT Patient presents for Post-op Follow-up   HISTORY OF PRESENT ILLNESS: Lindsay Harvey is a 83 y.o. female who presents to the clinic today for:   HPI    Post-op Follow-up    In right eye.  Vision is improved.  I, the attending physician,  performed the HPI with the patient and updated documentation appropriately.          Comments    Patient states her vision is improving OD.  She complains of occasional sharp pain and foreign body sensation OD.  Patient denies any new or worsening floaters or fol.       Last edited by [name], MD on 06/30/2019  7:48 AM. (History)    pt states her gas bubble has been gone since August 24, pt completed PF taper as directed  Referring physician: Hortencia Pilar, MD [place],  [place] [address]  HISTORICAL INFORMATION:   Selected notes from the MEDICAL RECORD NUMBER Referred by Dr. [name] for concern of AMD OU, CNV;  LEE- 11.20.18 (C. [name]) [BCVA OD: 20/40 OS 20/30] Ocular Hx- DES OU, pseudophakia OU, keratoconjunctivitis sicca OU PMH- high chol.    CURRENT MEDICATIONS: Current Outpatient Medications (Ophthalmic Drugs)  Medication Sig  . carboxymethylcellulose (REFRESH PLUS) 0.5 % SOLN Place 1 drop into both eyes 3 (three) times daily as needed (dry/irritated eyes).   . prednisoLONE acetate (PRED FORTE) 1 % ophthalmic suspension Place 1 drop into the right eye 4 (four) times daily.  Marland Kitchen REFRESH OPTIVE MEGA-3 0.5-1-0.5 % SOLN Place 1 drop into both eyes 3 (three) times daily as needed (discomfort/eye irritation.).   No current facility-administered medications for this visit.  (Ophthalmic Drugs)   Current Outpatient Medications (Other)  Medication Sig  . calcium carbonate (OSCAL) 1500 (600 Ca) MG TABS tablet Take 600 mg of elemental calcium by mouth 2 (two) times a day.  . Cholecalciferol (VITAMIN D3) 50 MCG (2000 UT) TABS Take  2,000 Units by mouth daily.  . Multiple Vitamins-Minerals (EMERGEN-C IMMUNE PO) Take 1 packet by mouth daily as needed (for immune support).  . Multiple Vitamins-Minerals (PRESERVISION AREDS 2 PO) Take 1 tablet by mouth 2 (two) times a day.   . simvastatin (ZOCOR) 40 MG tablet Take 10 mg by mouth every evening. 1/4 tablet at night   Current Facility-Administered Medications (Other)  Medication Route  . Bevacizumab (AVASTIN) SOLN 1.25 mg Intravitreal  . Bevacizumab (AVASTIN) SOLN 1.25 mg Intravitreal  . Bevacizumab (AVASTIN) SOLN 1.25 mg Intravitreal  . Bevacizumab (AVASTIN) SOLN 1.25 mg Intravitreal  . Bevacizumab (AVASTIN) SOLN 1.25 mg Intravitreal  . Bevacizumab (AVASTIN) SOLN 1.25 mg Intravitreal  . Bevacizumab (AVASTIN) SOLN 1.25 mg Intravitreal  . Bevacizumab (AVASTIN) SOLN 1.25 mg Intravitreal  . Bevacizumab (AVASTIN) SOLN 1.25 mg Intravitreal  . Bevacizumab (AVASTIN) SOLN 1.25 mg Intravitreal  . Bevacizumab (AVASTIN) SOLN 1.25 mg Intravitreal  . Bevacizumab (AVASTIN) SOLN 1.25 mg Intravitreal  . Bevacizumab (AVASTIN) SOLN 1.25 mg Intravitreal      REVIEW OF SYSTEMS: ROS    Positive for: Gastrointestinal, Eyes   Negative for: Constitutional, Neurological, Skin, Genitourinary, Musculoskeletal, HENT, Endocrine, Cardiovascular, Respiratory, Psychiatric, Allergic/Imm, Heme/Lymph   Last edited by Doneen Poisson on 06/30/2019  7:38 AM. (History)       ALLERGIES No Known Allergies  PAST MEDICAL HISTORY Past Medical History:  Diagnosis Date  . Allergies   . Cataract  2013   OU  . 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 Headache   . Hypercholesterolemia   . Macular degeneration    Wet OD, Dry OS  . Wears glasses    Past Surgical History:  Procedure Laterality Date  . 25 GAUGE PARS PLANA VITRECTOMY WITH 20 GAUGE MVR PORT FOR MACULAR HOLE Right 04/07/2019   Procedure: 25 GAUGE PARS PLANA VITRECTOMY WITH 20 GAUGE MVR PORT FOR MACULAR HOLE;   Surgeon: Rennis ChrisZamora, Waller Marcussen, MD;  [facility];  Service: Ophthalmology;  Laterality: Right;  . CATARACT EXTRACTION     2013  . DILATION AND CURETTAGE OF UTERUS    . EYE SURGERY    . GAS/FLUID EXCHANGE Right 04/07/2019   Procedure: Gas/Fluid Exchange;  Surgeon: Rennis ChrisZamora, Nael Petrosyan, MD;  [facility];  Service: Ophthalmology;  Laterality: Right;  . MEMBRANE PEEL Right 04/07/2019   Procedure: Eula FlaxMembrane Peel;  Surgeon: Rennis ChrisZamora, Nataleigh Griffin, MD;  [facility];  Service: Ophthalmology;  Laterality: Right;  . PHOTOCOAGULATION WITH LASER Right 04/07/2019   Procedure: Photocoagulation With Laser;  Surgeon: Rennis ChrisZamora, Charla Criscione, MD;  [facility];  Service: Ophthalmology;  Laterality: Right;    FAMILY HISTORY Family History  Problem Relation Age of Onset  . Stroke Father     SOCIAL HISTORY Social History   Tobacco Use  . Smoking status: Never Smoker  . Smokeless tobacco: Never Used  Substance Use Topics  . Alcohol use: No    Frequency: Never  . Drug use: No         OPHTHALMIC EXAM:  Base Eye Exam    Visual Acuity (Snellen - Linear)      Right Left   Dist [place] 20/80 -2 20/40 -2   Dist ph [place] 20/50 +1 20/30 -2       Tonometry (Tonopen, 7:42 AM)      Right Left   Pressure 16 14       Pupils      Dark Light Shape React APD   Right 3 2 Round Minimal 0   Left 3 2 Round Minimal 0       Visual Fields      Left Right    Full Full       Extraocular Movement      Right Left    Full Full       Neuro/Psych    Oriented x3: Yes   Mood/Affect: Normal       Dilation    Both eyes: 1.0% Mydriacyl, 2.5% Phenylephrine @ 7:42 AM        Slit Lamp and Fundus Exam    Slit Lamp Exam      Right Left   Lids/Lashes Dermatochalasis - upper lid, periorbital edema Dermatochalasis - upper lid   Conjunctiva/Sclera Subconjunctival hemorrhage superotemporal quad-improving, sutures intact White and quiet, sutures almost dissolved   Cornea Arcus, trace Punctate epithelial erosions Arcus, Inferior trace  Punctate epithelial erosions   Anterior Chamber Deep, 0.5+pigment Deep and quiet   Iris Round and dilated Round and dilated   Lens PC IOL in good postion with anterior capsular phimosis, 1+ Posterior capsular opacification PC IOL in good postion with anterior capsular phimosis, 1+ Posterior capsular opacification sparing center   Vitreous post vitrectomy, gas bubble gone Vitreous syneresis       Fundus Exam      Right Left   Disc Sharp rim, mild pallor, peripapillary drusen, PPP Pink and Sharp, +cupping  C/D Ratio 0.4 0.6   Macula flat; ERM/VMT gone, +drusen, recurrent cystic changes, no heme Blunted foveal reflex, Flat, Drusen, early Atrophy, RPE mottling, clumping, and atrophy +PEDs, No heme   Vessels Vascular attenuation Vascular attenuation   Periphery Attached, scattered peripheral drusen, peripapillary drusen, laser changes superiorly Attached with peripheral drusen          IMAGING AND PROCEDURES  Imaging and Procedures for 02/09/18  OCT, Retina - OU - Both Eyes       Right Eye Quality was good. Central Foveal Thickness: 258. Progression has worsened. Findings include retinal drusen , intraretinal fluid, outer retinal atrophy, intraretinal hyper-reflective material, abnormal foveal contour, pigment epithelial detachment, no SRF (Interval increase of focal IRF temporal macula; VMT stably resolved; mild residual schisis superior to macula caught on widefield).   Left Eye Quality was good. Central Foveal Thickness: 281. Progression has been stable. Findings include vitreous traction, pigment epithelial detachment, abnormal foveal contour, no SRF, retinal drusen , epiretinal membrane, outer retinal atrophy, intraretinal fluid (stable cystic changes below VMT).   Notes Images taken, stored on drive  Diagnosis / Impression:  OD: Interval increase in focal IRF temporal macula; VMT stably resolved; mild residual schisis superior to macula caught on widefield OS: Nonexudative  ARMD; VMT w/ cystic changes stable  Clinical management:  See below  Abbreviations: NFP - Normal foveal profile. CME - cystoid macular edema. PED - pigment epithelial detachment. IRF - intraretinal fluid. SRF - subretinal fluid. EZ - ellipsoid zone. ERM - epiretinal membrane. ORA - outer retinal atrophy. ORT - outer retinal tubulation. SRHM - subretinal hyper-reflective material         Intravitreal Injection, Pharmacologic Agent - OD - Right Eye       Time Out 06/30/2019. 8:07 AM. Confirmed correct patient, procedure, site, and patient consented.   Anesthesia Topical anesthesia was used. Anesthetic medications included Lidocaine 2%, Proparacaine 0.5%.   Procedure Preparation included 5% betadine to ocular surface, eyelid speculum. A 30 gauge needle was used.   Injection:  1.25 mg Bevacizumab (AVASTIN) SOLN   NDC: 16109-604-54, Lot: [id number] , Expiration date: 08/23/2019   Route: Intravitreal, Site: Right Eye, Waste: 0 mL  Post-op Post injection exam found visual acuity of at least counting fingers. The patient tolerated the procedure well. There were no complications. The patient received written and verbal post procedure care education.                 ASSESSMENT/PLAN:    ICD-10-CM   1. 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 Retinal edema  H35.81 OCT, Retina - OU - Both Eyes  5. Pseudophakia of both eyes  Z96.1   6. Vitreous syneresis of left eye  H43.392     1. 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    - S/P IVA OD #1 (12.03.18), #2 (01.02.19), #3 (01.30.19), #4 (03.01.19), #5 (04.01.19), #6  (04.30.19), #7 (06.05.19), #8 (07.19.19), #9 (08.30.19), #10 (10.14.19), #11 (11.18.19), #12 (12.30.19), #13 (02.10.20), #14 (04.06.20)  - has had good response to IVA  - intraretinal heme improved on exam  - FA (04.01.19) shows mild CNVM OD consistent with exudative ARMD  - OCT shows interval increase  in focal IRF temporal macula  - recommend IVA OD #15 today, 09.17.20 - due to increase in IRF  - pt wishes to proceed  - RBA of procedure discussed, questions answered  - informed consent obtained and signed  - see procedure note  - f/u 4 weeks  2. Age related macular degeneration, non-exudative, OS  - The incidence, anatomy, and pathology of dry AMD, risk of progression, and the AREDS and AREDS 2 study including smoking risks discussed with patient.  - Continue amsler grid monitoring  - will continue to monitor for conversion to exudative ARMD  3,4. 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 Pseudophakia OU  - s/p CE/IOL OU  - s/p YAG OU  - beautiful surgeries, doing well  - monitor  6. Vitreous syneresis OS  - Pt with intermittent floaters OS  - No frank PVD on exam  - Discussed findings and prognosis  - No RT or RD on 360 peripheral exam  - Reviewed s/s of RT/RD  - Strict return precautions for any such RT/RD signs/symptoms   Ophthalmic Meds Ordered this visit:  Meds ordered this encounter  Medications  . Bevacizumab (AVASTIN) SOLN 1.25 mg       Return in about 4 weeks (around 07/28/2019) for f/u exu ARMD OD, DFE, OCT.  There are no Patient Instructions on  file for this visit.   Explained the diagnoses, plan, and follow up with the patient and they expressed understanding.  Patient expressed understanding of the importance of proper follow up care.   This document serves as a record of services personally performed by Karie Chimera, MD, PhD. It was created on their behalf by Laurian Brim, OA, an ophthalmic assistant. The creation of this record is the provider's dictation and/or activities during the visit.    Electronically signed by: Laurian Brim, OA  09.16.2020 8:43 AM     Karie Chimera, M.D., Ph.D. Diseases & Surgery of the Retina and Vitreous [facility]  I have reviewed the above documentation for accuracy and completeness, and I agree with the above. Karie Chimera, M.D., Ph.D. 06/30/19 8:43 AM     Abbreviations: M myopia (nearsighted); A astigmatism; H hyperopia (farsighted); P presbyopia; Mrx spectacle prescription;  CTL contact lenses; OD right eye; OS left eye; OU both eyes  XT exotropia; ET esotropia; PEK punctate epithelial keratitis; PEE punctate epithelial erosions; DES dry eye syndrome; MGD meibomian gland dysfunction; ATs artificial tears; PFAT's preservative free artificial tears; NSC nuclear sclerotic cataract; PSC posterior subcapsular cataract; ERM epi-retinal membrane; PVD posterior vitreous detachment; RD retinal detachment; DM diabetes mellitus; DR diabetic retinopathy; NPDR non-proliferative diabetic retinopathy; PDR proliferative diabetic retinopathy; CSME clinically significant macular edema; DME diabetic macular edema; dbh dot blot hemorrhages; CWS cotton  wool spot; POAG primary open angle glaucoma; C/D cup-to-disc ratio; HVF humphrey visual field; GVF goldmann visual field; OCT optical coherence tomography; IOP intraocular pressure; BRVO Branch retinal vein occlusion; CRVO central retinal vein occlusion; CRAO central retinal artery occlusion; BRAO branch retinal artery occlusion; RT retinal  tear; SB scleral buckle; PPV pars plana vitrectomy; VH Vitreous hemorrhage; PRP panretinal laser photocoagulation; IVK intravitreal kenalog; VMT vitreomacular traction; MH Macular hole;  NVD neovascularization of the disc; NVE neovascularization elsewhere; AREDS age related eye disease study; ARMD age related macular degeneration; POAG primary open angle glaucoma; EBMD epithelial/anterior basement membrane dystrophy; ACIOL anterior chamber intraocular lens; IOL intraocular lens; PCIOL posterior chamber intraocular lens; Phaco/IOL phacoemulsification with intraocular lens placement; PRK photorefractive keratectomy; LASIK laser assisted in situ keratomileusis; HTN hypertension; DM diabetes mellitus; COPD chronic obstructive pulmonary disease

## 2019-06-30 ENCOUNTER — Other Ambulatory Visit: Payer: Self-pay

## 2019-06-30 ENCOUNTER — Ambulatory Visit (INDEPENDENT_AMBULATORY_CARE_PROVIDER_SITE_OTHER): Payer: Medicare HMO | Admitting: Ophthalmology

## 2019-06-30 ENCOUNTER — Encounter (INDEPENDENT_AMBULATORY_CARE_PROVIDER_SITE_OTHER): Payer: Self-pay | Admitting: Ophthalmology

## 2019-06-30 DIAGNOSIS — H353121 Nonexudative age-related macular degeneration, left eye, early dry stage: Secondary | ICD-10-CM | POA: Diagnosis not present

## 2019-06-30 DIAGNOSIS — H43392 Other vitreous opacities, left eye: Secondary | ICD-10-CM | POA: Diagnosis not present

## 2019-06-30 DIAGNOSIS — Z961 Presence of intraocular lens: Secondary | ICD-10-CM

## 2019-06-30 DIAGNOSIS — H3581 Retinal edema: Secondary | ICD-10-CM | POA: Diagnosis not present

## 2019-06-30 DIAGNOSIS — H353211 Exudative age-related macular degeneration, right eye, with active choroidal neovascularization: Secondary | ICD-10-CM | POA: Diagnosis not present

## 2019-06-30 DIAGNOSIS — H43823 Vitreomacular adhesion, bilateral: Secondary | ICD-10-CM

## 2019-06-30 MED ORDER — BEVACIZUMAB CHEMO INJECTION 1.25MG/0.05ML SYRINGE FOR KALEIDOSCOPE
1.2500 mg | INTRAVITREAL | Status: AC | PRN
  Administered 2019-06-30: 1.25 mg via INTRAVITREAL

## 2019-07-11 DIAGNOSIS — Z23 Encounter for immunization: Secondary | ICD-10-CM | POA: Diagnosis not present

## 2019-07-19 NOTE — Progress Notes (Signed)
[facility] - Clinic Note  07/28/2019     CHIEF COMPLAINT Patient presents for Retina Follow Up   HISTORY OF PRESENT ILLNESS: Lindsay Harvey is a 83 y.o. female who presents to the clinic today for:   HPI    Retina Follow Up    Patient presents with  Wet AMD.  In both eyes.  This started weeks ago.  Severity is moderate.  Duration of weeks.  Since onset it is stable.  I, the attending physician,  performed the HPI with the patient and updated documentation appropriately.          Comments    Patient is unsure if her vision is improving and believes it is staying about the same OU.  Patient has some discomfort OU; denies eye pain.  Patient denies any new or worsening floaters or fol OU.  CH interpreter present today.       Last edited by Rennis Chris, MD on 07/28/2019 11:07 AM. (History)    pt states she is doing well  Referring physician: Dimitri Ped, MD [address] [place],  [place] [address]  HISTORICAL INFORMATION:   Selected notes from the MEDICAL RECORD NUMBER Referred by Dr. Marcha Solders for concern of AMD OU, CNV;  LEE- 11.20.18 (C. [name]) [BCVA OD: 20/40 OS 20/30] Ocular Hx- DES OU, pseudophakia OU, keratoconjunctivitis sicca OU PMH- high chol.    CURRENT MEDICATIONS: Current Outpatient Medications (Ophthalmic Drugs)  Medication Sig  . carboxymethylcellulose (REFRESH PLUS) 0.5 % SOLN Place 1 drop into both eyes 3 (three) times daily as needed (dry/irritated eyes).   . prednisoLONE acetate (PRED FORTE) 1 % ophthalmic suspension Place 1 drop into the right eye 4 (four) times daily.  Marland Kitchen REFRESH OPTIVE MEGA-3 0.5-1-0.5 % SOLN Place 1 drop into both eyes 3 (three) times daily as needed (discomfort/eye irritation.).   No current facility-administered medications for this visit.  (Ophthalmic Drugs)   Current Outpatient Medications (Other)  Medication Sig  . calcium carbonate (OSCAL) 1500 (600 Ca) MG TABS tablet Take 600 mg of  elemental calcium by mouth 2 (two) times a day.  . Cholecalciferol (VITAMIN D3) 50 MCG (2000 UT) TABS Take 2,000 Units by mouth daily.  . Multiple Vitamins-Minerals (EMERGEN-C IMMUNE PO) Take 1 packet by mouth daily as needed (for immune support).  . Multiple Vitamins-Minerals (PRESERVISION AREDS 2 PO) Take 1 tablet by mouth 2 (two) times a day.   . simvastatin (ZOCOR) 40 MG tablet Take 10 mg by mouth every evening. 1/4 tablet at night   Current Facility-Administered Medications (Other)  Medication Route  . Bevacizumab (AVASTIN) SOLN 1.25 mg Intravitreal  . Bevacizumab (AVASTIN) SOLN 1.25 mg Intravitreal  . Bevacizumab (AVASTIN) SOLN 1.25 mg Intravitreal  . Bevacizumab (AVASTIN) SOLN 1.25 mg Intravitreal  . Bevacizumab (AVASTIN) SOLN 1.25 mg Intravitreal  . Bevacizumab (AVASTIN) SOLN 1.25 mg Intravitreal  . Bevacizumab (AVASTIN) SOLN 1.25 mg Intravitreal  . Bevacizumab (AVASTIN) SOLN 1.25 mg Intravitreal  . Bevacizumab (AVASTIN) SOLN 1.25 mg Intravitreal  . Bevacizumab (AVASTIN) SOLN 1.25 mg Intravitreal  . Bevacizumab (AVASTIN) SOLN 1.25 mg Intravitreal  . Bevacizumab (AVASTIN) SOLN 1.25 mg Intravitreal  . Bevacizumab (AVASTIN) SOLN 1.25 mg Intravitreal      REVIEW OF SYSTEMS: ROS    Positive for: Gastrointestinal, Eyes   Negative for: Constitutional, Neurological, Skin, Genitourinary, Musculoskeletal, HENT, Endocrine, Cardiovascular, Respiratory, Psychiatric, Allergic/Imm, Heme/Lymph   Last edited by Corrinne Eagle on 07/28/2019  8:23 AM. (History)  ALLERGIES No Known Allergies  PAST MEDICAL HISTORY Past Medical History:  Diagnosis Date  . Allergies   . Cataract 2013   OU  . 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 Headache   . Hypercholesterolemia   . Macular degeneration    Wet OD, Dry OS  . Wears glasses    Past Surgical History:  Procedure Laterality Date  . 25 GAUGE PARS PLANA VITRECTOMY WITH 20 GAUGE MVR PORT FOR MACULAR  HOLE Right 04/07/2019   Procedure: 25 GAUGE PARS PLANA VITRECTOMY WITH 20 GAUGE MVR PORT FOR MACULAR HOLE;  Surgeon: Rennis Chris, MD;  Location: [facility] OR;  Service: Ophthalmology;  Laterality: Right;  . CATARACT EXTRACTION     2013  . DILATION AND CURETTAGE OF UTERUS    . EYE SURGERY    . GAS/FLUID EXCHANGE Right 04/07/2019   Procedure: Gas/Fluid Exchange;  Surgeon: Rennis Chris, MD;  Location: [facility] OR;  Service: Ophthalmology;  Laterality: Right;  . MEMBRANE PEEL Right 04/07/2019   Procedure: Eula Flax;  Surgeon: Rennis Chris, MD;  Location: [facility] OR;  Service: Ophthalmology;  Laterality: Right;  . PHOTOCOAGULATION WITH LASER Right 04/07/2019   Procedure: Photocoagulation With Laser;  Surgeon: Rennis Chris, MD;  Location: [place] OR;  Service: Ophthalmology;  Laterality: Right;    FAMILY HISTORY Family History  Problem Relation Age of Onset  . Stroke Father     SOCIAL HISTORY Social History   Tobacco Use  . Smoking status: Never Smoker  . Smokeless tobacco: Never Used  Substance Use Topics  . Alcohol use: No    Frequency: Never  . Drug use: No         OPHTHALMIC EXAM:  Base Eye Exam    Visual Acuity (Snellen - Linear)      Right Left   Dist cc 20/40 -2 20/40 +1   Dist ph cc NI 20/30 -2       Tonometry (Tonopen, 8:28 AM)      Right Left   Pressure 14 15       Pupils      Dark Light Shape React APD   Right 4 3 Round Brisk 0   Left 4 3 Round Brisk 0       Visual Fields      Left Right    Full Full       Extraocular Movement      Right Left    Full Full       Neuro/Psych    Oriented x3: Yes   Mood/Affect: Normal       Dilation    Both eyes: 1.0% Mydriacyl, 2.5% Phenylephrine @ 8:28 AM        Slit Lamp and Fundus Exam    Slit Lamp Exam      Right Left   Lids/Lashes Dermatochalasis - upper lid, periorbital edema Dermatochalasis - upper lid   Conjunctiva/Sclera White and quiet White and quiet   Cornea Arcus, trace Punctate epithelial erosions Arcus,  Inferior trace Punctate epithelial erosions   Anterior Chamber Deep, 0.5+pigment Deep and quiet   Iris Round and dilated Round and dilated   Lens PC IOL in good postion with anterior capsular phimosis, 1+ Posterior capsular opacification PC IOL in good postion with anterior capsular phimosis, 1+ Posterior capsular opacification sparing center   Vitreous post vitrectomy Vitreous syneresis       Fundus Exam      Right Left   Disc Sharp rim,  mild pallor, peripapillary drusen, PPP Pink and Sharp, +cupping   C/D Ratio 0.4 0.6   Macula flat; ERM/VMT gone, +drusen, recurrent cystic changes resolved, no heme Blunted foveal reflex, Flat, Drusen, early Atrophy, RPE mottling, clumping, and atrophy +PEDs, No heme   Vessels Vascular attenuation Vascular attenuation   Periphery Attached, scattered peripheral drusen, peripapillary drusen, laser changes superiorly Attached with peripheral drusen        Refraction    Wearing Rx      Sphere Cylinder Axis Add   Right -1.00 +0.75 180 +2.75   Left -0.50 +0.75 004 +2.75          IMAGING AND PROCEDURES  Imaging and Procedures for 02/09/18  OCT, Retina - OU - Both Eyes       Right Eye Quality was good. Central Foveal Thickness: 236. Progression has improved. Findings include retinal drusen , outer retinal atrophy, intraretinal hyper-reflective material, abnormal foveal contour, pigment epithelial detachment, no SRF, no IRF (Interval resolution of focal IRF temporal macula; VMT stably resolved; mild residual schisis superior to macula caught on widefield).   Left Eye Quality was good. Central Foveal Thickness: 283. Progression has been stable. Findings include vitreous traction, pigment epithelial detachment, abnormal foveal contour, no SRF, retinal drusen , epiretinal membrane, outer retinal atrophy, intraretinal fluid (stable cystic changes below VMT).   Notes Images taken, stored on drive  Diagnosis / Impression:  OD: Interval resolution of  focal IRF temporal macula; VMT stably resolved; mild residual schisis superior to macula caught on widefield OS: Nonexudative ARMD; VMT w/ cystic changes stable  Clinical management:  See below  Abbreviations: NFP - Normal foveal profile. CME - cystoid macular edema. PED - pigment epithelial detachment. IRF - intraretinal fluid. SRF - subretinal fluid. EZ - ellipsoid zone. ERM - epiretinal membrane. ORA - outer retinal atrophy. ORT - outer retinal tubulation. SRHM - subretinal hyper-reflective material         Intravitreal Injection, Pharmacologic Agent - OD - Right Eye       Time Out 07/28/2019. 8:21 AM. Confirmed correct patient, procedure, site, and patient consented.   Anesthesia Topical anesthesia was used. Anesthetic medications included Lidocaine 2%, Proparacaine 0.5%.   Procedure Preparation included 5% betadine to ocular surface, eyelid speculum. A 30 gauge needle was used.   Injection:  1.25 mg Bevacizumab (AVASTIN) SOLN   NDC: 56387-564-33, Lot: [id number] , Expiration date: 09/30/2019   Route: Intravitreal, Site: Right Eye, Waste: 0 mL  Post-op Post injection exam found visual acuity of at least counting fingers. The patient tolerated the procedure well. There were no complications. The patient received written and verbal post procedure care education.                 ASSESSMENT/PLAN:    ICD-10-CM   1. 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 Retinal edema  H35.81 OCT, Retina - OU - Both Eyes  5. Pseudophakia of both eyes  Z96.1   6. Vitreous syneresis of left eye  H43.392     1. Exudative age related macular degeneration with active choroidal  neovascularization OD.    - S/P IVA OD #1 (12.03.18), #2 (01.02.19), #3 (01.30.19), #4 (03.01.19), #5 (04.01.19), #6 (04.30.19), #7 (  06.05.19), #8 (07.19.19), #9 (08.30.19), #10 (10.14.19), #11 (11.18.19), #12 (12.30.19), #13 (02.10.20), #14 (04.06.20), #15 (09.17.20)  - has had good response to IVA  - FA (04.01.19) shows mild CNVM OD consistent with exudative ARMD  - OCT shows interval resolution of focal IRF temporal macula  - recommend IVA OD #16 today, 10.15.20 w/ f/u in 6 wks  - pt wishes to proceed  - RBA of procedure discussed, questions answered  - informed consent obtained and signed  - see procedure note  - f/u 6 weeks -- DFE/OCT/possible injection  2. Age related macular degeneration, non-exudative, OS  - The incidence, anatomy, and pathology of dry AMD, risk of progression, and the AREDS and AREDS 2 study including smoking risks discussed with patient.  - Continue amsler grid monitoring  - will continue to monitor for conversion to exudative ARMD  3,4. 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 Pseudophakia OU  - s/p CE/IOL OU  - s/p YAG OU  - beautiful surgeries, doing well  - monitor  6. Vitreous syneresis OS  - Pt with intermittent floaters OS  - No frank PVD on exam  - Discussed findings and prognosis  - No RT or RD on 360 peripheral exam  - Reviewed s/s of RT/RD  - Strict return precautions for any such RT/RD signs/symptoms   Ophthalmic Meds Ordered this visit:  Meds ordered this encounter  Medications  . Bevacizumab (AVASTIN) SOLN 1.25 mg       Return in about 6 weeks (around  09/08/2019) for f/u exu ARMD OD, DFE, OCT.  There are no Patient Instructions on file for this visit.   Explained the diagnoses, plan, and follow up with the patient and they expressed understanding.  Patient expressed understanding of the importance of proper follow up care.   Electronically signed by: Herby AbrahamAshley English, COA 10.06.20 10:51AM  This document serves as a record of services personally performed by Karie ChimeraBrian G. Harsh Trulock, MD, PhD. It was created on their behalf by Laurian BrimAmanda Brown, OA, an ophthalmic assistant. The creation of this record is the provider's dictation and/or activities during the visit.    Electronically signed by: Laurian BrimAmanda Brown, OA 10.15.2020 9:45 PM    Karie ChimeraBrian G. Kasen Sako, M.D., Ph.D. Diseases & Surgery of the Retina and Vitreous [facility]  I have reviewed the above documentation for accuracy and completeness, and I agree with the above. Karie ChimeraBrian G. Ebrahim Deremer, M.D., Ph.D. 07/28/19 9:45 PM   Abbreviations: M myopia (nearsighted); A astigmatism; H hyperopia (farsighted); P presbyopia; Mrx spectacle prescription;  CTL contact lenses; OD right eye; OS left eye; OU both eyes  XT exotropia; ET esotropia; PEK punctate epithelial keratitis; PEE punctate epithelial erosions; DES dry eye syndrome; MGD meibomian gland dysfunction; ATs artificial tears; PFAT's preservative free artificial tears; NSC nuclear sclerotic cataract; PSC posterior subcapsular cataract; ERM epi-retinal membrane; PVD posterior  vitreous detachment; RD retinal detachment; DM diabetes mellitus; DR diabetic retinopathy; NPDR non-proliferative diabetic retinopathy; PDR proliferative diabetic retinopathy; CSME clinically significant macular edema; DME diabetic macular edema; dbh dot blot hemorrhages; CWS cotton wool spot; POAG primary open angle glaucoma; C/D cup-to-disc ratio; HVF humphrey visual field; GVF goldmann visual field; OCT optical coherence tomography; IOP intraocular pressure; BRVO Branch  retinal vein occlusion; CRVO central retinal vein occlusion; CRAO central retinal artery occlusion; BRAO branch retinal artery occlusion; RT retinal tear; SB scleral buckle; PPV pars plana vitrectomy; VH Vitreous hemorrhage; PRP panretinal laser photocoagulation; IVK intravitreal kenalog; VMT vitreomacular traction; MH Macular hole;  NVD neovascularization of the disc; NVE neovascularization elsewhere; AREDS age related eye disease study; ARMD age related macular degeneration; POAG primary open angle glaucoma; EBMD epithelial/anterior basement membrane dystrophy; ACIOL anterior chamber intraocular lens; IOL intraocular lens; PCIOL posterior chamber intraocular lens; Phaco/IOL phacoemulsification with intraocular lens placement; PRK photorefractive keratectomy; LASIK laser assisted in situ keratomileusis; HTN hypertension; DM diabetes mellitus; COPD chronic obstructive pulmonary disease

## 2019-07-28 ENCOUNTER — Encounter (INDEPENDENT_AMBULATORY_CARE_PROVIDER_SITE_OTHER): Payer: Self-pay | Admitting: Ophthalmology

## 2019-07-28 ENCOUNTER — Ambulatory Visit (INDEPENDENT_AMBULATORY_CARE_PROVIDER_SITE_OTHER): Payer: Medicare HMO | Admitting: Ophthalmology

## 2019-07-28 ENCOUNTER — Other Ambulatory Visit: Payer: Self-pay

## 2019-07-28 DIAGNOSIS — H353211 Exudative age-related macular degeneration, right eye, with active choroidal neovascularization: Secondary | ICD-10-CM

## 2019-07-28 DIAGNOSIS — Z961 Presence of intraocular lens: Secondary | ICD-10-CM | POA: Diagnosis not present

## 2019-07-28 DIAGNOSIS — H353121 Nonexudative age-related macular degeneration, left eye, early dry stage: Secondary | ICD-10-CM

## 2019-07-28 DIAGNOSIS — H43823 Vitreomacular adhesion, bilateral: Secondary | ICD-10-CM | POA: Diagnosis not present

## 2019-07-28 DIAGNOSIS — H43392 Other vitreous opacities, left eye: Secondary | ICD-10-CM

## 2019-07-28 DIAGNOSIS — H3581 Retinal edema: Secondary | ICD-10-CM | POA: Diagnosis not present

## 2019-07-28 MED ORDER — BEVACIZUMAB CHEMO INJECTION 1.25MG/0.05ML SYRINGE FOR KALEIDOSCOPE
1.2500 mg | INTRAVITREAL | Status: AC | PRN
  Administered 2019-07-28: 1.25 mg via INTRAVITREAL

## 2019-09-02 NOTE — Progress Notes (Signed)
[facility] - Clinic Note  09/13/2019     CHIEF COMPLAINT Patient presents for Retina Follow Up   HISTORY OF PRESENT ILLNESS: Lindsay Harvey is a 83 y.o. female who presents to the clinic today for:   HPI    Retina Follow Up    Patient presents with  Wet AMD.  In right eye.  This started 6 weeks ago.  Severity is moderate.  I, the attending physician,  performed the HPI with the patient and updated documentation appropriately.          Comments    Patient here for 6 weeks retina follow up for exu ARMD OD. Patient states vision doing good. No eye pain. Has less discomfort.        Last edited by Rennis Chris, MD on 09/13/2019  8:20 AM. (History)    pt states she thinks her vision is a little better  Referring physician: Dimitri Ped, MD [address] [place],  [place] [address]  HISTORICAL INFORMATION:   Selected notes from the MEDICAL RECORD NUMBER Referred by Dr. Marcha Solders for concern of AMD OU, CNV    CURRENT MEDICATIONS: Current Outpatient Medications (Ophthalmic Drugs)  Medication Sig  . carboxymethylcellulose (REFRESH PLUS) 0.5 % SOLN Place 1 drop into both eyes 3 (three) times daily as needed (dry/irritated eyes).   . prednisoLONE acetate (PRED FORTE) 1 % ophthalmic suspension Place 1 drop into the right eye 4 (four) times daily.  Marland Kitchen REFRESH OPTIVE MEGA-3 0.5-1-0.5 % SOLN Place 1 drop into both eyes 3 (three) times daily as needed (discomfort/eye irritation.).   No current facility-administered medications for this visit.  (Ophthalmic Drugs)   Current Outpatient Medications (Other)  Medication Sig  . calcium carbonate (OSCAL) 1500 (600 Ca) MG TABS tablet Take 600 mg of elemental calcium by mouth 2 (two) times a day.  . Cholecalciferol (VITAMIN D3) 50 MCG (2000 UT) TABS Take 2,000 Units by mouth daily.  . Multiple Vitamins-Minerals (EMERGEN-C IMMUNE PO) Take 1 packet by mouth daily as needed (for immune support).  . Multiple  Vitamins-Minerals (PRESERVISION AREDS 2 PO) Take 1 tablet by mouth 2 (two) times a day.   . simvastatin (ZOCOR) 40 MG tablet Take 10 mg by mouth every evening. 1/4 tablet at night   Current Facility-Administered Medications (Other)  Medication Route  . Bevacizumab (AVASTIN) SOLN 1.25 mg Intravitreal  . Bevacizumab (AVASTIN) SOLN 1.25 mg Intravitreal  . Bevacizumab (AVASTIN) SOLN 1.25 mg Intravitreal  . Bevacizumab (AVASTIN) SOLN 1.25 mg Intravitreal  . Bevacizumab (AVASTIN) SOLN 1.25 mg Intravitreal  . Bevacizumab (AVASTIN) SOLN 1.25 mg Intravitreal  . Bevacizumab (AVASTIN) SOLN 1.25 mg Intravitreal  . Bevacizumab (AVASTIN) SOLN 1.25 mg Intravitreal  . Bevacizumab (AVASTIN) SOLN 1.25 mg Intravitreal  . Bevacizumab (AVASTIN) SOLN 1.25 mg Intravitreal  . Bevacizumab (AVASTIN) SOLN 1.25 mg Intravitreal  . Bevacizumab (AVASTIN) SOLN 1.25 mg Intravitreal  . Bevacizumab (AVASTIN) SOLN 1.25 mg Intravitreal      REVIEW OF SYSTEMS: ROS    Positive for: Gastrointestinal, Eyes   Negative for: Constitutional, Neurological, Skin, Genitourinary, Musculoskeletal, HENT, Endocrine, Cardiovascular, Respiratory, Psychiatric, Allergic/Imm, Heme/Lymph   Last edited by Laddie Aquas, COA on 09/13/2019  8:13 AM. (History)       ALLERGIES No Known Allergies  PAST MEDICAL HISTORY Past Medical History:  Diagnosis Date  . Allergies   . Cataract 2013   OU  . 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 Headache   . Hypercholesterolemia   . Macular degeneration    Wet OD, Dry OS  . Wears glasses    Past Surgical History:  Procedure Laterality Date  . [place] VITRECTOMY WITH 20 GAUGE MVR PORT FOR MACULAR HOLE Right 04/07/2019   Procedure: 25 GAUGE PARS PLANA VITRECTOMY WITH 20 GAUGE MVR PORT FOR MACULAR HOLE;  Surgeon: [name], MD;  Location: [place];  Service: Ophthalmology;  Laterality: Right;  . CATARACT EXTRACTION     2013  . DILATION AND  CURETTAGE OF UTERUS    . EYE SURGERY    . GAS/FLUID EXCHANGE Right 04/07/2019   Procedure: Gas/Fluid Exchange;  Surgeon: [name], MD;  Location: [place];  Service: Ophthalmology;  Laterality: Right;  . MEMBRANE PEEL Right 04/07/2019   Procedure: [name];  Surgeon: [name], MD;  Location: [place];  Service: Ophthalmology;  Laterality: Right;  . PHOTOCOAGULATION WITH LASER Right 04/07/2019   Procedure: Photocoagulation With Laser;  Surgeon: [name], MD;  Location: [place];  Service: Ophthalmology;  Laterality: Right;    FAMILY HISTORY Family History  Problem Relation Age of Onset  . Stroke Father     SOCIAL HISTORY Social History   Tobacco Use  . Smoking status: Never Smoker  . Smokeless tobacco: Never Used  Substance Use Topics  . Alcohol use: No    Frequency: Never  . Drug use: No         OPHTHALMIC EXAM:  Base Eye Exam    Visual Acuity (Snellen - Linear)      Right Left   Dist cc 20/40 -2 20/30 -2   Dist ph cc 20/40 NI   Correction: Glasses       Tonometry (Tonopen, 8:10 AM)      Right Left   Pressure 13 14       Pupils      Dark Light Shape React APD   Right 4 3 Round Brisk None   Left 4 3 Round Brisk None       Visual Fields (Counting fingers)      Left Right    Full Full       Extraocular Movement      Right Left    Full Full       Neuro/Psych    Oriented x3: Yes   Mood/Affect: Normal       Dilation    Both eyes: 1.0% Mydriacyl, 2.5% Phenylephrine @ 8:10 AM        Slit Lamp and Fundus Exam    Slit Lamp Exam      Right Left   Lids/Lashes Dermatochalasis - upper lid, periorbital edema Dermatochalasis - upper lid   Conjunctiva/Sclera White and quiet White and quiet   Cornea Arcus, trace Punctate epithelial erosions Arcus, Inferior trace Punctate epithelial erosions   Anterior Chamber Deep, 0.5+pigment Deep and quiet   Iris Round and dilated Round and dilated   Lens PC IOL in good postion with anterior capsular phimosis,  1+ Posterior capsular opacification PC IOL in good postion with anterior capsular phimosis, 1+ Posterior capsular opacification sparing center   Vitreous post vitrectomy Vitreous syneresis       Fundus Exam      Right Left   Disc Sharp rim, mild pallor, peripapillary drusen, PPP Pink and Sharp, +cupping   C/D Ratio 0.4 0.6   Macula flat; ERM/VMT gone, refractile drusen, recurrent cystic changes resolved, no heme Blunted foveal reflex, Flat, Drusen,  early Atrophy, RPE mottling and clumping, +PEDs, No heme   Vessels Vascular attenuation Vascular attenuation   Periphery Attached, scattered peripheral drusen, peripapillary drusen, laser changes superiorly Attached with peripheral drusen        Refraction    Wearing Rx      Sphere Cylinder Axis Add   Right -1.00 +0.75 180 +2.75   Left -0.50 +0.75 004 +2.75          IMAGING AND PROCEDURES  Imaging and Procedures for 02/09/18  OCT, Retina - OU - Both Eyes       Right Eye Quality was good. Central Foveal Thickness: 240. Progression has been stable. Findings include retinal drusen , outer retinal atrophy, intraretinal hyper-reflective material, abnormal foveal contour, pigment epithelial detachment, no SRF, no IRF (Stable resolution of focal IRF temporal macula; VMT stably resolved).   Left Eye Quality was good. Central Foveal Thickness: 286. Progression has been stable. Findings include vitreous traction, pigment epithelial detachment, abnormal foveal contour, no SRF, retinal drusen , epiretinal membrane, outer retinal atrophy, intraretinal fluid (Stable VMT; persistent cystic changes below VMT).   Notes Images taken, stored on drive  Diagnosis / Impression:  OD: stable resolution of focal IRF temporal macula; VMT stably resolved OS: Nonexudative ARMD; Stable VMT; persistent cystic changes below VMT  Clinical management:  See below  Abbreviations: NFP - Normal foveal profile. CME - cystoid macular edema. PED - pigment  epithelial detachment. IRF - intraretinal fluid. SRF - subretinal fluid. EZ - ellipsoid zone. ERM - epiretinal membrane. ORA - outer retinal atrophy. ORT - outer retinal tubulation. SRHM - subretinal hyper-reflective material         Intravitreal Injection, Pharmacologic Agent - OD - Right Eye       Time Out 09/13/2019. 8:12 AM. Confirmed correct patient, procedure, site, and patient consented.   Anesthesia Topical anesthesia was used. Anesthetic medications included Lidocaine 2%, Proparacaine 0.5%.   Procedure Preparation included 5% betadine to ocular surface, eyelid speculum. A 30 gauge needle was used.   Injection:  1.25 mg Bevacizumab (AVASTIN) SOLN   NDC: 93235-573-22, Lot: [id number] , Expiration date: 11/18/2019   Route: Intravitreal, Site: Right Eye, Waste: 0 mL  Post-op Post injection exam found visual acuity of at least counting fingers. The patient tolerated the procedure well. There were no complications. The patient received written and verbal post procedure care education.                 ASSESSMENT/PLAN:    ICD-10-CM   1. 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 Retinal edema  H35.81 OCT, Retina - OU - Both Eyes  5. Pseudophakia of both eyes  Z96.1   6. Vitreous syneresis of left eye  H43.392   7. Vitreous syneresis of both eyes  H43.393   8. Vitreomacular adhesion of right eye  H43.821     1. 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    - S/P IVA OD #1 (12.03.18), #2 (01.02.19), #3 (01.30.19), #4 (03.01.19), #5 (04.01.19), #6 (04.30.19), #7 (06.05.19), #8 (07.19.19), #9 (08.30.19), #10 (10.14.19),  #11 (11.18.19), #12 (12.30.19), #13 (02.10.20), #14 (04.06.20), #15 (09.17.20), #16 (10.15.20)  - has had good response to IVA  -  FA (04.01.19) shows mild CNVM OD consistent with exudative ARMD  - OCT shows stable resolution of focal IRF temporal macula  - BCVA 20/40 stable -- likely new baseline  - recommend IVA OD #17 today, 12.01.20 w/ extension to 7 wks  - pt wishes to proceed  - RBA of procedure discussed, questions answered  - informed consent obtained and signed  - see procedure note  - f/u 7 weeks -- DFE/OCT/possible injection  2. Age related macular degeneration, non-exudative, OS  - The incidence, anatomy, and pathology of dry AMD, risk of progression, and the AREDS and AREDS 2 study including smoking risks discussed with patient.  - Continue amsler grid monitoring  - will continue to monitor for conversion to exudative ARMD  3,4. 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 Pseudophakia OU  - s/p CE/IOL OU  - s/p YAG OU  - beautiful surgeries, doing well  - monitor  6. Vitreous syneresis OS  - Pt with intermittent floaters OS  - No frank PVD on exam  - Discussed findings and prognosis  - No RT or RD on 360 peripheral exam  - Reviewed s/s of RT/RD  - Strict return precautions for any such RT/RD signs/symptoms   Ophthalmic Meds Ordered this visit:  Meds ordered this encounter  Medications  . Bevacizumab (AVASTIN) SOLN 1.25 mg       Return in about 7 weeks (around 11/01/2019) for f/u exu ARMD OD, DFE, OCT.  There are no Patient Instructions on file for this visit.   Explained the  diagnoses, plan, and follow up with the patient and they expressed understanding.  Patient expressed understanding of the importance of proper follow up care.   This document serves as a record of services personally performed by Karie ChimeraBrian G. Sina Sumpter, MD, PhD. It was created on their behalf by Cristopher EstimableAndrew Baxley, COT an ophthalmic technician. The creation of this record is the provider's dictation and/or activities during the visit.    Electronically signed by: Cristopher Estimablendrew Baxley, COT 09/02/19 @ 9:44 PM   This document serves as a record of services personally performed by Karie ChimeraBrian G. Isai Gottlieb, MD, PhD. It was created on their behalf by Laurian BrimAmanda Brown, OA, an ophthalmic assistant. The creation of this record is the provider's dictation and/or activities during the visit.    Electronically signed by: Laurian BrimAmanda Brown, OA 12.01.2020 9:44 PM  Karie ChimeraBrian G. Verlyn Lambert, M.D., Ph.D. Diseases & Surgery of the Retina and Vitreous [facility] 09/13/2019   I have reviewed the above documentation for accuracy and completeness, and I agree with the above. Karie ChimeraBrian G. Sharon Stapel, M.D., Ph.D. 09/13/19 9:47 PM    Abbreviations: M myopia (nearsighted); A astigmatism; H hyperopia (farsighted); P presbyopia; Mrx spectacle prescription;  CTL contact lenses; OD right eye; OS left eye; OU both eyes  XT exotropia; ET esotropia; PEK punctate epithelial keratitis; PEE  punctate epithelial erosions; DES dry eye syndrome; MGD meibomian gland dysfunction; ATs artificial tears; PFAT's preservative free artificial tears; NSC nuclear sclerotic cataract; PSC posterior subcapsular cataract; ERM epi-retinal membrane; PVD posterior vitreous detachment; RD retinal detachment; DM diabetes mellitus; DR diabetic retinopathy; NPDR non-proliferative diabetic retinopathy; PDR proliferative diabetic retinopathy; CSME clinically significant macular edema; DME diabetic macular edema; dbh dot blot hemorrhages; CWS cotton wool spot; POAG primary open angle  glaucoma; C/D cup-to-disc ratio; HVF humphrey visual field; GVF goldmann visual field; OCT optical coherence tomography; IOP intraocular pressure; BRVO Branch retinal vein occlusion; CRVO central retinal vein occlusion; CRAO central retinal artery occlusion; BRAO branch retinal artery occlusion; RT retinal tear; SB scleral buckle; PPV pars plana vitrectomy; VH Vitreous hemorrhage; PRP panretinal laser photocoagulation; IVK intravitreal kenalog; VMT vitreomacular traction; MH Macular hole;  NVD neovascularization of the disc; NVE neovascularization elsewhere; AREDS age related eye disease study; ARMD age related macular degeneration; POAG primary open angle glaucoma; EBMD epithelial/anterior basement membrane dystrophy; ACIOL anterior chamber intraocular lens; IOL intraocular lens; PCIOL posterior chamber intraocular lens; Phaco/IOL phacoemulsification with intraocular lens placement; PRK photorefractive keratectomy; LASIK laser assisted in situ keratomileusis; HTN hypertension; DM diabetes mellitus; COPD chronic obstructive pulmonary disease

## 2019-09-13 ENCOUNTER — Encounter (INDEPENDENT_AMBULATORY_CARE_PROVIDER_SITE_OTHER): Payer: Self-pay | Admitting: Ophthalmology

## 2019-09-13 ENCOUNTER — Other Ambulatory Visit: Payer: Self-pay

## 2019-09-13 ENCOUNTER — Ambulatory Visit (INDEPENDENT_AMBULATORY_CARE_PROVIDER_SITE_OTHER): Payer: Medicare HMO | Admitting: Ophthalmology

## 2019-09-13 DIAGNOSIS — H353121 Nonexudative age-related macular degeneration, left eye, early dry stage: Secondary | ICD-10-CM | POA: Diagnosis not present

## 2019-09-13 DIAGNOSIS — H43392 Other vitreous opacities, left eye: Secondary | ICD-10-CM | POA: Diagnosis not present

## 2019-09-13 DIAGNOSIS — H43823 Vitreomacular adhesion, bilateral: Secondary | ICD-10-CM

## 2019-09-13 DIAGNOSIS — H43393 Other vitreous opacities, bilateral: Secondary | ICD-10-CM | POA: Diagnosis not present

## 2019-09-13 DIAGNOSIS — H353211 Exudative age-related macular degeneration, right eye, with active choroidal neovascularization: Secondary | ICD-10-CM | POA: Diagnosis not present

## 2019-09-13 DIAGNOSIS — Z961 Presence of intraocular lens: Secondary | ICD-10-CM

## 2019-09-13 DIAGNOSIS — H3581 Retinal edema: Secondary | ICD-10-CM

## 2019-09-13 DIAGNOSIS — H43821 Vitreomacular adhesion, right eye: Secondary | ICD-10-CM | POA: Diagnosis not present

## 2019-09-13 MED ORDER — BEVACIZUMAB CHEMO INJECTION 1.25MG/0.05ML SYRINGE FOR KALEIDOSCOPE
1.2500 mg | INTRAVITREAL | Status: AC | PRN
  Administered 2019-09-13: 1.25 mg via INTRAVITREAL

## 2019-10-19 DIAGNOSIS — H353121 Nonexudative age-related macular degeneration, left eye, early dry stage: Secondary | ICD-10-CM | POA: Insufficient documentation

## 2019-10-19 NOTE — Progress Notes (Signed)
[facility] - Clinic Note  10/31/2019     CHIEF COMPLAINT Patient presents for Retina Follow Up   HISTORY OF PRESENT ILLNESS: Lindsay Harvey is a 84 y.o. female who presents to the clinic today for:   HPI    Retina Follow Up    Patient presents with  Dry AMD.  In right eye.  Since onset it is stable.  I, the attending physician,  performed the HPI with the patient and updated documentation appropriately.          Comments    F/U ARMD OD. Patient states her vision has been "good", denies new visual onsets. Pt is using Refresh PRN.        Last edited by Rennis Chris, MD on 11/01/2019 10:50 PM. (History)    pts daughter states that the pt uses a magnifying glass to read, pt is using Systane 2-3 times a day OU depending on how dry her eyes are  Referring physician: Pearson Grippe, MD [address] [place],  [place] [address]  HISTORICAL INFORMATION:   Selected notes from the MEDICAL RECORD NUMBER Referred by Dr. Marcha Solders for concern of AMD OU, CNV    CURRENT MEDICATIONS: Current Outpatient Medications (Ophthalmic Drugs)  Medication Sig  . carboxymethylcellulose (REFRESH PLUS) 0.5 % SOLN Place 1 drop into both eyes 3 (three) times daily as needed (dry/irritated eyes).   . prednisoLONE acetate (PRED FORTE) 1 % ophthalmic suspension Place 1 drop into the right eye 4 (four) times daily.  Marland Kitchen REFRESH OPTIVE MEGA-3 0.5-1-0.5 % SOLN Place 1 drop into both eyes 3 (three) times daily as needed (discomfort/eye irritation.).   No current facility-administered medications for this visit. (Ophthalmic Drugs)   Current Outpatient Medications (Other)  Medication Sig  . calcium carbonate (OSCAL) 1500 (600 Ca) MG TABS tablet Take 600 mg of elemental calcium by mouth 2 (two) times a day.  . Cholecalciferol (VITAMIN D3) 50 MCG (2000 UT) TABS Take 2,000 Units by mouth daily.  . Multiple Vitamins-Minerals (EMERGEN-C IMMUNE PO) Take 1 packet by mouth daily as needed  (for immune support).  . Multiple Vitamins-Minerals (PRESERVISION AREDS 2 PO) Take 1 tablet by mouth 2 (two) times a day.   . simvastatin (ZOCOR) 40 MG tablet Take 10 mg by mouth every evening. 1/4 tablet at night   Current Facility-Administered Medications (Other)  Medication Route  . Bevacizumab (AVASTIN) SOLN 1.25 mg Intravitreal  . Bevacizumab (AVASTIN) SOLN 1.25 mg Intravitreal  . Bevacizumab (AVASTIN) SOLN 1.25 mg Intravitreal  . Bevacizumab (AVASTIN) SOLN 1.25 mg Intravitreal  . Bevacizumab (AVASTIN) SOLN 1.25 mg Intravitreal  . Bevacizumab (AVASTIN) SOLN 1.25 mg Intravitreal  . Bevacizumab (AVASTIN) SOLN 1.25 mg Intravitreal  . Bevacizumab (AVASTIN) SOLN 1.25 mg Intravitreal  . Bevacizumab (AVASTIN) SOLN 1.25 mg Intravitreal  . Bevacizumab (AVASTIN) SOLN 1.25 mg Intravitreal  . Bevacizumab (AVASTIN) SOLN 1.25 mg Intravitreal  . Bevacizumab (AVASTIN) SOLN 1.25 mg Intravitreal  . Bevacizumab (AVASTIN) SOLN 1.25 mg Intravitreal      REVIEW OF SYSTEMS: ROS    Positive for: Eyes   Negative for: Constitutional, Gastrointestinal, Neurological, Skin, Genitourinary, Musculoskeletal, HENT, Endocrine, Cardiovascular, Respiratory, Psychiatric, Allergic/Imm, Heme/Lymph   Last edited by Eldridge Scot, LPN on 3/53/6144  8:37 AM. (History)       ALLERGIES No Known Allergies  PAST MEDICAL HISTORY Past Medical History:  Diagnosis Date  . Allergies   . Cataract 2013   OU  . 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 Headache   . Hypercholesterolemia   . Macular degeneration    Wet OD, Dry OS  . Wears glasses    Past Surgical History:  Procedure Laterality Date  . 25 GAUGE PARS PLANA VITRECTOMY WITH 20 GAUGE MVR PORT FOR MACULAR HOLE Right 04/07/2019   Procedure: 25 GAUGE PARS PLANA VITRECTOMY WITH 20 GAUGE MVR PORT FOR MACULAR HOLE;  Surgeon: Rennis Chris, MD;  Location: [facility] OR;  Service: Ophthalmology;  Laterality: Right;  . CATARACT EXTRACTION      2013  . DILATION AND CURETTAGE OF UTERUS    . EYE SURGERY    . GAS/FLUID EXCHANGE Right 04/07/2019   Procedure: Gas/Fluid Exchange;  Surgeon: Rennis Chris, MD;  Location: [facility] OR;  Service: Ophthalmology;  Laterality: Right;  . MEMBRANE PEEL Right 04/07/2019   Procedure: Eula Flax;  Surgeon: Rennis Chris, MD;  Location: [facility] OR;  Service: Ophthalmology;  Laterality: Right;  . PHOTOCOAGULATION WITH LASER Right 04/07/2019   Procedure: Photocoagulation With Laser;  Surgeon: Rennis Chris, MD;  Location: [facility] OR;  Service: Ophthalmology;  Laterality: Right;    FAMILY HISTORY Family History  Problem Relation Age of Onset  . Stroke Father     SOCIAL HISTORY Social History   Tobacco Use  . Smoking status: Never Smoker  . Smokeless tobacco: Never Used  Substance Use Topics  . Alcohol use: No  . Drug use: No         OPHTHALMIC EXAM:  Base Eye Exam    Visual Acuity (Snellen - Linear)      Right Left   Dist cc 20/50 -1 20/30   Dist ph cc NI NI   Correction: Glasses  Number scale        Tonometry (Tonopen, 8:46 AM)      Right Left   Pressure 12 9       Pupils      Dark Light Shape React APD   Right 3 2 Round Brisk None   Left 3 2 Round Brisk None       Visual Fields (Counting fingers)      Left Right    Full Full       Extraocular Movement      Right Left    Full, Ortho Full, Ortho       Neuro/Psych    Oriented x3: Yes   Mood/Affect: Normal       Dilation    Both eyes: 2.5% Phenylephrine @ 8:49 AM        Slit Lamp and Fundus Exam    Slit Lamp Exam      Right Left   Lids/Lashes Dermatochalasis - upper lid, periorbital edema Dermatochalasis - upper lid   Conjunctiva/Sclera White and quiet White and quiet   Cornea Arcus, 1+ Punctate epithelial erosions, mildly dry tear film, mild endo pigment Arcus, 1+ Punctate epithelial erosions, mildly dry tear film, mild endo pigment   Anterior Chamber Deep Deep and quiet   Iris Round and dilated Round and dilated    Lens PC IOL in good postion with anterior capsular phimosis, 1+ Posterior capsular opacification PC IOL in good postion with anterior capsular phimosis, 1+ Posterior capsular opacification sparing center   Vitreous post vitrectomy Vitreous syneresis       Fundus Exam      Right Left   Disc Sharp rim, mild pallor, peripapillary drusen, PPP Pink and Sharp, +cupping   C/D Ratio 0.4 0.6   Macula  flat; ERM/VMT gone, refractile drusen, cystic changes stably resolved, no heme Blunted foveal reflex, Flat, Drusen, early Atrophy, RPE mottling and clumping, +PEDs, No heme   Vessels Vascular attenuation Vascular attenuation, Copper wiring, mild Tortuousity, mild AV crossing changes   Periphery Attached, scattered peripheral drusen, peripapillary drusen, laser changes superiorly Attached with peripheral drusen          IMAGING AND PROCEDURES  Imaging and Procedures for 02/09/18  OCT, Retina - OU - Both Eyes       Right Eye Quality was good. Central Foveal Thickness: 233. Progression has been stable. Findings include retinal drusen , outer retinal atrophy, intraretinal hyper-reflective material, abnormal foveal contour, pigment epithelial detachment, no SRF, no IRF (Stable resolution of focal IRF temporal macula; VMT stably resolved).   Left Eye Quality was good. Central Foveal Thickness: 285. Progression has been stable. Findings include vitreous traction, pigment epithelial detachment, abnormal foveal contour, no SRF, retinal drusen , epiretinal membrane, outer retinal atrophy, intraretinal fluid (Stable VMT; persistent cystic changes below VMT).   Notes Images taken, stored on drive  Diagnosis / Impression:  OD: Exudative ARMD; stable resolution of focal IRF temporal macula; VMT stably resolved OS: Nonexudative ARMD; Stable VMT; persistent cystic changes below VMT  Clinical management:  See below  Abbreviations: NFP - Normal foveal profile. CME - cystoid macular edema. PED - pigment  epithelial detachment. IRF - intraretinal fluid. SRF - subretinal fluid. EZ - ellipsoid zone. ERM - epiretinal membrane. ORA - outer retinal atrophy. ORT - outer retinal tubulation. SRHM - subretinal hyper-reflective material         Intravitreal Injection, Pharmacologic Agent - OD - Right Eye       Time Out 10/31/2019. 9:09 AM. Confirmed correct patient, procedure, site, and patient consented.   Anesthesia Topical anesthesia was used. Anesthetic medications included Lidocaine 2%, Proparacaine 0.5%.   Procedure Preparation included 5% betadine to ocular surface, eyelid speculum. A 30 gauge needle was used.   Injection:  1.25 mg Bevacizumab (AVASTIN) SOLN   NDC: 54656-812-75, Lot: [id number] , Expiration date: 04/08/2020   Route: Intravitreal, Site: Right Eye, Waste: 0 mL  Post-op Post injection exam found visual acuity of at least counting fingers. The patient tolerated the procedure well. There were no complications. The patient received written and verbal post procedure care education.                 ASSESSMENT/PLAN:    ICD-10-CM   1. 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 Retinal edema  H35.81 OCT, Retina - OU - Both Eyes  5. Pseudophakia of both eyes  Z96.1   6. Vitreous syneresis of left eye  H43.392     1. 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    - S/P IVA OD #1 (12.03.18), #2 (01.02.19), #3 (01.30.19), #4 (03.01.19), #5 (04.01.19), #6 (04.30.19), #7 (06.05.19), #8 (07.19.19), #9 (08.30.19), #10 (10.14.19), #11 (11.18.19), #12 (12.30.19), #13 (02.10.20), #14 (04.06.20), #15 (09.17.20), #16 (10.15.20),  #17 (12.01.20)  - has had good response to IVA  - FA (04.01.19) shows mild CNVM OD consistent with exudative ARMD  - OCT shows stable resolution of focal IRF temporal macula  - BCVA slightly decreased  to 20/50 from 20/40   - recommend IVA OD #18 today, 01.18.21  - pt wishes to proceed  - RBA of procedure discussed, questions answered  - informed consent obtained and signed  - see procedure note  - f/u 8 weeks -- DFE/OCT/possible injection -- tx and ext as able  2. Age related macular degeneration, non-exudative, OS  - The incidence, anatomy, and pathology of dry AMD, risk of progression, and the AREDS and AREDS 2 study including smoking risks discussed with patient.  - Continue amsler grid monitoring  - will continue to monitor for conversion to exudative ARMD  3,4. 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 Pseudophakia OU  - s/p CE/IOL OU  - s/p YAG OU  - beautiful surgeries, doing well  - monitor  6. Vitreous syneresis OS  - Pt with intermittent floaters OS  - No frank PVD on exam  - Discussed findings and prognosis  - No RT or RD on 360 peripheral exam  - Reviewed s/s of RT/RD  - Strict return precautions for any such RT/RD signs/symptoms   Ophthalmic Meds Ordered this visit:  Meds ordered this encounter  Medications  . Bevacizumab (AVASTIN) SOLN 1.25 mg       Return in about 8 weeks (around 12/26/2019) for f/u exu ARMD OD, DFE, OCT.  There are no Patient Instructions on file for this visit.   Explained the diagnoses, plan, and follow up with the patient and they expressed understanding.  Patient expressed  understanding of the importance of proper follow up care.   This document serves as a record of services personally performed by Karie Chimera, MD, PhD. It was created on their behalf by Herby Abraham, COA, a certified ophthalmic assistant. The creation of this record is the provider's dictation and/or activities during the visit.    Electronically signed by: Herby Abraham, COA @TODAY @ 10:57 PM   This document serves as a record of services personally performed by , MD, PhD. It was created on their behalf by Karie Chimera, OA, an ophthalmic assistant. The creation of this record is the provider's dictation and/or activities during the visit.    Electronically signed by: Laurian Brim, OA 01.18.2021 10:57 PM   01.20.2021, M.D., Ph.D. Diseases & Surgery of the Retina and Vitreous [facility]  I have reviewed the above documentation for accuracy and completeness, and I agree with the above. [facility], M.D., Ph.D. 11/01/19 10:57 PM    Abbreviations: M myopia (nearsighted); A astigmatism; H hyperopia (farsighted); P presbyopia; Mrx spectacle prescription;  CTL contact lenses; OD right eye; OS left eye; OU both eyes  XT exotropia; ET esotropia; PEK punctate epithelial keratitis; PEE punctate epithelial erosions; DES dry eye syndrome; MGD meibomian gland dysfunction; ATs artificial tears; PFAT's preservative free artificial tears; NSC nuclear sclerotic cataract; PSC posterior subcapsular cataract; ERM epi-retinal membrane;  PVD posterior vitreous detachment; RD retinal detachment; DM diabetes mellitus; DR diabetic retinopathy; NPDR non-proliferative diabetic retinopathy; PDR proliferative diabetic retinopathy; CSME clinically significant macular edema; DME diabetic macular edema; dbh dot blot hemorrhages; CWS cotton wool spot; POAG primary open angle glaucoma; C/D cup-to-disc ratio; HVF humphrey visual field; GVF goldmann visual field; OCT optical  coherence tomography; IOP intraocular pressure; BRVO Branch retinal vein occlusion; CRVO central retinal vein occlusion; CRAO central retinal artery occlusion; BRAO branch retinal artery occlusion; RT retinal tear; SB scleral buckle; PPV pars plana vitrectomy; VH Vitreous hemorrhage; PRP panretinal laser photocoagulation; IVK intravitreal kenalog; VMT vitreomacular traction; MH Macular hole;  NVD neovascularization of the disc; NVE neovascularization elsewhere; AREDS age related eye disease study; ARMD age related macular degeneration; POAG primary open angle glaucoma; EBMD epithelial/anterior basement membrane dystrophy; ACIOL anterior chamber intraocular lens; IOL intraocular lens; PCIOL posterior chamber intraocular lens; Phaco/IOL phacoemulsification with intraocular lens placement; PRK photorefractive keratectomy; LASIK laser assisted in situ keratomileusis; HTN hypertension; DM diabetes mellitus; COPD chronic obstructive pulmonary disease

## 2019-10-31 ENCOUNTER — Other Ambulatory Visit: Payer: Self-pay

## 2019-10-31 ENCOUNTER — Ambulatory Visit (INDEPENDENT_AMBULATORY_CARE_PROVIDER_SITE_OTHER): Payer: Medicare HMO | Admitting: Ophthalmology

## 2019-10-31 ENCOUNTER — Encounter (INDEPENDENT_AMBULATORY_CARE_PROVIDER_SITE_OTHER): Payer: Self-pay | Admitting: Ophthalmology

## 2019-10-31 DIAGNOSIS — Z961 Presence of intraocular lens: Secondary | ICD-10-CM

## 2019-10-31 DIAGNOSIS — H43823 Vitreomacular adhesion, bilateral: Secondary | ICD-10-CM

## 2019-10-31 DIAGNOSIS — H353121 Nonexudative age-related macular degeneration, left eye, early dry stage: Secondary | ICD-10-CM | POA: Diagnosis not present

## 2019-10-31 DIAGNOSIS — H43392 Other vitreous opacities, left eye: Secondary | ICD-10-CM | POA: Diagnosis not present

## 2019-10-31 DIAGNOSIS — H3581 Retinal edema: Secondary | ICD-10-CM | POA: Diagnosis not present

## 2019-10-31 DIAGNOSIS — H353211 Exudative age-related macular degeneration, right eye, with active choroidal neovascularization: Secondary | ICD-10-CM | POA: Diagnosis not present

## 2019-11-01 ENCOUNTER — Encounter (INDEPENDENT_AMBULATORY_CARE_PROVIDER_SITE_OTHER): Payer: Self-pay | Admitting: Ophthalmology

## 2019-11-01 MED ORDER — BEVACIZUMAB CHEMO INJECTION 1.25MG/0.05ML SYRINGE FOR KALEIDOSCOPE
1.2500 mg | INTRAVITREAL | Status: AC | PRN
  Administered 2019-11-01: 1.25 mg via INTRAVITREAL

## 2019-12-20 NOTE — Progress Notes (Signed)
[facility] - Clinic Note  12/26/2019     CHIEF COMPLAINT Patient presents for Retina Follow Up   HISTORY OF PRESENT ILLNESS: Lindsay Harvey is a 84 y.o. female who presents to the clinic today for:   HPI    Retina Follow Up    Patient presents with  Wet AMD.  In right eye.  Severity is moderate.  Duration of 8 weeks.  Since onset it is stable.  I, the attending physician,  performed the HPI with the patient and updated documentation appropriately.          Comments    Patient states unsure if any vision changes. Using Refresh qam OU.        Last edited by Rennis Chris, MD on 12/26/2019  8:25 AM. (History)    pt is using Refresh tears for dryness   Referring physician: Pearson Grippe, MD [address] [place],  [place] [address]  HISTORICAL INFORMATION:   Selected notes from the MEDICAL RECORD NUMBER Referred by Dr. Marcha Solders for concern of AMD OU, CNV    CURRENT MEDICATIONS: Current Outpatient Medications (Ophthalmic Drugs)  Medication Sig  . carboxymethylcellulose (REFRESH PLUS) 0.5 % SOLN Place 1 drop into both eyes 3 (three) times daily as needed (dry/irritated eyes).   Marland Kitchen REFRESH OPTIVE MEGA-3 0.5-1-0.5 % SOLN Place 1 drop into both eyes 3 (three) times daily as needed (discomfort/eye irritation.).  Marland Kitchen prednisoLONE acetate (PRED FORTE) 1 % ophthalmic suspension Place 1 drop into the right eye 4 (four) times daily. (Patient not taking: Reported on 12/26/2019)   No current facility-administered medications for this visit. (Ophthalmic Drugs)   Current Outpatient Medications (Other)  Medication Sig  . calcium carbonate (OSCAL) 1500 (600 Ca) MG TABS tablet Take 600 mg of elemental calcium by mouth 2 (two) times a day.  . Cholecalciferol (VITAMIN D3) 50 MCG (2000 UT) TABS Take 2,000 Units by mouth daily.  . Multiple Vitamins-Minerals (EMERGEN-C IMMUNE PO) Take 1 packet by mouth daily as needed (for immune support).  . Multiple Vitamins-Minerals  (PRESERVISION AREDS 2 PO) Take 1 tablet by mouth 2 (two) times a day.   . simvastatin (ZOCOR) 40 MG tablet Take 10 mg by mouth every evening. 1/4 tablet at night   Current Facility-Administered Medications (Other)  Medication Route  . Bevacizumab (AVASTIN) SOLN 1.25 mg Intravitreal  . Bevacizumab (AVASTIN) SOLN 1.25 mg Intravitreal  . Bevacizumab (AVASTIN) SOLN 1.25 mg Intravitreal  . Bevacizumab (AVASTIN) SOLN 1.25 mg Intravitreal  . Bevacizumab (AVASTIN) SOLN 1.25 mg Intravitreal  . Bevacizumab (AVASTIN) SOLN 1.25 mg Intravitreal  . Bevacizumab (AVASTIN) SOLN 1.25 mg Intravitreal  . Bevacizumab (AVASTIN) SOLN 1.25 mg Intravitreal  . Bevacizumab (AVASTIN) SOLN 1.25 mg Intravitreal  . Bevacizumab (AVASTIN) SOLN 1.25 mg Intravitreal  . Bevacizumab (AVASTIN) SOLN 1.25 mg Intravitreal  . Bevacizumab (AVASTIN) SOLN 1.25 mg Intravitreal  . Bevacizumab (AVASTIN) SOLN 1.25 mg Intravitreal      REVIEW OF SYSTEMS: ROS    Positive for: Gastrointestinal, Eyes   Negative for: Constitutional, Neurological, Skin, Genitourinary, Musculoskeletal, HENT, Endocrine, Cardiovascular, Respiratory, Psychiatric, Allergic/Imm, Heme/Lymph   Last edited by Annalee Genta D, COT on 12/26/2019  8:16 AM. (History)       ALLERGIES No Known Allergies  PAST MEDICAL HISTORY Past Medical History:  Diagnosis Date  . Allergies   . Cataract 2013   OU  . 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 Headache   .  Hypercholesterolemia   . Macular degeneration    Wet OD, Dry OS  . Wears glasses    Past Surgical History:  Procedure Laterality Date  . 25 GAUGE PARS PLANA VITRECTOMY WITH 20 GAUGE MVR PORT FOR MACULAR HOLE Right 04/07/2019   Procedure: 25 GAUGE PARS PLANA VITRECTOMY WITH 20 GAUGE MVR PORT FOR MACULAR HOLE;  Surgeon: Rennis ChrisZamora, Merion Caton, MD;  [facility];  Service: Ophthalmology;  Laterality: Right;  . CATARACT EXTRACTION     2013  . DILATION AND CURETTAGE OF UTERUS    .  EYE SURGERY    . GAS/FLUID EXCHANGE Right 04/07/2019   Procedure: Gas/Fluid Exchange;  Surgeon: Rennis ChrisZamora, Levy Cedano, MD;  [facility];  Service: Ophthalmology;  Laterality: Right;  . MEMBRANE PEEL Right 04/07/2019   Procedure: Eula FlaxMembrane Peel;  Surgeon: Rennis ChrisZamora, Alhassan Everingham, MD;  [facility];  Service: Ophthalmology;  Laterality: Right;  . PHOTOCOAGULATION WITH LASER Right 04/07/2019   Procedure: Photocoagulation With Laser;  Surgeon: Rennis ChrisZamora, Arlie Posch, MD;  [facility];  Service: Ophthalmology;  Laterality: Right;    FAMILY HISTORY Family History  Problem Relation Age of Onset  . Stroke Father     SOCIAL HISTORY Social History   Tobacco Use  . Smoking status: Never Smoker  . Smokeless tobacco: Never Used  Substance Use Topics  . Alcohol use: No  . Drug use: No         OPHTHALMIC EXAM:  Base Eye Exam    Visual Acuity (Snellen - Linear)      Right Left   Dist cc 20/40 -2 20/30 -2   Dist ph cc 20/40 NI   Correction: Glasses       Tonometry (Tonopen, 8:26 AM)      Right Left   Pressure 13 13       Pupils      Dark Light Shape React APD   Right 3 2 Round Brisk None   Left 3 2 Round Brisk None       Visual Fields (Counting fingers)      Left Right    Full Full       Extraocular Movement      Right Left    Full, Ortho Full, Ortho       Neuro/Psych    Oriented x3: Yes   Mood/Affect: Normal       Dilation    Both eyes: 1.0% Mydriacyl, 2.5% Phenylephrine @ 8:26 AM        Slit Lamp and Fundus Exam    Slit Lamp Exam      Right Left   Lids/Lashes Dermatochalasis - upper lid, periorbital edema Dermatochalasis - upper lid   Conjunctiva/Sclera White and quiet White and quiet   Cornea Arcus, trace Punctate epithelial erosions, mild endo pigment Arcus, 1+ Punctate epithelial erosions, mildly tear film debris, mild endo pigment   Anterior Chamber Deep Deep and quiet   Iris Round and dilated Round and dilated   Lens PC IOL in good postion with anterior capsular  phimosis, 1+ Posterior capsular opacification PC IOL in good postion with anterior capsular phimosis, 1+ Posterior capsular opacification sparing center   Vitreous post vitrectomy Vitreous syneresis       Fundus Exam      Right Left   Disc Sharp rim, mild pallor, peripapillary drusen, PPP Pink and Sharp, +cupping   C/D Ratio 0.5 0.6   Macula flat; ERM/VMT gone, refractile drusen, cystic changes stably resolved, no heme Flat, Blunted foveal reflex, Drusen, early Atrophy,  RPE mottling and clumping, +PEDs, No heme   Vessels Vascular attenuation Vascular attenuation, mild Tortuousity   Periphery Attached, scattered peripheral drusen, laser changes superiorly Attached with peripheral drusen        Refraction    Wearing Rx      Sphere Cylinder Axis Add   Right -1.00 +0.75 180 +2.75   Left -0.50 +0.75 004 +2.75          IMAGING AND PROCEDURES  Imaging and Procedures for 02/09/18  OCT, Retina - OU - Both Eyes       Right Eye Quality was good. Central Foveal Thickness: 226. Progression has been stable. Findings include retinal drusen , outer retinal atrophy, intraretinal hyper-reflective material, abnormal foveal contour, pigment epithelial detachment, no SRF, no IRF (Stable resolution of focal IRF temporal macula; VMT stably resolved).   Left Eye Quality was good. Central Foveal Thickness: 288. Progression has been stable. Findings include vitreous traction, pigment epithelial detachment, abnormal foveal contour, no SRF, retinal drusen , epiretinal membrane, outer retinal atrophy, intraretinal fluid (Stable VMT; persistent cystic changes below VMT).   Notes Images taken, stored on drive  Diagnosis / Impression:  OD: Exudative ARMD; stable resolution of focal IRF temporal macula; VMT stably resolved OS: Nonexudative ARMD; Stable VMT; persistent cystic changes below VMT  Clinical management:  See below  Abbreviations: NFP - Normal foveal profile. CME - cystoid macular edema. PED  - pigment epithelial detachment. IRF - intraretinal fluid. SRF - subretinal fluid. EZ - ellipsoid zone. ERM - epiretinal membrane. ORA - outer retinal atrophy. ORT - outer retinal tubulation. SRHM - subretinal hyper-reflective material         Intravitreal Injection, Pharmacologic Agent - OD - Right Eye       Time Out 12/26/2019. 8:18 AM. Confirmed correct patient, procedure, site, and patient consented.   Anesthesia Topical anesthesia was used. Anesthetic medications included Lidocaine 2%, Proparacaine 0.5%.   Procedure Preparation included 5% betadine to ocular surface, eyelid speculum. A supplied needle was used.   Injection:  1.25 mg Bevacizumab (AVASTIN) SOLN   NDC: 78242-353-61, Lot: [id number] , Expiration date: 02/01/2020   Route: Intravitreal, Site: Right Eye, Waste: 0 mL  Post-op Post injection exam found visual acuity of at least counting fingers. The patient tolerated the procedure well. There were no complications. The patient received written and verbal post procedure care education.                 ASSESSMENT/PLAN:    ICD-10-CM   1. 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 Retinal edema  H35.81 OCT, Retina - OU - Both Eyes  5. Pseudophakia of both eyes  Z96.1   6. Vitreous syneresis of left eye  H43.392     1. 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    - S/P IVA OD #1 (12.03.18), #2 (01.02.19), #3 (01.30.19), #4 (03.01.19), #5 (04.01.19), #6 (04.30.19), #7 (06.05.19), #8 (07.19.19), #9 (08.30.19), #10 (10.14.19), #11 (11.18.19), #12 (12.30.19), #13 (02.10.20), #14 (04.06.20), #15 (09.17.20), #16  (10.15.20), #17 (12.01.20), #18 (01.18.21)  - has had good response to IVA  - FA (04.01.19) shows mild CNVM OD consistent with exudative ARMD  - OCT shows stable  resolution of focal IRF temporal macula  - BCVA stable at 20/40   - recommend IVA OD #19 today, 03.15.21  - pt wishes to proceed  - RBA of procedure discussed, questions answered  - informed consent obtained  - Avastin informed consent form signed and scanned on 01.18.2021  - see procedure note  - f/u 8 weeks -- DFE/OCT/possible injection -- tx and ext as able  2. Age related macular degeneration, non-exudative, OS  - The incidence, anatomy, and pathology of dry AMD, risk of progression, and the AREDS and AREDS 2 study including smoking risks discussed with patient.  - Continue amsler grid monitoring  - will continue to monitor for conversion to exudative ARMD  3,4. 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 Pseudophakia OU  - s/p CE/IOL OU  - s/p YAG OU  - beautiful surgeries, doing well  - monitor  6. Vitreous syneresis OS  - Pt with intermittent floaters OS  - No frank PVD on exam  - Discussed findings and prognosis  - No RT or RD on 360 peripheral exam  - Reviewed s/s of RT/RD  - Strict return precautions for any such RT/RD signs/symptoms   Ophthalmic Meds Ordered this visit:  Meds ordered this encounter  Medications  . Bevacizumab (AVASTIN) SOLN 1.25 mg       Return in about 8 weeks (around 02/20/2020) for f/u exu ARMD OD, DFE, OCT.  There are no Patient Instructions on file for this visit.   Explained the diagnoses, plan, and follow up with the patient and they  expressed understanding.  Patient expressed understanding of the importance of proper follow up care.   This document serves as a record of services personally performed by Karie Chimera, MD, PhD. It was created on their behalf by Herby Abraham, COA, a certified ophthalmic assistant. The creation of this record is the provider's dictation and/or activities during the visit.    Electronically signed by: Herby Abraham, COA @TODAY @ 8:59 AM   This document serves as a record of services personally performed by , MD, PhD. It was created on their behalf by Karie Chimera, OA, an ophthalmic assistant. The creation of this record is the provider's dictation and/or activities during the visit.    Electronically signed by: Laurian Brim, OA 03.15.2021 8:59 AM   03.17.2021, M.D., Ph.D. Diseases & Surgery of the Retina and Vitreous [facility]  I have reviewed the above documentation for accuracy and completeness, and I agree with the above. [facility], M.D., Ph.D. 12/26/19 8:59 AM    Abbreviations: M myopia (nearsighted); A astigmatism; H hyperopia (farsighted); P presbyopia; Mrx spectacle prescription;  CTL contact lenses; OD right eye; OS left eye; OU both eyes  XT exotropia; ET esotropia; PEK punctate epithelial keratitis; PEE punctate epithelial erosions; DES dry eye syndrome; MGD meibomian gland dysfunction; ATs artificial tears;  PFAT's preservative free artificial tears; NSC nuclear sclerotic cataract; PSC posterior subcapsular cataract; ERM epi-retinal membrane; PVD posterior vitreous detachment; RD retinal detachment; DM diabetes mellitus; DR diabetic retinopathy; NPDR non-proliferative diabetic retinopathy; PDR proliferative diabetic retinopathy; CSME clinically significant macular edema; DME diabetic macular edema; dbh dot blot hemorrhages; CWS cotton wool spot; POAG primary open angle glaucoma; C/D cup-to-disc ratio; HVF humphrey visual field; GVF  goldmann visual field; OCT optical coherence tomography; IOP intraocular pressure; BRVO Branch retinal vein occlusion; CRVO central retinal vein occlusion; CRAO central retinal artery occlusion; BRAO branch retinal artery occlusion; RT retinal tear; SB scleral buckle; PPV pars plana vitrectomy; VH Vitreous hemorrhage; PRP panretinal laser photocoagulation; IVK intravitreal kenalog; VMT vitreomacular traction; MH Macular hole;  NVD neovascularization of the disc; NVE neovascularization elsewhere; AREDS age related eye disease study; ARMD age related macular degeneration; POAG primary open angle glaucoma; EBMD epithelial/anterior basement membrane dystrophy; ACIOL anterior chamber intraocular lens; IOL intraocular lens; PCIOL posterior chamber intraocular lens; Phaco/IOL phacoemulsification with intraocular lens placement; PRK photorefractive keratectomy; LASIK laser assisted in situ keratomileusis; HTN hypertension; DM diabetes mellitus; COPD chronic obstructive pulmonary disease

## 2019-12-26 ENCOUNTER — Ambulatory Visit (INDEPENDENT_AMBULATORY_CARE_PROVIDER_SITE_OTHER): Payer: Medicare HMO | Admitting: Ophthalmology

## 2019-12-26 ENCOUNTER — Encounter (INDEPENDENT_AMBULATORY_CARE_PROVIDER_SITE_OTHER): Payer: Self-pay | Admitting: Ophthalmology

## 2019-12-26 DIAGNOSIS — H353211 Exudative age-related macular degeneration, right eye, with active choroidal neovascularization: Secondary | ICD-10-CM | POA: Diagnosis not present

## 2019-12-26 DIAGNOSIS — H353121 Nonexudative age-related macular degeneration, left eye, early dry stage: Secondary | ICD-10-CM

## 2019-12-26 DIAGNOSIS — H3581 Retinal edema: Secondary | ICD-10-CM | POA: Diagnosis not present

## 2019-12-26 DIAGNOSIS — H43392 Other vitreous opacities, left eye: Secondary | ICD-10-CM

## 2019-12-26 DIAGNOSIS — H43823 Vitreomacular adhesion, bilateral: Secondary | ICD-10-CM | POA: Diagnosis not present

## 2019-12-26 DIAGNOSIS — Z961 Presence of intraocular lens: Secondary | ICD-10-CM

## 2019-12-26 MED ORDER — BEVACIZUMAB CHEMO INJECTION 1.25MG/0.05ML SYRINGE FOR KALEIDOSCOPE
1.2500 mg | INTRAVITREAL | Status: AC | PRN
  Administered 2019-12-26: 1.25 mg via INTRAVITREAL

## 2020-02-20 NOTE — Progress Notes (Signed)
[facility] Note  02/21/2020     CHIEF COMPLAINT Patient presents for Retina Follow Up   HISTORY OF PRESENT ILLNESS: Lindsay Harvey is a 84 y.o. female who presents to the clinic today for:   HPI    Retina Follow Up    Patient presents with  Wet AMD.  In right eye.  This started 8 weeks ago.  Severity is moderate.  I, the attending physician,  performed the HPI with the patient and updated documentation appropriately.          Comments    Patient here for 8 weeks retina follow up for exu ARMD OD. Patient states vision doing good. No eye pain.        Last edited by [name], MD on 02/21/2020  8:07 AM. (History)    pt is doing well, no visual complaints   Referring physician: Jani Gravel, [address] [place],  [place] [address]  HISTORICAL INFORMATION:   Selected notes from the MEDICAL RECORD NUMBER Referred by Dr. [name] for concern of AMD OU, CNV    CURRENT MEDICATIONS: Current Outpatient Medications (Ophthalmic Drugs)  Medication Sig  . carboxymethylcellulose (REFRESH PLUS) 0.5 % SOLN Place 1 drop into both eyes 3 (three) times daily as needed (dry/irritated eyes).   . prednisoLONE acetate (PRED FORTE) 1 % ophthalmic suspension Place 1 drop into the right eye 4 (four) times daily. (Patient not taking: Reported on 12/26/2019)  . REFRESH OPTIVE MEGA-3 0.5-1-0.5 % SOLN Place 1 drop into both eyes 3 (three) times daily as needed (discomfort/eye irritation.).   No current facility-administered medications for this visit. (Ophthalmic Drugs)   Current Outpatient Medications (Other)  Medication Sig  . calcium carbonate (OSCAL) 1500 (600 Ca) MG TABS tablet Take 600 mg of elemental calcium by mouth 2 (two) times a day.  . Cholecalciferol (VITAMIN D3) 50 MCG (2000 UT) TABS Take 2,000 Units by mouth daily.  . Multiple Vitamins-Minerals (EMERGEN-C IMMUNE PO) Take 1 packet by mouth daily as needed (for immune support).  . Multiple  Vitamins-Minerals (PRESERVISION AREDS 2 PO) Take 1 tablet by mouth 2 (two) times a day.   . simvastatin (ZOCOR) 40 MG tablet Take 10 mg by mouth every evening. 1/4 tablet at night   Current Facility-Administered Medications (Other)  Medication Route  . Bevacizumab (AVASTIN) SOLN 1.25 mg Intravitreal  . Bevacizumab (AVASTIN) SOLN 1.25 mg Intravitreal  . Bevacizumab (AVASTIN) SOLN 1.25 mg Intravitreal  . Bevacizumab (AVASTIN) SOLN 1.25 mg Intravitreal  . Bevacizumab (AVASTIN) SOLN 1.25 mg Intravitreal  . Bevacizumab (AVASTIN) SOLN 1.25 mg Intravitreal  . Bevacizumab (AVASTIN) SOLN 1.25 mg Intravitreal  . Bevacizumab (AVASTIN) SOLN 1.25 mg Intravitreal  . Bevacizumab (AVASTIN) SOLN 1.25 mg Intravitreal  . Bevacizumab (AVASTIN) SOLN 1.25 mg Intravitreal  . Bevacizumab (AVASTIN) SOLN 1.25 mg Intravitreal  . Bevacizumab (AVASTIN) SOLN 1.25 mg Intravitreal  . Bevacizumab (AVASTIN) SOLN 1.25 mg Intravitreal      REVIEW OF SYSTEMS: ROS    Positive for: Gastrointestinal, Eyes   Negative for: Constitutional, Neurological, Skin, Genitourinary, Musculoskeletal, HENT, Endocrine, Cardiovascular, Respiratory, Psychiatric, Allergic/Imm, Heme/Lymph   Last edited by Theodore Demark, COA on 02/21/2020  7:57 AM. (History)       ALLERGIES No Known Allergies  PAST MEDICAL HISTORY Past Medical History:  Diagnosis Date  . Allergies   . Cataract 2013   OU  . 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  Headache   . Hypercholesterolemia   . Macular degeneration    Wet OD, Dry OS  . Wears glasses    Past Surgical History:  Procedure Laterality Date  . 25 GAUGE PARS PLANA VITRECTOMY WITH 20 GAUGE MVR PORT FOR MACULAR HOLE Right 04/07/2019   Procedure: 25 GAUGE PARS PLANA VITRECTOMY WITH 20 GAUGE MVR PORT FOR MACULAR HOLE;  Surgeon: Rennis Chris, MD;  Location: [facility] OR;  Service: Ophthalmology;  Laterality: Right;  . CATARACT EXTRACTION     2013  . DILATION AND  CURETTAGE OF UTERUS    . EYE SURGERY    . GAS/FLUID EXCHANGE Right 04/07/2019   Procedure: Gas/Fluid Exchange;  Surgeon: Rennis Chris, MD;  Location: [facility] OR;  Service: Ophthalmology;  Laterality: Right;  . MEMBRANE PEEL Right 04/07/2019   Procedure: Eula Flax;  Surgeon: Rennis Chris, MD;  Location: [facility] OR;  Service: Ophthalmology;  Laterality: Right;  . PHOTOCOAGULATION WITH LASER Right 04/07/2019   Procedure: Photocoagulation With Laser;  Surgeon: Rennis Chris, MD;  Location: [facility] OR;  Service: Ophthalmology;  Laterality: Right;    FAMILY HISTORY Family History  Problem Relation Age of Onset  . Stroke Father     SOCIAL HISTORY Social History   Tobacco Use  . Smoking status: Never Smoker  . Smokeless tobacco: Never Used  Substance Use Topics  . Alcohol use: No  . Drug use: No         OPHTHALMIC EXAM:  Base Eye Exam    Visual Acuity (Snellen - Linear)      Right Left   Dist cc 20/40 20/30 -2   Dist ph cc NI NI   Correction: Glasses       Tonometry (Tonopen, 7:54 AM)      Right Left   Pressure 09 12       Pupils      Dark Light Shape React APD   Right 3 2 Round Brisk None   Left 3 2 Round Brisk None       Visual Fields (Counting fingers)      Left Right    Full Full       Extraocular Movement      Right Left    Full, Ortho Full, Ortho       Neuro/Psych    Oriented x3: Yes   Mood/Affect: Normal       Dilation    Both eyes: 1.0% Mydriacyl, 2.5% Phenylephrine @ 7:54 AM        Slit Lamp and Fundus Exam    Slit Lamp Exam      Right Left   Lids/Lashes Dermatochalasis - upper lid, periorbital edema Dermatochalasis - upper lid   Conjunctiva/Sclera White and quiet White and quiet   Cornea Arcus, trace Punctate epithelial erosions, mild endo pigment Arcus, 1+ Punctate epithelial erosions, mildly tear film debris, mild endo pigment   Anterior Chamber Deep Deep and quiet   Iris Round and dilated Round and dilated   Lens PC IOL in good postion with  anterior capsular phimosis, 1+ Posterior capsular opacification PC IOL in good postion with anterior capsular phimosis, 1+ Posterior capsular opacification sparing center   Vitreous post vitrectomy Vitreous syneresis       Fundus Exam      Right Left   Disc Sharp rim, mild pallor, peripapillary drusen, PPP Pink and Sharp, +cupping   C/D Ratio 0.5 0.6   Macula flat; ERM/VMT gone, refractile drusen, cystic changes stably resolved, pigment clumping, no heme Flat, Blunted  foveal reflex, Drusen, early Atrophy, RPE mottling and clumping, +PEDs, No heme -- stable   Vessels Vascular attenuation Vascular attenuation, mild Tortuousity   Periphery Attached, scattered peripheral drusen, laser changes superiorly Attached with peripheral drusen        Refraction    Wearing Rx      Sphere Cylinder Axis Add   Right -1.00 +0.75 180 +2.75   Left -0.50 +0.75 004 +2.75          IMAGING AND PROCEDURES  Imaging and Procedures for 02/09/18  OCT, Retina - OU - Both Eyes       Right Eye Quality was good. Central Foveal Thickness: 227. Progression has been stable. Findings include retinal drusen , outer retinal atrophy, intraretinal hyper-reflective material, abnormal foveal contour, pigment epithelial detachment, no SRF, no IRF (Stable resolution of focal IRF temporal macula; VMT stably resolved).   Left Eye Quality was good. Central Foveal Thickness: 288. Progression has been stable. Findings include vitreous traction, pigment epithelial detachment, abnormal foveal contour, no SRF, retinal drusen , epiretinal membrane, outer retinal atrophy, intraretinal fluid (Stable VMT; persistent cystic changes below VMT).   Notes Images taken, stored on drive  Diagnosis / Impression:  OD: Exudative ARMD; stable resolution of focal IRF temporal macula; VMT stably resolved OS: Nonexudative ARMD; Stable VMT; persistent cystic changes below VMT  Clinical management:  See below  Abbreviations: NFP - Normal  foveal profile. CME - cystoid macular edema. PED - pigment epithelial detachment. IRF - intraretinal fluid. SRF - subretinal fluid. EZ - ellipsoid zone. ERM - epiretinal membrane. ORA - outer retinal atrophy. ORT - outer retinal tubulation. SRHM - subretinal hyper-reflective material         Intravitreal Injection, Pharmacologic Agent - OD - Right Eye       Time Out 02/21/2020. 8:04 AM. Confirmed correct patient, procedure, site, and patient consented.   Anesthesia Topical anesthesia was used. Anesthetic medications included Lidocaine 2%, Proparacaine 0.5%.   Procedure Preparation included 5% betadine to ocular surface, eyelid speculum. A supplied needle was used.   Injection:  1.25 mg Bevacizumab (AVASTIN) SOLN   NDC: [id number], Lot: [id number] , Expiration date: 04/25/2020   Route: Intravitreal, Site: Right Eye, Waste: 0 mL  Post-op Post injection exam found visual acuity of at least counting fingers. The patient tolerated the procedure well. There were no complications. The patient received written and verbal post procedure care education.                 ASSESSMENT/PLAN:    ICD-10-CM   1. 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 Retinal edema  H35.81 OCT, Retina - OU - Both Eyes  5. Pseudophakia of both eyes  Z96.1     1. 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    - S/P IVA OD #1 (12.03.18), #2 (01.02.19), #3 (01.30.19), #4 (03.01.19), #5 (04.01.19), #6 (04.30.19), #7 (06.05.19), #8 (07.19.19), #9 (08.30.19), #10 (10.14.19), #11 (11.18.19), #12 (12.30.19), #13 (02.10.20), #14 (04.06.20), #15 (09.17.20), #16  (10.15.20), #17 (12.01.20), #18 (01.18.21), #19 (03.15.21)  - has had good response to IVA  - FA (04.01.19) shows mild CNVM OD consistent with exudative ARMD  - OCT shows stable resolution  of focal IRF temporal macula  - BCVA stable at 20/40   - recommend IVA OD #20 today, 5.11.21 w/ extension to 10 wks  - pt wishes to proceed  - RBA of procedure discussed, questions answered  - informed consent obtained  - Avastin informed consent form signed and scanned on 01.18.2021  - see procedure note  - f/u 10 weeks -- DFE/OCT/possible injection -- tx and ext as able  2. Age related macular degeneration, non-exudative, OS  - The incidence, anatomy, and pathology of dry AMD, risk of progression, and the AREDS and AREDS 2 study including smoking risks discussed with patient.  - Continue amsler grid monitoring  - will continue to monitor for conversion to exudative ARMD  3,4. 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 Pseudophakia OU  - s/p CE/IOL OU  - s/p YAG OU  - beautiful surgeries, doing well  - monitor  Ophthalmic Meds Ordered this visit:  Meds ordered this encounter  Medications  . Bevacizumab (AVASTIN) SOLN 1.25 mg       Return in about 10 weeks (around 05/01/2020) for f/u exu ARMD OD, DFE, OCT.  There are no Patient Instructions on file for this visit.   Explained the diagnoses, plan, and follow up with the patient and they expressed understanding.  Patient expressed understanding of the importance of proper follow up care.   This document serves as a record of services personally performed by Karie Chimera, MD, PhD. It was created  on their behalf by Cristopher Estimable, COT an ophthalmic technician. The creation of this record is the provider's dictation and/or activities during the visit.    Electronically signed by: Cristopher Estimable, COT 02/20/20 @ 8:33 AM   This document serves as a record of services personally performed by Karie Chimera, MD, PhD. It was created on their behalf by Laurian Brim, OA, an ophthalmic assistant. The creation of this record is the provider's dictation and/or activities during the visit.    Electronically signed by: Laurian Brim, OA 05.11.2021 8:33 AM  Karie Chimera, M.D., Ph.D. Diseases & Surgery of the Retina and Vitreous [facility] 02/21/2020   I have reviewed the above documentation for accuracy and completeness, and I agree with the above. Karie Chimera, M.D., Ph.D. 02/21/20 8:33 AM   Abbreviations: M myopia (nearsighted); A astigmatism; H hyperopia (farsighted); P presbyopia; Mrx spectacle prescription;  CTL contact lenses; OD right eye; OS left eye; OU both eyes  XT exotropia; ET esotropia; PEK punctate epithelial keratitis; PEE punctate epithelial erosions; DES dry eye syndrome; MGD meibomian gland dysfunction; ATs artificial tears; PFAT's preservative free artificial tears; NSC nuclear sclerotic cataract; PSC posterior subcapsular cataract; ERM epi-retinal membrane; PVD posterior vitreous detachment; RD retinal detachment; DM diabetes mellitus; DR diabetic retinopathy; NPDR non-proliferative diabetic retinopathy; PDR proliferative diabetic retinopathy; CSME clinically significant macular edema; DME diabetic macular edema; dbh dot  blot hemorrhages; CWS cotton wool spot; POAG primary open angle glaucoma; C/D cup-to-disc ratio; HVF humphrey visual field; GVF goldmann visual field; OCT optical coherence tomography; IOP intraocular pressure; BRVO Branch retinal vein occlusion; CRVO central retinal vein occlusion; CRAO central retinal artery occlusion; BRAO branch retinal  artery occlusion; RT retinal tear; SB scleral buckle; PPV pars plana vitrectomy; VH Vitreous hemorrhage; PRP panretinal laser photocoagulation; IVK intravitreal kenalog; VMT vitreomacular traction; MH Macular hole;  NVD neovascularization of the disc; NVE neovascularization elsewhere; AREDS age related eye disease study; ARMD age related macular degeneration; POAG primary open angle glaucoma; EBMD epithelial/anterior basement membrane dystrophy; ACIOL anterior chamber intraocular lens; IOL intraocular lens; PCIOL posterior chamber intraocular lens; Phaco/IOL phacoemulsification with intraocular lens placement; PRK photorefractive keratectomy; LASIK laser assisted in situ keratomileusis; HTN hypertension; DM diabetes mellitus; COPD chronic obstructive pulmonary disease

## 2020-02-21 ENCOUNTER — Ambulatory Visit (INDEPENDENT_AMBULATORY_CARE_PROVIDER_SITE_OTHER): Payer: Medicare HMO | Admitting: Ophthalmology

## 2020-02-21 ENCOUNTER — Encounter (INDEPENDENT_AMBULATORY_CARE_PROVIDER_SITE_OTHER): Payer: Self-pay | Admitting: Ophthalmology

## 2020-02-21 ENCOUNTER — Other Ambulatory Visit: Payer: Self-pay

## 2020-02-21 DIAGNOSIS — H43823 Vitreomacular adhesion, bilateral: Secondary | ICD-10-CM | POA: Diagnosis not present

## 2020-02-21 DIAGNOSIS — H3581 Retinal edema: Secondary | ICD-10-CM

## 2020-02-21 DIAGNOSIS — H43393 Other vitreous opacities, bilateral: Secondary | ICD-10-CM

## 2020-02-21 DIAGNOSIS — H353211 Exudative age-related macular degeneration, right eye, with active choroidal neovascularization: Secondary | ICD-10-CM | POA: Diagnosis not present

## 2020-02-21 DIAGNOSIS — H353121 Nonexudative age-related macular degeneration, left eye, early dry stage: Secondary | ICD-10-CM | POA: Diagnosis not present

## 2020-02-21 DIAGNOSIS — H43392 Other vitreous opacities, left eye: Secondary | ICD-10-CM

## 2020-02-21 DIAGNOSIS — H43821 Vitreomacular adhesion, right eye: Secondary | ICD-10-CM

## 2020-02-21 DIAGNOSIS — Z961 Presence of intraocular lens: Secondary | ICD-10-CM

## 2020-02-21 MED ORDER — BEVACIZUMAB CHEMO INJECTION 1.25MG/0.05ML SYRINGE FOR KALEIDOSCOPE
1.2500 mg | INTRAVITREAL | Status: AC | PRN
  Administered 2020-02-21: 1.25 mg via INTRAVITREAL

## 2020-04-26 NOTE — Progress Notes (Signed)
[facility] - Clinic Note  05/01/2020     CHIEF COMPLAINT Patient presents for Retina Follow Up   HISTORY OF PRESENT ILLNESS: Lindsay Harvey is a 84 y.o. female who presents to the clinic today for:   HPI    Retina Follow Up    Patient presents with  Wet AMD.  In right eye.  Severity is moderate.  Duration of 10 weeks.  Since onset it is gradually improving.  I, the attending physician,  performed the HPI with the patient and updated documentation appropriately.          Comments    Pt feels like vision is better since she was here last, she states it is getting harder to read without glasses, her eyes feel very dry, but no pain, fol or floaters,        Last edited by Rennis ChrisZamora, Luvia Orzechowski, MD on 05/01/2020  9:03 AM. (History)    pt states vision is the stable, pt daughter states she still complains about not being able to see, daughter states [place] ago and she complained about seeing a dark circle in her vision, pt states that has cleared up now   Referring physician: Dimitri PedWeaver, Christopher D, MD [address] [place] [id number]  HISTORICAL INFORMATION:   Selected notes from the MEDICAL RECORD NUMBER Referred by Dr. Marcha Solders. [name] for concern of AMD OU, CNV    CURRENT MEDICATIONS: Current Outpatient Medications (Ophthalmic Drugs)  Medication Sig  . carboxymethylcellulose (REFRESH PLUS) 0.5 % SOLN Place 1 drop into both eyes 3 (three) times daily as needed (dry/irritated eyes).   . prednisoLONE acetate (PRED FORTE) 1 % ophthalmic suspension Place 1 drop into the right eye 4 (four) times daily. (Patient not taking: Reported on 12/26/2019)  . REFRESH OPTIVE MEGA-3 0.5-1-0.5 % SOLN Place 1 drop into both eyes 3 (three) times daily as needed (discomfort/eye irritation.).   No current facility-administered medications for this visit. (Ophthalmic Drugs)   Current Outpatient Medications (Other)  Medication Sig  . calcium  carbonate (OSCAL) 1500 (600 Ca) MG TABS tablet Take 600 mg of elemental calcium by mouth 2 (two) times a day.  . Cholecalciferol (VITAMIN D3) 50 MCG (2000 UT) TABS Take 2,000 Units by mouth daily.  . Multiple Vitamins-Minerals (EMERGEN-C IMMUNE PO) Take 1 packet by mouth daily as needed (for immune support).  . Multiple Vitamins-Minerals (PRESERVISION AREDS 2 PO) Take 1 tablet by mouth 2 (two) times a day.   . simvastatin (ZOCOR) 40 MG tablet Take 10 mg by mouth every evening. 1/4 tablet at night   Current Facility-Administered Medications (Other)  Medication Route  . Bevacizumab (AVASTIN) SOLN 1.25 mg Intravitreal  . Bevacizumab (AVASTIN) SOLN 1.25 mg Intravitreal  . Bevacizumab (AVASTIN) SOLN 1.25 mg Intravitreal  . Bevacizumab (AVASTIN) SOLN 1.25 mg Intravitreal  . Bevacizumab (AVASTIN) SOLN 1.25 mg Intravitreal  . Bevacizumab (AVASTIN) SOLN 1.25 mg Intravitreal  . Bevacizumab (AVASTIN) SOLN 1.25 mg Intravitreal  . Bevacizumab (AVASTIN) SOLN 1.25 mg Intravitreal  . Bevacizumab (AVASTIN) SOLN 1.25 mg Intravitreal  . Bevacizumab (AVASTIN) SOLN 1.25 mg Intravitreal  . Bevacizumab (AVASTIN) SOLN 1.25 mg Intravitreal  . Bevacizumab (AVASTIN) SOLN 1.25 mg Intravitreal  . Bevacizumab (AVASTIN) SOLN 1.25 mg Intravitreal      REVIEW OF SYSTEMS: ROS    Positive for: Eyes   Negative for: Constitutional, Gastrointestinal, Neurological, Skin, Genitourinary, Musculoskeletal, HENT, Endocrine, Cardiovascular, Respiratory, Psychiatric, Allergic/Imm, Heme/Lymph   Last  edited by Posey Boyer, COT on 05/01/2020  8:29 AM. (History)       ALLERGIES No Known Allergies  PAST MEDICAL HISTORY Past Medical History:  Diagnosis Date  . Allergies   . Cataract 2013   OU  . 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 Headache   . Hypercholesterolemia   . Macular degeneration    Wet OD, Dry OS  . Wears glasses    Past Surgical History:  Procedure Laterality Date  .  25 GAUGE PARS PLANA VITRECTOMY WITH 20 GAUGE MVR PORT FOR MACULAR HOLE Right 04/07/2019   Procedure: 25 GAUGE PARS PLANA VITRECTOMY WITH 20 GAUGE MVR PORT FOR MACULAR HOLE;  Surgeon: Rennis Chris, MD;  Location[facility] OR;  Service: Ophthalmology;  Laterality: Right;  . CATARACT EXTRACTION     2013  . DILATION AND CURETTAGE OF UTERUS    . EYE SURGERY    . GAS/FLUID EXCHANGE Right 04/07/2019   Procedure: Gas/Fluid Exchange;  Surgeon: Rennis Chris, MD;  Location: [facility] OR;  Service: Ophthalmology;  Laterality: Right;  . MEMBRANE PEEL Right 04/07/2019   Procedure: Eula Flax;  Surgeon: Rennis Chris, MD;  Location: [facility] OR;  Service: Ophthalmology;  Laterality: Right;  . PHOTOCOAGULATION WITH LASER Right 04/07/2019   Procedure: Photocoagulation With Laser;  Surgeon: Rennis Chris, MD;  Location: [facility] OR;  Service: Ophthalmology;  Laterality: Right;    FAMILY HISTORY Family History  Problem Relation Age of Onset  . Stroke Father     SOCIAL HISTORY Social History   Tobacco Use  . Smoking status: Never Smoker  . Smokeless tobacco: Never Used  Vaping Use  . Vaping Use: Never used  Substance Use Topics  . Alcohol use: No  . Drug use: No         OPHTHALMIC EXAM:  Base Eye Exam    Visual Acuity (Snellen - Linear)      Right Left   Dist cc 20/40 20/40 -2   Dist ph cc NI NI   Correction: Glasses       Tonometry (Tonopen, 8:38 AM)      Right Left   Pressure 12 19       Pupils      Dark Light Shape React APD   Right 3 2 Round Brisk None   Left 3 2 Round Brisk None       Visual Fields (Counting fingers)      Left Right    Full Full       Extraocular Movement      Right Left    Full, Ortho Full, Ortho       Neuro/Psych    Oriented x3: Yes   Mood/Affect: Normal       Dilation    Both eyes: 1.0% Mydriacyl, 2.5% Phenylephrine @ 8:38 AM        Slit Lamp and Fundus Exam    Slit Lamp Exam      Right Left   Lids/Lashes Dermatochalasis - upper lid, periorbital edema  Dermatochalasis - upper lid   Conjunctiva/Sclera White and quiet White and quiet   Cornea Arcus, trace Punctate epithelial erosions, mild endo pigment Arcus, 1+ Punctate epithelial erosions, mildly tear film debris, mild endo pigment   Anterior Chamber Deep Deep and quiet   Iris Round and dilated Round and dilated   Lens PC IOL in good postion with anterior capsular phimosis, 1+ Posterior capsular opacification PC IOL in good postion with  anterior capsular phimosis, 1+ Posterior capsular opacification sparing center   Vitreous post vitrectomy Vitreous syneresis       Fundus Exam      Right Left   Disc Sharp rim, mild pallor, peripapillary drusen, PPP Pink and Sharp, +cupping   C/D Ratio 0.5 0.6   Macula flat; ERM/VMT gone, blunted foveal reflex, refractile drusen, cystic changes stably resolved, pigment clumping, no heme or edema Blunted foveal reflex, Drusen, early Atrophy, RPE mottling and clumping, +PEDs/CNV, +heme temporal to fovea, +edema/IRF -- new from prior   Vessels Vascular attenuation Vascular attenuation, mild Tortuousity   Periphery Attached, scattered peripheral drusen, laser changes superiorly Attached with peripheral drusen          IMAGING AND PROCEDURES  Imaging and Procedures for 02/09/18  OCT, Retina - OU - Both Eyes       Right Eye Quality was good. Central Foveal Thickness: 227. Progression has been stable. Findings include retinal drusen , outer retinal atrophy, intraretinal hyper-reflective material, abnormal foveal contour, pigment epithelial detachment, no SRF, no IRF (Stable resolution of focal IRF temporal macula; VMT stably resolved).   Left Eye Quality was good. Central Foveal Thickness: 335. Progression has worsened. Findings include vitreous traction, pigment epithelial detachment, abnormal foveal contour, retinal drusen , epiretinal membrane, outer retinal atrophy, intraretinal fluid, no SRF (Interval conversion to exudative ARMD--interval increase in  IRF and PEDs inferiorly; persistent VMT).   Notes Images taken, stored on drive  Diagnosis / Impression:  OD: Exudative ARMD; stable resolution of focal IRF temporal macula; VMT stably resolved OS: Interval conversion to exudative ARMD--interval increase in IRF and PEDs inferiorly; persistent VMT  Clinical management:  See below  Abbreviations: NFP - Normal foveal profile. CME - cystoid macular edema. PED - pigment epithelial detachment. IRF - intraretinal fluid. SRF - subretinal fluid. EZ - ellipsoid zone. ERM - epiretinal membrane. ORA - outer retinal atrophy. ORT - outer retinal tubulation. SRHM - subretinal hyper-reflective material         Intravitreal Injection, Pharmacologic Agent - OD - Right Eye       Time Out 05/01/2020. 9:07 AM. Confirmed correct patient, procedure, site, and patient consented.   Anesthesia Topical anesthesia was used. Anesthetic medications included Lidocaine 2%, Proparacaine 0.5%.   Procedure Preparation included 5% betadine to ocular surface, eyelid speculum. A supplied needle was used.   Injection:  1.25 mg Bevacizumab (AVASTIN) SOLN   NDC: 86767-209-47, Lot: [id number], Expiration date: 06/13/2020   Route: Intravitreal, Site: Right Eye, Waste: 0 mg  Post-op Post injection exam found visual acuity of at least counting fingers. The patient tolerated the procedure well. There were no complications. The patient received written and verbal post procedure care education.        Intravitreal Injection, Pharmacologic Agent - OS - Left Eye       Time Out 05/01/2020. 9:07 AM. Confirmed correct patient, procedure, site, and patient consented.   Anesthesia Topical anesthesia was used. Anesthetic medications included Lidocaine 2%, Proparacaine 0.5%.   Procedure Preparation included eyelid speculum, 5% betadine to ocular surface. A supplied needle was used.   Injection:  1.25 mg Bevacizumab (AVASTIN) SOLN   NDC: 29476-546-50, Lot: [id number],  Expiration date: 06/06/2020   Route: Intravitreal, Site: Left Eye, Waste: 0 mg  Post-op Post injection exam found visual acuity of at least counting fingers. The patient tolerated the procedure well. There were no complications. The patient received written and verbal post procedure care education.  ASSESSMENT/PLAN:    ICD-10-CM   1. 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 Exudative age-related macular degeneration of left eye with active choroidal neovascularization (HCC)  H35.3221 Intravitreal Injection, Pharmacologic Agent - OS - Left Eye    Bevacizumab (AVASTIN) SOLN 1.25 mg  3. 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 Retinal edema  H35.81 OCT, Retina - OU - Both Eyes  5. Pseudophakia of both eyes  Z96.1     1. 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    - S/P IVA OD #1 (12.03.18), #2 (01.02.19), #3 (01.30.19), #4 (03.01.19), #5 (04.01.19), #6 (04.30.19), #7 (06.05.19), #8 (07.19.19), #9 (08.30.19), #10 (10.14.19), #11 (11.18.19), #12 (12.30.19), #13 (02.10.20), #14 (04.06.20), #15 (09.17.20), #16 (10.15.20), #17 (12.01.20), #18 (01.18.21), #19 (03.15.21), #20 (5.11.21)  - has had good response to IVA  - FA (04.01.19) shows mild CNVM OD consistent with exudative ARMD  - OCT shows stable resolution of focal IRF temporal macula at 10 wks  - BCVA stable at 20/40   - recommend IVA OD #21 today, 7.20.21 - maintenance  - pt wishes to proceed  - RBA of procedure discussed, questions answered  - informed consent obtained  - Avastin informed consent form signed and scanned on 01.18.2021  - see procedure note  - f/u 4 wks -- DFE/OCT/possible injection -- tx and ext as able  2. Age related macular degeneration, non-exudative, OS  - interval  conversion from non-exudative to exudative noted 07.20.21  - exam shows focal heme overlying PEDs temporal macula  - OCT with interval development of IRF overlying PEDs  - recommend IVA OS #1 today, 07.20.21  - pt wishes to proceed with injection  - RBA of procedure discussed, questions answered  - informed consent obtained, signed and scanned, 07.20.21  - see procedure note  - f/u 4 weeks, DFE, OCT, possible injection  3,4. 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 Pseudophakia OU  - s/p CE/IOL OU  - s/p YAG OU  - beautiful surgeries, doing well  - monitor  Ophthalmic Meds Ordered this visit:  Meds ordered this encounter  Medications  . Bevacizumab (AVASTIN) SOLN 1.25 mg  . Bevacizumab (AVASTIN) SOLN 1.25 mg       Return in about 4 weeks (around 05/29/2020) for f/u exu ARMD OU, DFE, OCT.  There are no Patient Instructions on file for this visit.   Explained the diagnoses, plan, and follow up with the patient and they expressed understanding.  Patient expressed understanding of the importance of proper follow up care.   This document serves as a record of services personally performed by Karie Chimera, MD, PhD. It was created on their behalf by Cristopher Estimable, COT an ophthalmic technician. The creation of this record  is the provider's dictation and/or activities during the visit.    Electronically signed by: Cristopher Estimable, COT 04/26/20 @ 1:44 PM  This document serves as a record of services personally performed by Karie Chimera, MD, PhD. It was created on their behalf by Glee Arvin. Manson Passey, OA an ophthalmic technician. The creation of this record  is the provider's dictation and/or activities during the visit.    Electronically signed by: Glee Arvin. Manson Passey, [place] 07.20.2021 1:44 PM  Karie Chimera, M.D., Ph.D. Diseases & Surgery of the Retina and Vitreous [facility] 05/01/2020   I have reviewed the above documentation for accuracy and completeness, and I agree with the above. Karie Chimera, M.D., Ph.D. 05/01/20 1:44 PM   Abbreviations: M myopia (nearsighted); A astigmatism; H hyperopia (farsighted); P presbyopia; Mrx spectacle prescription;  CTL contact lenses; OD right eye; OS left eye; OU both eyes  XT exotropia; ET esotropia; PEK punctate epithelial keratitis; PEE punctate epithelial erosions; DES dry eye syndrome; MGD meibomian gland dysfunction; ATs artificial tears; PFAT's preservative free artificial tears; NSC nuclear sclerotic cataract; PSC posterior subcapsular cataract; ERM epi-retinal membrane; PVD posterior vitreous detachment; RD retinal detachment; DM diabetes mellitus; DR diabetic retinopathy; NPDR non-proliferative diabetic retinopathy; PDR proliferative diabetic retinopathy; CSME clinically significant macular edema; DME diabetic macular edema; dbh dot blot hemorrhages; CWS cotton wool spot; POAG primary open angle glaucoma; C/D cup-to-disc ratio; HVF humphrey visual field; GVF goldmann visual field; OCT optical coherence tomography; IOP intraocular pressure; BRVO Branch retinal vein occlusion; CRVO central retinal vein occlusion; CRAO central retinal artery occlusion; BRAO branch retinal artery occlusion; RT retinal tear; SB scleral buckle; PPV pars plana vitrectomy; VH Vitreous hemorrhage; PRP panretinal laser photocoagulation; IVK intravitreal kenalog; VMT vitreomacular traction; MH Macular hole;  NVD neovascularization of the disc; NVE neovascularization elsewhere; AREDS age related eye disease study; ARMD age related macular degeneration; POAG primary open angle glaucoma; EBMD epithelial/anterior  basement membrane dystrophy; ACIOL anterior chamber intraocular lens; IOL intraocular lens; PCIOL posterior chamber intraocular lens; Phaco/IOL phacoemulsification with intraocular lens placement; PRK photorefractive keratectomy; LASIK laser assisted in situ keratomileusis; HTN hypertension; DM diabetes mellitus; COPD chronic obstructive pulmonary disease

## 2020-05-01 ENCOUNTER — Ambulatory Visit (INDEPENDENT_AMBULATORY_CARE_PROVIDER_SITE_OTHER): Payer: Medicare HMO | Admitting: Ophthalmology

## 2020-05-01 ENCOUNTER — Encounter (INDEPENDENT_AMBULATORY_CARE_PROVIDER_SITE_OTHER): Payer: Self-pay | Admitting: Ophthalmology

## 2020-05-01 ENCOUNTER — Other Ambulatory Visit: Payer: Self-pay

## 2020-05-01 DIAGNOSIS — H353221 Exudative age-related macular degeneration, left eye, with active choroidal neovascularization: Secondary | ICD-10-CM | POA: Diagnosis not present

## 2020-05-01 DIAGNOSIS — H3581 Retinal edema: Secondary | ICD-10-CM

## 2020-05-01 DIAGNOSIS — H353211 Exudative age-related macular degeneration, right eye, with active choroidal neovascularization: Secondary | ICD-10-CM

## 2020-05-01 DIAGNOSIS — H43823 Vitreomacular adhesion, bilateral: Secondary | ICD-10-CM | POA: Diagnosis not present

## 2020-05-01 DIAGNOSIS — Z961 Presence of intraocular lens: Secondary | ICD-10-CM

## 2020-05-01 MED ORDER — BEVACIZUMAB CHEMO INJECTION 1.25MG/0.05ML SYRINGE FOR KALEIDOSCOPE
1.2500 mg | INTRAVITREAL | Status: AC | PRN
  Administered 2020-05-01: 1.25 mg via INTRAVITREAL

## 2020-05-28 NOTE — Progress Notes (Signed)
[facility] - Clinic Note  05/31/2020     CHIEF COMPLAINT Patient presents for Retina Follow Up   HISTORY OF PRESENT ILLNESS: Lindsay Harvey is a 84 y.o. female who presents to the clinic today for:   HPI    Retina Follow Up    Patient presents with  Wet AMD.  In both eyes.  This started 4 weeks ago.  Severity is moderate.  I, the attending physician,  performed the HPI with the patient and updated documentation appropriately.          Comments    Patient here for 4 weeks retina follow up for exu ARMD OU. Patient states vision doing ok. No eye pain.       Last edited by Rennis Chris, MD on 05/31/2020  9:00 AM. (History)    pt states no change in vision  Referring physician: Dimitri Ped, MD [address] [place],  [place] [address]  HISTORICAL INFORMATION:   Selected notes from the MEDICAL RECORD NUMBER Referred by Dr. Marcha Solders for concern of AMD OU, CNV    CURRENT MEDICATIONS: Current Outpatient Medications (Ophthalmic Drugs)  Medication Sig   carboxymethylcellulose (REFRESH PLUS) 0.5 % SOLN Place 1 drop into both eyes 3 (three) times daily as needed (dry/irritated eyes).    prednisoLONE acetate (PRED FORTE) 1 % ophthalmic suspension Place 1 drop into the right eye 4 (four) times daily. (Patient not taking: Reported on 12/26/2019)   REFRESH OPTIVE MEGA-3 0.5-1-0.5 % SOLN Place 1 drop into both eyes 3 (three) times daily as needed (discomfort/eye irritation.).   No current facility-administered medications for this visit. (Ophthalmic Drugs)   Current Outpatient Medications (Other)  Medication Sig   calcium carbonate (OSCAL) 1500 (600 Ca) MG TABS tablet Take 600 mg of elemental calcium by mouth 2 (two) times a day.   Cholecalciferol (VITAMIN D3) 50 MCG (2000 UT) TABS Take 2,000 Units by mouth daily.   Multiple Vitamins-Minerals (EMERGEN-C IMMUNE PO) Take 1 packet by mouth daily as needed (for immune support).   Multiple  Vitamins-Minerals (PRESERVISION AREDS 2 PO) Take 1 tablet by mouth 2 (two) times a day.    simvastatin (ZOCOR) 40 MG tablet Take 10 mg by mouth every evening. 1/4 tablet at night   Current Facility-Administered Medications (Other)  Medication Route   Bevacizumab (AVASTIN) SOLN 1.25 mg Intravitreal   Bevacizumab (AVASTIN) SOLN 1.25 mg Intravitreal   Bevacizumab (AVASTIN) SOLN 1.25 mg Intravitreal   Bevacizumab (AVASTIN) SOLN 1.25 mg Intravitreal   Bevacizumab (AVASTIN) SOLN 1.25 mg Intravitreal   Bevacizumab (AVASTIN) SOLN 1.25 mg Intravitreal   Bevacizumab (AVASTIN) SOLN 1.25 mg Intravitreal   Bevacizumab (AVASTIN) SOLN 1.25 mg Intravitreal   Bevacizumab (AVASTIN) SOLN 1.25 mg Intravitreal   Bevacizumab (AVASTIN) SOLN 1.25 mg Intravitreal   Bevacizumab (AVASTIN) SOLN 1.25 mg Intravitreal   Bevacizumab (AVASTIN) SOLN 1.25 mg Intravitreal   Bevacizumab (AVASTIN) SOLN 1.25 mg Intravitreal      REVIEW OF SYSTEMS: ROS    Positive for: Gastrointestinal, Eyes   Negative for: Constitutional, Neurological, Skin, Genitourinary, Musculoskeletal, HENT, Endocrine, Cardiovascular, Respiratory, Psychiatric, Allergic/Imm, Heme/Lymph   Last edited by Laddie Aquas, COA on 05/31/2020  8:09 AM. 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  Headache    Hypercholesterolemia    Macular degeneration    Wet OD, Dry OS   Wears glasses    Past Surgical History:  Procedure Laterality Date   25 GAUGE PARS PLANA VITRECTOMY WITH 20 GAUGE MVR PORT FOR MACULAR HOLE Right 04/07/2019   Procedure: 25 GAUGE PARS PLANA VITRECTOMY WITH 20 GAUGE MVR PORT FOR MACULAR HOLE;  Surgeon: Rennis Chris, MD;  Location: [facility] OR;  Service: Ophthalmology;  Laterality: Right;   CATARACT EXTRACTION     2013   DILATION AND  CURETTAGE OF UTERUS     EYE SURGERY     GAS/FLUID EXCHANGE Right 04/07/2019   Procedure: Gas/Fluid Exchange;  Surgeon: Rennis Chris, MD;  Location: [facility] OR;  Service: Ophthalmology;  Laterality: Right;   MEMBRANE PEEL Right 04/07/2019   Procedure: Eula Flax;  Surgeon: Rennis Chris, MD;  Location: [facility] OR;  Service: Ophthalmology;  Laterality: Right;   PHOTOCOAGULATION WITH LASER Right 04/07/2019   Procedure: Photocoagulation With Laser;  Surgeon: Rennis Chris, MD;  Location: [facility] OR;  Service: Ophthalmology;  Laterality: Right;    FAMILY HISTORY Family History  Problem Relation Age of Onset   Stroke Father     SOCIAL HISTORY Social History   Tobacco Use   Smoking status: Never Smoker   Smokeless tobacco: Never Used  Vaping Use   Vaping Use: Never used  Substance Use Topics   Alcohol use: No   Drug use: No         OPHTHALMIC EXAM:  Base Eye Exam    Visual Acuity (Snellen - Linear)      Right Left   Dist cc 20/30 -1 20/40 +2   Dist ph cc NI NI   Correction: Glasses       Tonometry (Tonopen, 8:07 AM)      Right Left   Pressure 12 13       Pupils      Dark Light Shape React APD   Right 3 2 Round Brisk None   Left 3 2 Round Brisk None       Visual Fields (Counting fingers)      Left Right    Full Full       Extraocular Movement      Right Left    Full, Ortho Full, Ortho       Neuro/Psych    Oriented x3: Yes   Mood/Affect: Normal       Dilation    Both eyes: 1.0% Mydriacyl, 2.5% Phenylephrine @ 8:07 AM        Slit Lamp and Fundus Exam    Slit Lamp Exam      Right Left   Lids/Lashes Dermatochalasis - upper lid, periorbital edema Dermatochalasis - upper lid   Conjunctiva/Sclera White and quiet White and quiet   Cornea Arcus, trace Punctate epithelial erosions, mild endo pigment Arcus, 1+ Punctate epithelial erosions, mildly tear film debris, mild endo pigment   Anterior Chamber Deep Deep and quiet   Iris Round and dilated Round and  dilated   Lens PC IOL in good postion with anterior capsular phimosis, 1+ Posterior capsular opacification PC IOL in good postion with anterior capsular phimosis, 1+ Posterior capsular opacification sparing center   Vitreous post vitrectomy Vitreous syneresis       Fundus Exam      Right Left   Disc Sharp rim, mild pallor, peripapillary drusen, PPP Pink and Sharp, +cupping   C/D Ratio 0.5 0.6   Macula flat; ERM/VMT gone, blunted foveal  reflex, refractile drusen, cystic changes stably resolved, pigment clumping, no heme or edema Blunted foveal reflex, Drusen, early Atrophy, RPE mottling and clumping, +PEDs/CNV, +heme temporal to fovea -- improved, +edema/IRF -- improved from prior   Vessels Vascular attenuation Vascular attenuation, mild Tortuousity   Periphery Attached, scattered peripheral drusen, laser changes superiorly Attached with peripheral drusen        Refraction    Wearing Rx      Sphere Cylinder Axis Add   Right -1.00 +0.75 180 +2.75   Left -0.50 +0.75 004 +2.75          IMAGING AND PROCEDURES  Imaging and Procedures for 02/09/18  OCT, Retina - OU - Both Eyes       Right Eye Quality was good. Central Foveal Thickness: 221. Progression has been stable. Findings include retinal drusen , outer retinal atrophy, intraretinal hyper-reflective material, abnormal foveal contour, pigment epithelial detachment, no SRF, no IRF (Stable resolution of focal IRF temporal macula; VMT stably resolved).   Left Eye Quality was good. Central Foveal Thickness: 269. Progression has improved. Findings include vitreous traction, pigment epithelial detachment, abnormal foveal contour, retinal drusen , epiretinal membrane, outer retinal atrophy, no SRF, no IRF (exudative ARMD--interval improvement in IRF and PEDs; persistent VMT).   Notes Images taken, stored on drive  Diagnosis / Impression:  OD: Exudative ARMD; stable resolution of focal IRF temporal macula; VMT stably resolved OS:  exudative ARMD--interval improvement in IRF and PEDs; persistent VMT  Clinical management:  See below  Abbreviations: NFP - Normal foveal profile. CME - cystoid macular edema. PED - pigment epithelial detachment. IRF - intraretinal fluid. SRF - subretinal fluid. EZ - ellipsoid zone. ERM - epiretinal membrane. ORA - outer retinal atrophy. ORT - outer retinal tubulation. SRHM - subretinal hyper-reflective material         Intravitreal Injection, Pharmacologic Agent - OS - Left Eye       Time Out 05/31/2020. 8:30 AM. Confirmed correct patient, procedure, site, and patient consented.   Anesthesia Topical anesthesia was used. Anesthetic medications included Lidocaine 2%, Proparacaine 0.5%.   Procedure Preparation included eyelid speculum, 5% betadine to ocular surface. A (32g) needle was used.   Injection:  1.25 mg Bevacizumab (AVASTIN) SOLN   NDC: 70360-001-02, Lot: [id number], Expiration date: 06/25/2020   Route: Intravitreal, Site: Left Eye, Waste: 0.05 mg  Post-op Post injection exam found visual acuity of at least counting fingers. The patient tolerated the procedure well. There were no complications. The patient received written and verbal post procedure care education.                 ASSESSMENT/PLAN:    ICD-10-CM   1. Exudative age-related macular degeneration of right eye with active choroidal neovascularization (HCC)  H35.3211 CANCELED: Intravitreal Injection, Pharmacologic Agent - OD - Right Eye  2. Exudative age-related macular degeneration of left eye with active choroidal neovascularization (HCC)  H35.3221 Intravitreal Injection, Pharmacologic Agent - OS - Left Eye    Bevacizumab (AVASTIN) SOLN 1.25 mg  3. 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 Retinal edema  H35.81 OCT, Retina - OU - Both Eyes  5. Pseudophakia of both eyes  Z96.1     1. 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    - S/P IVA OD #1  (12.03.18), #2 (01.02.19), #3 (01.30.19), #4 (03.01.19), #5 (04.01.19), #6 (04.30.19), #7 (06.05.19), #8 (07.19.19), #9 (08.30.19), #10 (10.14.19), #11 (11.18.19), #12 (12.30.19), #13 (02.10.20), #14 (04.06.20), #15 (09.17.20), #16 (  10.15.20), #17 (12.01.20), #18 (01.18.21), #19 (03.15.21), #20 (5.11.21), #21 (07.20.21)  - has had good response to IVA  - FA (04.01.19) shows mild CNVM OD consistent with exudative ARMD  - OCT shows stable resolution of focal IRF temporal macula at 10 wks  - BCVA slightly improved to 20/30 from 20/40   - Avastin informed consent form signed and scanned on 01.18.2021  - today only 4 wks from last injection (7.20.21)  - f/u 4 wks -- DFE/OCT/possible injection -- tx and ext as able  2. Age related macular degeneration, exudative, OS  - interval conversion from non-exudative to exudative noted 07.20.21  - s/p IVA OS #1 (07.20.21)  - exam shows focal heme overlying PEDs temporal macula improved  - OCT with interval improvement of IRF and PED  - BCVA stable at 20/40  - recommend IVA OS #2 today, 08.19.21  - pt wishes to proceed with injection  - RBA of procedure discussed, questions answered  - informed consent obtained, signed and scanned, 07.20.21  - see procedure note  - f/u 4 weeks, DFE, OCT, possible injection  3,4. 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 Pseudophakia OU  - s/p CE/IOL OU  - s/p YAG OU  - beautiful surgeries, doing well  - monitor  Ophthalmic Meds Ordered this visit:  Meds ordered this encounter  Medications   Bevacizumab (AVASTIN) SOLN 1.25 mg       Return in about 4 weeks (around 06/28/2020) for  f/u exu ARMD OU, DFE, OCT.  There are no Patient Instructions on file for this visit.   Explained the diagnoses, plan, and follow up with the patient and they expressed understanding.  Patient expressed understanding of the importance of proper follow up care.   This document serves as a record of services personally performed by Karie Chimera, MD, PhD. It was created on their behalf by Joni Reining, an ophthalmic technician. The creation of this record is the provider's dictation and/or activities during the visit.    Electronically signed by: Joni Reining COA, 05/31/20  9:27 AM   This document serves as a record of services personally performed by Karie Chimera, MD, PhD. It was created on their behalf by Glee Arvin. Manson Passey, OA an ophthalmic technician. The creation of this record is the provider's dictation and/or activities during the visit.    Electronically signed by: Glee Arvin. Manson Passey, [place] 08.19.2021 9:27 AM   Karie Chimera, M.D., Ph.D. Diseases & Surgery of the Retina and Vitreous [facility]  I have reviewed the above documentation for accuracy and completeness, and I agree with the above. Karie Chimera, M.D., Ph.D. 05/31/20 9:27 AM   Abbreviations: M myopia (nearsighted); A astigmatism; H hyperopia (farsighted); P presbyopia; Mrx spectacle prescription;  CTL contact lenses; OD right eye; OS left eye; OU both eyes  XT exotropia; ET esotropia; PEK punctate epithelial keratitis; PEE punctate epithelial  erosions; DES dry eye syndrome; MGD meibomian gland dysfunction; ATs artificial tears; PFAT's preservative free artificial tears; NSC nuclear sclerotic cataract; PSC posterior subcapsular cataract; ERM epi-retinal membrane; PVD posterior vitreous detachment; RD retinal detachment; DM diabetes mellitus; DR diabetic retinopathy; NPDR non-proliferative diabetic retinopathy; PDR proliferative diabetic retinopathy; CSME clinically significant macular edema; DME  diabetic macular edema; dbh dot blot hemorrhages; CWS cotton wool spot; POAG primary open angle glaucoma; C/D cup-to-disc ratio; HVF humphrey visual field; GVF goldmann visual field; OCT optical coherence tomography; IOP intraocular pressure; BRVO Branch retinal vein occlusion; CRVO central retinal vein occlusion; CRAO central retinal artery occlusion; BRAO branch retinal artery occlusion; RT retinal tear; SB scleral buckle; PPV pars plana vitrectomy; VH Vitreous hemorrhage; PRP panretinal laser photocoagulation; IVK intravitreal kenalog; VMT vitreomacular traction; MH Macular hole;  NVD neovascularization of the disc; NVE neovascularization elsewhere; AREDS age related eye disease study; ARMD age related macular degeneration; POAG primary open angle glaucoma; EBMD epithelial/anterior basement membrane dystrophy; ACIOL anterior chamber intraocular lens; IOL intraocular lens; PCIOL posterior chamber intraocular lens; Phaco/IOL phacoemulsification with intraocular lens placement; PRK photorefractive keratectomy; LASIK laser assisted in situ keratomileusis; HTN hypertension; DM diabetes mellitus; COPD chronic obstructive pulmonary disease

## 2020-05-31 ENCOUNTER — Encounter (INDEPENDENT_AMBULATORY_CARE_PROVIDER_SITE_OTHER): Payer: Self-pay | Admitting: Ophthalmology

## 2020-05-31 ENCOUNTER — Ambulatory Visit (INDEPENDENT_AMBULATORY_CARE_PROVIDER_SITE_OTHER): Payer: Medicare HMO | Admitting: Ophthalmology

## 2020-05-31 ENCOUNTER — Other Ambulatory Visit: Payer: Self-pay

## 2020-05-31 DIAGNOSIS — Z961 Presence of intraocular lens: Secondary | ICD-10-CM

## 2020-05-31 DIAGNOSIS — H353221 Exudative age-related macular degeneration, left eye, with active choroidal neovascularization: Secondary | ICD-10-CM

## 2020-05-31 DIAGNOSIS — H353211 Exudative age-related macular degeneration, right eye, with active choroidal neovascularization: Secondary | ICD-10-CM

## 2020-05-31 DIAGNOSIS — H3581 Retinal edema: Secondary | ICD-10-CM

## 2020-05-31 DIAGNOSIS — H43823 Vitreomacular adhesion, bilateral: Secondary | ICD-10-CM

## 2020-05-31 MED ORDER — BEVACIZUMAB CHEMO INJECTION 1.25MG/0.05ML SYRINGE FOR KALEIDOSCOPE
1.2500 mg | INTRAVITREAL | Status: AC | PRN
  Administered 2020-05-31: 1.25 mg via INTRAVITREAL

## 2020-06-27 NOTE — Progress Notes (Signed)
[facility] - Clinic Note  06/28/2020     CHIEF COMPLAINT Patient presents for Retina Follow Up   HISTORY OF PRESENT ILLNESS: Lindsay Harvey is a 84 y.o. female who presents to the clinic today for:   HPI    Retina Follow Up    Patient presents with  Wet AMD.  In both eyes.  This started months ago.  Severity is moderate.  Duration of 4 weeks.  Since onset it is stable.  I, the attending physician,  performed the HPI with the patient and updated documentation appropriately.          Comments    84 y/o female pt here for 4 wk f/u for exu ARMD OU.  No change in [place] OU.  Denies pain, FOL, floaters.  Uses gtts, but not sure what kind.  Interpreter not present this visit.       Last edited by Rennis Chris, MD on 06/28/2020  8:51 AM. (History)    pt states no change in vision, no eye pain  Referring physician: Dimitri Ped, MD [address] [place],  [place] [address]  HISTORICAL INFORMATION:   Selected notes from the MEDICAL RECORD NUMBER Referred by Dr. Marcha Solders for concern of AMD OU, CNV    CURRENT MEDICATIONS: Current Outpatient Medications (Ophthalmic Drugs)  Medication Sig  . carboxymethylcellulose (REFRESH PLUS) 0.5 % SOLN Place 1 drop into both eyes 3 (three) times daily as needed (dry/irritated eyes).   . prednisoLONE acetate (PRED FORTE) 1 % ophthalmic suspension Place 1 drop into the right eye 4 (four) times daily. (Patient not taking: Reported on 12/26/2019)  . REFRESH OPTIVE MEGA-3 0.5-1-0.5 % SOLN Place 1 drop into both eyes 3 (three) times daily as needed (discomfort/eye irritation.).   No current facility-administered medications for this visit. (Ophthalmic Drugs)   Current Outpatient Medications (Other)  Medication Sig  . calcium carbonate (OSCAL) 1500 (600 Ca) MG TABS tablet Take 600 mg of elemental calcium by mouth 2 (two) times a day.  . Cholecalciferol (VITAMIN D3) 50 MCG (2000 UT) TABS Take 2,000 Units by mouth daily.  .  Multiple Vitamins-Minerals (EMERGEN-C IMMUNE PO) Take 1 packet by mouth daily as needed (for immune support).  . Multiple Vitamins-Minerals (PRESERVISION AREDS 2 PO) Take 1 tablet by mouth 2 (two) times a day.   . simvastatin (ZOCOR) 40 MG tablet Take 10 mg by mouth every evening. 1/4 tablet at night   Current Facility-Administered Medications (Other)  Medication Route  . Bevacizumab (AVASTIN) SOLN 1.25 mg Intravitreal  . Bevacizumab (AVASTIN) SOLN 1.25 mg Intravitreal  . Bevacizumab (AVASTIN) SOLN 1.25 mg Intravitreal  . Bevacizumab (AVASTIN) SOLN 1.25 mg Intravitreal  . Bevacizumab (AVASTIN) SOLN 1.25 mg Intravitreal  . Bevacizumab (AVASTIN) SOLN 1.25 mg Intravitreal  . Bevacizumab (AVASTIN) SOLN 1.25 mg Intravitreal  . Bevacizumab (AVASTIN) SOLN 1.25 mg Intravitreal  . Bevacizumab (AVASTIN) SOLN 1.25 mg Intravitreal  . Bevacizumab (AVASTIN) SOLN 1.25 mg Intravitreal  . Bevacizumab (AVASTIN) SOLN 1.25 mg Intravitreal  . Bevacizumab (AVASTIN) SOLN 1.25 mg Intravitreal  . Bevacizumab (AVASTIN) SOLN 1.25 mg Intravitreal      REVIEW OF SYSTEMS: ROS    Positive for: Gastrointestinal, Eyes   Negative for: Constitutional, Neurological, Skin, Genitourinary, Musculoskeletal, HENT, Endocrine, Cardiovascular, Respiratory, Psychiatric, Allergic/Imm, Heme/Lymph   Last edited by [name], COA on 06/28/2020  7:57 AM. (History)       ALLERGIES No Known Allergies  PAST MEDICAL HISTORY Past Medical History:  Diagnosis Date  . Allergies   . 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 Headache   . Hypercholesterolemia   . Macular degeneration    Wet OU  . Wears glasses    Past Surgical History:  Procedure Laterality Date  . 25 GAUGE PARS PLANA VITRECTOMY WITH 20 GAUGE MVR PORT FOR MACULAR HOLE Right 04/07/2019   Procedure: 25 GAUGE PARS PLANA VITRECTOMY WITH 20 GAUGE MVR PORT FOR MACULAR HOLE;  Surgeon: Rennis Chris, MD;  Location: [facility] OR;  Service:  Ophthalmology;  Laterality: Right;  . CATARACT EXTRACTION Bilateral    2013  . DILATION AND CURETTAGE OF UTERUS    . EYE SURGERY Bilateral 2013   Cat Sx  . GAS/FLUID EXCHANGE Right 04/07/2019   Procedure: Gas/Fluid Exchange;  Surgeon: Rennis Chris, MD;  Location[facility] OR;  Service: Ophthalmology;  Laterality: Right;  . MEMBRANE PEEL Right 04/07/2019   Procedure: [place]ula Flax;  Surgeon: Rennis Chris, MD;  Location: [facility] OR;  Service: Ophthalmology;  Laterality: Right;  . PHOTOCOAGULATION WITH LASER Right 04/07/2019   Procedure: Photocoagulation With Laser;  Surgeon: Rennis Chris, MD;  Location: [facility] OR;  Service: Ophthalmology;  Laterality: Right;  . YAG LASER APPLICATION Bilateral     FAMILY HISTORY Family History  Problem Relation Age of Onset  . Stroke Father     SOCIAL HISTORY Social History   Tobacco Use  . Smoking status: Never Smoker  . Smokeless tobacco: Never Used  Vaping Use  . Vaping Use: Never used  Substance Use Topics  . Alcohol use: No  . Drug use: No         OPHTHALMIC EXAM:  Base Eye Exam    Visual Acuity (Snellen - Linear)      Right Left   Dist cc 20/30 -2 20/40 -   Dist ph cc NI NI   Correction: Glasses       Tonometry (Tonopen, 8:00 AM)      Right Left   Pressure 12 13       Pupils      Dark Light Shape React APD   Right 3 2 Round Brisk None   Left 3 2 Round Brisk None       Visual Fields (Counting fingers)      Left Right    Full Full       Extraocular Movement      Right Left    Full, Ortho Full, Ortho       Neuro/Psych    Oriented x3: Yes   Mood/Affect: Normal       Dilation    Both eyes: 1.0% Mydriacyl, 2.5% Phenylephrine @ 8:00 AM        Slit Lamp and Fundus Exam    Slit Lamp Exam      Right Left   Lids/Lashes Dermatochalasis - upper lid, periorbital edema Dermatochalasis - upper lid   Conjunctiva/Sclera White and quiet White and quiet   Cornea Arcus, trace Punctate epithelial erosions, mild endo pigment Arcus, 1+  Punctate epithelial erosions, mildly tear film debris, mild endo pigment   Anterior Chamber Deep Deep and quiet   Iris Round and dilated Round and dilated   Lens PC IOL in good postion with anterior capsular phimosis, 1+ Posterior capsular opacification PC IOL in good postion with anterior capsular phimosis, 1+ Posterior capsular opacification sparing center   Vitreous post vitrectomy Vitreous syneresis       Fundus Exam  Right Left   Disc Sharp rim, mild pallor, peripapillary drusen, PPP Pink and Sharp, +cupping   C/D Ratio 0.5 0.6   Macula flat; ERM/VMT gone, blunted foveal reflex, refractile drusen, cystic changes stably resolved, pigment clumping, no heme or edema Blunted foveal reflex, Drusen, early Atrophy, RPE mottling and clumping, +PEDs/CNV, +heme temporal to fovea -- improved, +edema/IRF -- mildly persistent   Vessels Vascular attenuation Vascular attenuation, mild Tortuousity   Periphery Attached, scattered peripheral drusen, laser changes superiorly Attached with peripheral drusen          IMAGING AND PROCEDURES  Imaging and Procedures for 02/09/18  OCT, Retina - OU - Both Eyes       Right Eye Quality was good. Central Foveal Thickness: 229. Progression has been stable. Findings include retinal drusen , outer retinal atrophy, intraretinal hyper-reflective material, abnormal foveal contour, pigment epithelial detachment, no SRF, no IRF (Stable resolution of focal IRF temporal macula; VMT stably resolved).   Left Eye Quality was good. Central Foveal Thickness: 277. Progression has improved. Findings include vitreous traction, pigment epithelial detachment, abnormal foveal contour, retinal drusen , epiretinal membrane, outer retinal atrophy, no SRF, intraretinal fluid (exudative ARMD--mild interval increase in IRF/cystic changes; central PEDs improved; persistent VMT).   Notes Images taken, stored on drive  Diagnosis / Impression:  OD: Exudative ARMD; stable  resolution of focal IRF temporal macula; VMT stably resolved OS: exudative ARMD--mild interval increase in IRF/cystic changes; central PEDs improved; persistent VMT  Clinical management:  See below  Abbreviations: NFP - Normal foveal profile. CME - cystoid macular edema. PED - pigment epithelial detachment. IRF - intraretinal fluid. SRF - subretinal fluid. EZ - ellipsoid zone. ERM - epiretinal membrane. ORA - outer retinal atrophy. ORT - outer retinal tubulation. SRHM - subretinal hyper-reflective material         Intravitreal Injection, Pharmacologic Agent - OS - Left Eye       Time Out 06/28/2020. 8:20 AM. Confirmed correct patient, procedure, site, and patient consented.   Anesthesia Topical anesthesia was used. Anesthetic medications included Lidocaine 2%, Proparacaine 0.5%.   Procedure Preparation included eyelid speculum, 5% betadine to ocular surface. A (32g) needle was used.   Injection:  1.25 mg Bevacizumab (AVASTIN) SOLN   NDC: 30076-226-33, Lot: [id number], Expiration date: 08/09/2020   Route: Intravitreal, Site: Left Eye, Waste: 0.05 mL  Post-op Post injection exam found visual acuity of at least counting fingers. The patient tolerated the procedure well. There were no complications. The patient received written and verbal post procedure care education.                 ASSESSMENT/PLAN:    ICD-10-CM   1. Exudative age-related macular degeneration of right eye with active choroidal neovascularization (HCC)  H35.3211   2. Exudative age-related macular degeneration of left eye with active choroidal neovascularization (HCC)  H35.3221 Intravitreal Injection, Pharmacologic Agent - OS - Left Eye    Bevacizumab (AVASTIN) SOLN 1.25 mg  3. 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 Retinal edema  H35.81 OCT, Retina - OU - Both Eyes  5. Pseudophakia of both eyes  Z96.1     1. 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     - S/P IVA OD #1 (12.03.18), #2 (01.02.19), #3 (01.30.19), #4 (03.01.19), #5 (04.01.19), #6 (04.30.19), #7 (06.05.19), #8 (07.19.19), #9 (08.30.19), #10 (10.14.19), #11 (11.18.19), #12 (12.30.19), #13 (02.10.20), #14 (04.06.20), #15 (09.17.20), #16 (10.15.20), #17 (12.01.20), #18 (01.18.21), #19 (03.15.21), #20 (5.11.21), #21 (  07.20.21)  - has had good response to IVA -- has been stable on 10 wk interval  - FA (04.01.19) shows mild CNVM OD consistent with exudative ARMD  - OCT shows stable resolution of focal IRF temporal macula at 8 wks today  - BCVA slightly improved to 20/30 from 20/40   - recommend holding IVA OD again today -- pt in agreement  - Avastin informed consent form signed and scanned on 01.18.2021  - f/u 4 wks -- DFE/OCT/possible injection -- tx and ext as able  2. Age related macular degeneration, exudative, OS  - interval conversion from non-exudative to exudative noted 07.20.21  - s/p IVA OS #1 (07.20.21), #2 (08.19.21)  - exam shows focal heme overlying PEDs temporal macula improved  - OCT with interval improvement in PED  - BCVA stable at 20/40  - recommend IVA OS #3 today, 09.16.21  - pt wishes to proceed with injection  - RBA of procedure discussed, questions answered  - informed consent obtained, signed and scanned, 07.20.21  - see procedure note  - f/u 4 weeks, DFE, OCT, possible injection  3,4. 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 Pseudophakia OU  - s/p CE/IOL OU  - s/p YAG OU  - beautiful surgeries, doing well  - monitor  Ophthalmic Meds Ordered this visit:  Meds ordered this encounter  Medications  . Bevacizumab  (AVASTIN) SOLN 1.25 mg       Return in about 4 weeks (around 07/26/2020) for f/u exu ARMD OU, DFE, OCT.  There are no Patient Instructions on file for this visit.   Explained the diagnoses, plan, and follow up with the patient and they expressed understanding.  Patient expressed understanding of the importance of proper follow up care.   This document serves as a record of services personally performed by Karie Chimera, MD, PhD. It was created on their behalf by Joni Reining, an ophthalmic technician. The creation of this record is the provider's dictation and/or activities during the visit.    Electronically signed by: Joni Reining COA, 06/28/20  8:55 AM   This document serves as a record of services personally performed by Karie Chimera, MD, PhD. It was created on their behalf by Glee Arvin. Manson Passey, OA an ophthalmic technician. The creation of this record is the provider's dictation and/or activities during the visit.    Electronically signed by: Glee Arvin. Manson Passey, [place] 09.16.2021 8:55 AM  Karie Chimera, M.D., Ph.D. Diseases & Surgery of the Retina and Vitreous [facility]  I have reviewed the above documentation for accuracy and completeness, and I agree with the above. Karie Chimera, M.D., Ph.D. 06/28/20 8:55 AM   Abbreviations: M myopia (nearsighted); A astigmatism; H hyperopia (farsighted); P presbyopia; Mrx spectacle prescription;  CTL contact lenses; OD right eye; OS left eye; OU both eyes  XT exotropia; ET esotropia; PEK punctate epithelial keratitis; PEE punctate epithelial  erosions; DES dry eye syndrome; MGD meibomian gland dysfunction; ATs artificial tears; PFAT's preservative free artificial tears; NSC nuclear sclerotic cataract; PSC posterior subcapsular cataract; ERM epi-retinal membrane; PVD posterior vitreous detachment; RD retinal detachment; DM diabetes mellitus; DR diabetic retinopathy; NPDR non-proliferative diabetic retinopathy; PDR  proliferative diabetic retinopathy; CSME clinically significant macular edema; DME diabetic macular edema; dbh dot blot hemorrhages; CWS cotton wool spot; POAG primary open angle glaucoma; C/D cup-to-disc ratio; HVF humphrey visual field; GVF goldmann visual field; OCT optical coherence tomography; IOP intraocular pressure; BRVO Branch retinal vein occlusion; CRVO central retinal vein occlusion; CRAO central retinal artery occlusion; BRAO branch retinal artery occlusion; RT retinal tear; SB scleral buckle; PPV pars plana vitrectomy; VH Vitreous hemorrhage; PRP panretinal laser photocoagulation; IVK intravitreal kenalog; VMT vitreomacular traction; MH Macular hole;  NVD neovascularization of the disc; NVE neovascularization elsewhere; AREDS age related eye disease study; ARMD age related macular degeneration; POAG primary open angle glaucoma; EBMD epithelial/anterior basement membrane dystrophy; ACIOL anterior chamber intraocular lens; IOL intraocular lens; PCIOL posterior chamber intraocular lens; Phaco/IOL phacoemulsification with intraocular lens placement; PRK photorefractive keratectomy; LASIK laser assisted in situ keratomileusis; HTN hypertension; DM diabetes mellitus; COPD chronic obstructive pulmonary disease

## 2020-06-28 ENCOUNTER — Ambulatory Visit (INDEPENDENT_AMBULATORY_CARE_PROVIDER_SITE_OTHER): Payer: Medicare HMO | Admitting: Ophthalmology

## 2020-06-28 ENCOUNTER — Encounter (INDEPENDENT_AMBULATORY_CARE_PROVIDER_SITE_OTHER): Payer: Self-pay | Admitting: Ophthalmology

## 2020-06-28 ENCOUNTER — Other Ambulatory Visit: Payer: Self-pay

## 2020-06-28 DIAGNOSIS — H43823 Vitreomacular adhesion, bilateral: Secondary | ICD-10-CM

## 2020-06-28 DIAGNOSIS — H3581 Retinal edema: Secondary | ICD-10-CM | POA: Diagnosis not present

## 2020-06-28 DIAGNOSIS — H353211 Exudative age-related macular degeneration, right eye, with active choroidal neovascularization: Secondary | ICD-10-CM

## 2020-06-28 DIAGNOSIS — H353221 Exudative age-related macular degeneration, left eye, with active choroidal neovascularization: Secondary | ICD-10-CM | POA: Diagnosis not present

## 2020-06-28 DIAGNOSIS — Z961 Presence of intraocular lens: Secondary | ICD-10-CM

## 2020-06-28 MED ORDER — BEVACIZUMAB CHEMO INJECTION 1.25MG/0.05ML SYRINGE FOR KALEIDOSCOPE
1.2500 mg | INTRAVITREAL | Status: AC | PRN
  Administered 2020-06-28: 1.25 mg via INTRAVITREAL

## 2020-07-24 NOTE — Progress Notes (Signed)
[facility] - Clinic Note  07/26/2020     CHIEF COMPLAINT Patient presents for Retina Follow Up   HISTORY OF PRESENT ILLNESS: Lindsay Harvey is a 84 y.o. female who presents to the clinic today for:   HPI    Retina Follow Up    Patient presents with  Wet AMD.  In right eye.  This started months ago.  Severity is moderate.  Duration of 4 weeks.  Since onset it is stable.  I, the attending physician,  performed the HPI with the patient and updated documentation appropriately.          Comments    84 y/o female pt here for 4 wk f/u for exu ARMD OD.  No change in [place] OU.  Denies pain, fOL, but c/o intermittent irritation OD and floaters OU when she looks down.  AT prn OU.       Last edited by Rennis Chris, MD on 07/26/2020 10:17 AM. (History)    pt states vision is stable, she is using AT's for dryness  Referring physician: Pearson Grippe, MD [address] [place],  [place] [address]  HISTORICAL INFORMATION:   Selected notes from the MEDICAL RECORD NUMBER Referred by Dr. Marcha Solders for concern of AMD OU, CNV    CURRENT MEDICATIONS: Current Outpatient Medications (Ophthalmic Drugs)  Medication Sig   carboxymethylcellulose (REFRESH PLUS) 0.5 % SOLN Place 1 drop into both eyes 3 (three) times daily as needed (dry/irritated eyes).    REFRESH OPTIVE MEGA-3 0.5-1-0.5 % SOLN Place 1 drop into both eyes 3 (three) times daily as needed (discomfort/eye irritation.).   prednisoLONE acetate (PRED FORTE) 1 % ophthalmic suspension Place 1 drop into the right eye 4 (four) times daily. (Patient not taking: Reported on 07/26/2020)   No current facility-administered medications for this visit. (Ophthalmic Drugs)   Current Outpatient Medications (Other)  Medication Sig   calcium carbonate (OSCAL) 1500 (600 Ca) MG TABS tablet Take 600 mg of elemental calcium by mouth 2 (two) times a day.   Cholecalciferol (VITAMIN D3) 50 MCG (2000 UT) TABS Take 2,000 Units by  mouth daily.   famotidine (PEPCID) 20 MG tablet Take 20 mg by mouth daily.   Multiple Vitamins-Minerals (EMERGEN-C IMMUNE PO) Take 1 packet by mouth daily as needed (for immune support).   Multiple Vitamins-Minerals (PRESERVISION AREDS 2 PO) Take 1 tablet by mouth 2 (two) times a day.    simvastatin (ZOCOR) 40 MG tablet Take 10 mg by mouth every evening. 1/4 tablet at night   Current Facility-Administered Medications (Other)  Medication Route   Bevacizumab (AVASTIN) SOLN 1.25 mg Intravitreal   Bevacizumab (AVASTIN) SOLN 1.25 mg Intravitreal   Bevacizumab (AVASTIN) SOLN 1.25 mg Intravitreal   Bevacizumab (AVASTIN) SOLN 1.25 mg Intravitreal   Bevacizumab (AVASTIN) SOLN 1.25 mg Intravitreal   Bevacizumab (AVASTIN) SOLN 1.25 mg Intravitreal   Bevacizumab (AVASTIN) SOLN 1.25 mg Intravitreal   Bevacizumab (AVASTIN) SOLN 1.25 mg Intravitreal   Bevacizumab (AVASTIN) SOLN 1.25 mg Intravitreal   Bevacizumab (AVASTIN) SOLN 1.25 mg Intravitreal   Bevacizumab (AVASTIN) SOLN 1.25 mg Intravitreal   Bevacizumab (AVASTIN) SOLN 1.25 mg Intravitreal   Bevacizumab (AVASTIN) SOLN 1.25 mg Intravitreal      REVIEW OF SYSTEMS: ROS    Positive for: Gastrointestinal, Eyes   Negative for: Constitutional, Neurological, Skin, Genitourinary, Musculoskeletal, HENT, Endocrine, Cardiovascular, Respiratory, Psychiatric, Allergic/Imm, Heme/Lymph   Last edited by [name], COA on 07/26/2020  7:48 AM. 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   Headache    Hypercholesterolemia    Macular degeneration    Wet OU   Wears glasses    Past Surgical History:  Procedure Laterality Date   25 GAUGE PARS PLANA VITRECTOMY WITH 20 GAUGE MVR PORT FOR MACULAR HOLE Right 04/07/2019   Procedure: 25 GAUGE PARS PLANA VITRECTOMY WITH 20 GAUGE MVR  PORT FOR MACULAR HOLE;  Surgeon: Rennis Chris, MD;  Location: [facility] OR;  Service: Ophthalmology;  Laterality: Right;   CATARACT EXTRACTION Bilateral    2013   DILATION AND CURETTAGE OF UTERUS     EYE SURGERY Bilateral 2013   Cat Sx   GAS/FLUID EXCHANGE Right 04/07/2019   Procedure: Gas/Fluid Exchange;  Surgeon: Rennis Chris, MD;  Location: [facility] OR;  Service: Ophthalmology;  Laterality: Right;   MEMBRANE PEEL Right 04/07/2019   Procedure: Eula Flax;  Surgeon: Rennis Chris, MD;  Location: [facility] OR;  Service: Ophthalmology;  Laterality: Right;   PHOTOCOAGULATION WITH LASER Right 04/07/2019   Procedure: Photocoagulation With Laser;  Surgeon: Rennis Chris, MD;  [facility] OR;  Service: Ophthalmology;  Laterality: Right;   YAG LASER APPLICATION Bilateral     FAMILY HISTORY Family History  Problem Relation Age of Onset   Stroke Father     SOCIAL HISTORY Social History   Tobacco Use   Smoking status: Never Smoker   Smokeless tobacco: Never Used  Vaping Use   Vaping Use: Never used  Substance Use Topics   Alcohol use: No   Drug use: No         OPHTHALMIC EXAM:  Base Eye Exam    Visual Acuity (Snellen - Linear)      Right Left   Dist cc 20/40 -2 20/30 -2   Dist ph cc NI NI   Correction: Glasses       Tonometry (Tonopen, 7:51 AM)      Right Left   Pressure 12 12       Pupils      Dark Light Shape React APD   Right 3 2 Round Brisk None   Left 3 2 Round Brisk None       Visual Fields (Counting fingers)      Left Right     Full       Extraocular Movement      Right Left    Full, Ortho Full, Ortho       Neuro/Psych    Oriented x3: Yes   Mood/Affect: Normal       Dilation    Both eyes: 1.0% Mydriacyl, 2.5% Phenylephrine @ 7:51 AM        Slit Lamp and Fundus Exam    Slit Lamp Exam      Right Left   Lids/Lashes Dermatochalasis - upper lid, periorbital edema Dermatochalasis - upper lid   Conjunctiva/Sclera White and quiet White and quiet    Cornea Arcus, trace Punctate epithelial erosions, mild endo pigment Arcus, 1+ Punctate epithelial erosions, mildly tear film debris, mild endo pigment   Anterior Chamber Deep Deep and quiet   Iris Round and dilated Round and dilated   Lens PC IOL in good postion with anterior capsular phimosis, 1+ Posterior capsular opacification PC IOL in good postion with anterior capsular phimosis, 1+ Posterior capsular opacification sparing center   Vitreous post vitrectomy Vitreous  syneresis       Fundus Exam      Right Left   Disc Sharp rim, mild pallor, peripapillary drusen, PPP Pink and Sharp, +cupping   C/D Ratio 0.5 0.6   Macula flat; ERM/VMT gone, blunted foveal reflex, refractile drusen, trace cystic changes, pigment clumping, no heme or edema Blunted foveal reflex, Drusen, early Atrophy, RPE mottling and clumping, +PEDs/CNV, +heme temporal to fovea -- resolved, +edema/IRF -- improved   Vessels Vascular attenuation Vascular attenuation, mild Tortuousity   Periphery Attached, scattered peripheral drusen, laser changes superiorly Attached with peripheral drusen          IMAGING AND PROCEDURES  Imaging and Procedures for 02/09/18  OCT, Retina - OU - Both Eyes       Right Eye Quality was good. Central Foveal Thickness: 229. Progression has worsened. Findings include retinal drusen , outer retinal atrophy, intraretinal hyper-reflective material, abnormal foveal contour, pigment epithelial detachment, no SRF, no IRF (Mild cystic changes temporal macula).   Left Eye Quality was good. Central Foveal Thickness: 288. Progression has improved. Findings include vitreous traction, pigment epithelial detachment, abnormal foveal contour, retinal drusen , epiretinal membrane, outer retinal atrophy, no SRF, intraretinal fluid (exudative ARMD--mild improvement in cystic changes; stable PEDs improved; persistent VMT).   Notes Images taken, stored on drive  Diagnosis / Impression:  OD: Exudative ARMD;  Mild cystic changes temporal macula ; VMT stably resolved OS: exudative ARMD--mild improvement in IRF/cystic changes; central PEDs improved; persistent VMT  Clinical management:  See below  Abbreviations: NFP - Normal foveal profile. CME - cystoid macular edema. PED - pigment epithelial detachment. IRF - intraretinal fluid. SRF - subretinal fluid. EZ - ellipsoid zone. ERM - epiretinal membrane. ORA - outer retinal atrophy. ORT - outer retinal tubulation. SRHM - subretinal hyper-reflective material         Intravitreal Injection, Pharmacologic Agent - OD - Right Eye       Time Out 07/26/2020. 8:18 AM. Confirmed correct patient, procedure, site, and patient consented.   Anesthesia Topical anesthesia was used. Anesthetic medications included Lidocaine 2%, Proparacaine 0.5%.   Procedure Preparation included 5% betadine to ocular surface, eyelid speculum. A supplied needle was used.   Injection:  1.25 mg Bevacizumab (AVASTIN) SOLN   NDC: 16109-604-5450242-060-01, Lot: [id number] , Expiration date: 08/30/2020   Route: Intravitreal, Site: Right Eye, Waste: 0 mg  Post-op Post injection exam found visual acuity of at least counting fingers. The patient tolerated the procedure well. There were no complications. The patient received written and verbal post procedure care education.        Intravitreal Injection, Pharmacologic Agent - OS - Left Eye       Time Out 07/26/2020. 8:19 AM. Confirmed correct patient, procedure, site, and patient consented.   Anesthesia Topical anesthesia was used. Anesthetic medications included Lidocaine 2%, Proparacaine 0.5%.   Procedure Preparation included eyelid speculum, 5% betadine to ocular surface. A supplied needle was used.   Injection:  1.25 mg Bevacizumab (AVASTIN) SOLN   NDC: 62376-283-1550242-060-01, Lot: [id number] , Expiration date: 08/21/2020   Route: Intravitreal, Site: Left Eye, Waste: 0 mg  Post-op Post injection exam found visual acuity of at least  counting fingers. The patient tolerated the procedure well. There were no complications. The patient received written and verbal post procedure care education.                 ASSESSMENT/PLAN:    ICD-10-CM   1. Exudative age-related macular degeneration of right eye with active choroidal neovascularization (HCC)  K93.2671 Intravitreal Injection, Pharmacologic Agent - OD - Right Eye    Bevacizumab (AVASTIN) SOLN 1.25 mg  2. Exudative age-related macular degeneration of left eye with active choroidal neovascularization (HCC)  H35.3221 Intravitreal Injection, Pharmacologic Agent - OS - Left Eye    Bevacizumab (AVASTIN) SOLN 1.25 mg  3. 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 Retinal edema  H35.81 OCT, Retina - OU - Both Eyes  5. Pseudophakia of both eyes  Z96.1     1. 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    - S/P IVA OD #1 (12.03.18), #2 (01.02.19), #3 (01.30.19), #4 (03.01.19), #5 (04.01.19), #6 (04.30.19), #7 (06.05.19), #8 (07.19.19), #9 (08.30.19), #10 (10.14.19), #11 (11.18.19), #12 (12.30.19), #13 (02.10.20), #14 (04.06.20), #15 (09.17.20), #16 (10.15.20), #17 (12.01.20), #18 (01.18.21), #19 (03.15.21), #20 (5.11.21), #21 (07.20.21)  - has had good response to IVA -- has been stable on 3 month interval -- will continue to treat q10months  - FA (04.01.19) shows mild CNVM OD consistent with exudative ARMD  - OCT shows mild recurrent cystic changes temporal macula OD  - BCVA slightly decreased to 20/40 from 20/30  - recommend IVE OD #22 today, 10.14.21 w/ repeat injxn in 12 wks  - pt wishes to proceed  - RBA of procedure discussed, questions answered  - informed consent obtained and signed  - see procedure note  - Avastin informed consent form signed and scanned on 01.18.2021  - f/u 6 wks -- DFE/OCT/possible injection   2. Age related macular degeneration, exudative, OS  - interval conversion from non-exudative to  exudative noted 07.20.21  - s/p IVA OS #1 (07.20.21), #2 (08.19.21), #3 (09.16.21)  - exam shows focal heme overlying PEDs temporal macula stably resolved  - OCT with interval improvement in IRF; stable PED  - BCVA improved to 20/30 from 20/40  - recommend IVA OS #4 today, 10.14.21 w/ extension to 6 wks  - pt wishes to proceed with injection  - RBA of procedure discussed, questions answered  - informed consent obtained, signed and scanned, 07.20.21  - see procedure note  - f/u 6 weeks, DFE, OCT, possible injection tx and ext as able  3,4. 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 Pseudophakia OU  - s/p CE/IOL OU  - s/p YAG OU  - beautiful surgeries, doing well  - monitor  Ophthalmic Meds Ordered this visit:  Meds ordered this encounter  Medications   Bevacizumab (AVASTIN) SOLN 1.25 mg   Bevacizumab (AVASTIN) SOLN 1.25 mg       Return in about 6 weeks (around 09/06/2020) for f/u exu ARMD OU, DFE, OCT.  There are no Patient Instructions on file for this visit.   Explained the diagnoses, plan, and follow up with the patient and they expressed understanding.  Patient expressed understanding of the importance of proper follow up care.   This document serves as a record of  services personally performed by Karie Chimera, MD, PhD. It was created on their behalf by Joni Reining, an ophthalmic technician. The creation of this record is the provider's dictation and/or activities during the visit.    Electronically signed by: Joni Reining COA, 07/26/20  10:08 PM   This document serves as a record of services personally performed by Karie Chimera,  MD, PhD. It was created on their behalf by Glee Arvin. Manson Passey, OA an ophthalmic technician. The creation of this record is the provider's dictation and/or activities during the visit.    Electronically signed by: Glee Arvin. [place], [place] 10.14.2021 10:08 PM   Karie Chimera, M.D., Ph.D. Diseases & Surgery of the Retina and Vitreous [facility]  I have reviewed the above documentation for accuracy and completeness, and I agree with the above. Karie Chimera, M.D., Ph.D. 07/26/20 10:08 PM   Abbreviations: M myopia (nearsighted); A astigmatism; H hyperopia (farsighted); P presbyopia; Mrx spectacle prescription;  CTL contact lenses; OD right eye; OS left eye; OU both eyes  XT exotropia; ET esotropia; PEK punctate epithelial keratitis; PEE punctate epithelial erosions; DES dry eye syndrome; MGD meibomian gland dysfunction; ATs artificial tears; PFAT's preservative free artificial tears; NSC nuclear sclerotic cataract; PSC posterior subcapsular cataract; ERM epi-retinal membrane; PVD posterior vitreous detachment; RD retinal detachment; DM diabetes mellitus; DR diabetic retinopathy; NPDR non-proliferative diabetic retinopathy; PDR proliferative diabetic retinopathy; CSME clinically significant macular edema; DME diabetic macular edema; dbh dot blot hemorrhages; CWS cotton wool spot; POAG primary open angle glaucoma; C/D cup-to-disc ratio; HVF humphrey visual field; GVF goldmann visual field; OCT optical coherence tomography; IOP intraocular pressure; BRVO Branch retinal vein occlusion; CRVO central retinal vein occlusion; CRAO central retinal artery occlusion; BRAO branch retinal artery occlusion; RT retinal tear; SB scleral buckle; PPV pars plana vitrectomy; VH Vitreous hemorrhage; PRP panretinal laser photocoagulation; IVK intravitreal kenalog; VMT vitreomacular traction; MH Macular hole;  NVD neovascularization of the disc; NVE neovascularization elsewhere; AREDS age related eye disease  study; ARMD age related macular degeneration; POAG primary open angle glaucoma; EBMD epithelial/anterior basement membrane dystrophy; ACIOL anterior chamber intraocular lens; IOL intraocular lens; PCIOL posterior chamber intraocular lens; Phaco/IOL phacoemulsification with intraocular lens placement; PRK photorefractive keratectomy; LASIK laser assisted in situ keratomileusis; HTN hypertension; DM diabetes mellitus; COPD chronic obstructive pulmonary disease

## 2020-07-26 ENCOUNTER — Encounter (INDEPENDENT_AMBULATORY_CARE_PROVIDER_SITE_OTHER): Payer: Self-pay | Admitting: Ophthalmology

## 2020-07-26 ENCOUNTER — Other Ambulatory Visit: Payer: Self-pay

## 2020-07-26 ENCOUNTER — Ambulatory Visit (INDEPENDENT_AMBULATORY_CARE_PROVIDER_SITE_OTHER): Payer: Medicare HMO | Admitting: Ophthalmology

## 2020-07-26 DIAGNOSIS — H3581 Retinal edema: Secondary | ICD-10-CM

## 2020-07-26 DIAGNOSIS — H43823 Vitreomacular adhesion, bilateral: Secondary | ICD-10-CM

## 2020-07-26 DIAGNOSIS — H353211 Exudative age-related macular degeneration, right eye, with active choroidal neovascularization: Secondary | ICD-10-CM

## 2020-07-26 DIAGNOSIS — Z961 Presence of intraocular lens: Secondary | ICD-10-CM

## 2020-07-26 DIAGNOSIS — H353221 Exudative age-related macular degeneration, left eye, with active choroidal neovascularization: Secondary | ICD-10-CM | POA: Diagnosis not present

## 2020-07-26 MED ORDER — BEVACIZUMAB CHEMO INJECTION 1.25MG/0.05ML SYRINGE FOR KALEIDOSCOPE
1.2500 mg | INTRAVITREAL | Status: AC | PRN
  Administered 2020-07-26: 1.25 mg via INTRAVITREAL

## 2020-08-09 ENCOUNTER — Other Ambulatory Visit: Payer: Self-pay | Admitting: Internal Medicine

## 2020-08-09 DIAGNOSIS — G8929 Other chronic pain: Secondary | ICD-10-CM

## 2020-08-09 DIAGNOSIS — D649 Anemia, unspecified: Secondary | ICD-10-CM

## 2020-08-14 ENCOUNTER — Ambulatory Visit (INDEPENDENT_AMBULATORY_CARE_PROVIDER_SITE_OTHER): Payer: Medicare HMO

## 2020-08-14 ENCOUNTER — Other Ambulatory Visit: Payer: Self-pay

## 2020-08-14 ENCOUNTER — Encounter: Payer: Self-pay | Admitting: Orthopaedic Surgery

## 2020-08-14 ENCOUNTER — Ambulatory Visit (INDEPENDENT_AMBULATORY_CARE_PROVIDER_SITE_OTHER): Payer: Medicare HMO | Admitting: Orthopaedic Surgery

## 2020-08-14 VITALS — Ht 62.0 in | Wt 120.4 lb

## 2020-08-14 DIAGNOSIS — G8929 Other chronic pain: Secondary | ICD-10-CM

## 2020-08-14 DIAGNOSIS — M5441 Lumbago with sciatica, right side: Secondary | ICD-10-CM | POA: Diagnosis not present

## 2020-08-14 MED ORDER — METHYLPREDNISOLONE 4 MG PO TBPK: ORAL_TABLET | ORAL | 0 refills | Status: DC

## 2020-08-14 NOTE — Progress Notes (Signed)
Office Visit Note   Patient: Lindsay Harvey           Date of Birth: [birth year]           MRN: [id number] Visit Date: 08/14/2020              Requested by: Lindsay Grippe, MD [address] [place],  [place] [address] PCP: Lindsay Grippe, MD   Assessment & Plan: Visit Diagnoses:  1. Chronic right-sided low back pain with right-sided sciatica     Plan: Impression is right leg piriformis syndrome.  I explained the condition to the patient and her daughter.  I recommend outpatient physical therapy but she will ask her nephew who is a physical therapist on recommendations for home exercises.  I have sent in a prescription for Medrol Dosepak.  Follow-up as needed.  Follow-Up Instructions: Return if symptoms worsen or fail to improve.   Orders:  Orders Placed This Encounter  Procedures  . XR Lumbar Spine 2-3 Views   Meds ordered this encounter  Medications  . methylPREDNISolone (MEDROL DOSEPAK) 4 MG TBPK tablet    Sig: Use as directed    Dispense:  21 tablet    Refill:  0      Procedures: No procedures performed   Clinical Data: No additional findings.   Subjective: Chief Complaint  Patient presents with  . Lower Back - Pain    Lindsay Harvey is a 84 year old Lindsay Harvey female brought in by her daughter who is providing language translation.  She has had pain off and on for the last 10 years without any injuries.  She has leg pain that radiates down to the ankle and it starts in the posterior lateral buttock region.  Currently not taking anything for pain.  Denies any groin pain.   Review of Systems  Constitutional: Negative.   HENT: Negative.   Eyes: Negative.   Respiratory: Negative.   Cardiovascular: Negative.   Endocrine: Negative.   Musculoskeletal: Negative.   Neurological: Negative.   Hematological: Negative.   Psychiatric/Behavioral: Negative.   All other systems reviewed and are negative.    Objective: Vital Signs: Ht 5\' 2"  (1.575 m)   Wt 120 lb 6.4 oz  (54.6 kg)   BMI 22.02 kg/m   Physical Exam Vitals and nursing note reviewed.  Constitutional:      Appearance: She is well-developed.  HENT:     Head: Normocephalic and atraumatic.  Pulmonary:     Effort: Pulmonary effort is normal.  Abdominal:     Palpations: Abdomen is soft.  Musculoskeletal:     Cervical back: Neck supple.  Skin:    General: Skin is warm.     Capillary Refill: Capillary refill takes less than 2 seconds.  Neurological:     Mental Status: She is alert and oriented to person, place, and time.  Psychiatric:        Behavior: Behavior normal.        Thought Content: Thought content normal.        Judgment: Judgment normal.     Ortho Exam Hip exam is unremarkable to range of motion.  She localizes the pain to the posterior lateral trochanter and hip region near the insertion of the external rotators.  Negative sciatic tension signs.  She has good motor and sensory function.  Normal reflexes.  Lumbar spine is slightly tender to palpation. Specialty Comments:  No specialty comments available.  Imaging: XR Lumbar Spine 2-3 Views  Result Date: 08/14/2020 Lumbar kyphosis.  Multilevel degenerative disc disease with anterior vertebral body spurring    PMFS History: Patient Active Problem List   Diagnosis Date Noted  . 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 HYPERLIPIDEMIA 03/01/2007  . GERD 03/01/2007  . ABORTION 01/22/2007   Past Medical History:  Diagnosis Date  . Allergies   . 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 Headache   . Hypercholesterolemia   . Macular degeneration    Wet OU  . Wears glasses     Family History  Problem Relation Age of Onset  . Stroke Father     Past Surgical History:  Procedure Laterality Date  . 25 GAUGE PARS PLANA VITRECTOMY WITH 20 GAUGE MVR PORT FOR MACULAR HOLE Right 04/07/2019   Procedure: 25 GAUGE PARS PLANA VITRECTOMY WITH 20 GAUGE  MVR PORT FOR MACULAR HOLE;  Surgeon: Rennis Chris, MD;  Location: [facility] OR;  Service: Ophthalmology;  Laterality: Right;  . CATARACT EXTRACTION Bilateral    2013  . DILATION AND CURETTAGE OF UTERUS    . EYE SURGERY Bilateral 2013   Cat Sx  . GAS/FLUID EXCHANGE Right 04/07/2019   Procedure: Gas/Fluid Exchange;  Surgeon: Rennis Chris, MD;  Location: [facility] OR;  Service: Ophthalmology;  Laterality: Right;  . MEMBRANE PEEL Right 04/07/2019   Procedure: Eula Flax;  Surgeon: Rennis Chris, MD;  Location[facility] OR;  Service: Ophthalmology;  Laterality: Right;  . PHOTOCOAGULATION WITH LASER Right 04/07/2019   Procedure: Photocoagulation With Laser;  Surgeon: Rennis Chris, MD;  Location: [facility] OR;  Service: Ophthalmology;  Laterality: Right;  . YAG LASER APPLICATION Bilateral    Social History   Occupational History  . Not on file  Tobacco Use  . Smoking status: Never Smoker  . Smokeless tobacco: Never Used  Vaping Use  . Vaping Use: Never used  Substance and Sexual Activity  . Alcohol use: No  . Drug use: No  . Sexual activity: Not on file

## 2020-08-21 ENCOUNTER — Ambulatory Visit
Admission: RE | Admit: 2020-08-21 | Discharge: 2020-08-21 | Disposition: A | Discharge status: Home / Self Care | Payer: Medicare HMO | Source: Ambulatory Visit | Attending: Internal Medicine | Admitting: Internal Medicine

## 2020-08-21 DIAGNOSIS — G8929 Other chronic pain: Secondary | ICD-10-CM

## 2020-08-21 DIAGNOSIS — D649 Anemia, unspecified: Secondary | ICD-10-CM

## 2020-08-21 DIAGNOSIS — R1013 Epigastric pain: Secondary | ICD-10-CM

## 2020-08-23 ENCOUNTER — Other Ambulatory Visit: Payer: Medicare HMO

## 2020-08-30 NOTE — Progress Notes (Signed)
[facility] - Clinic Note  09/10/2020     CHIEF COMPLAINT Patient presents for Retina Follow Up   HISTORY OF PRESENT ILLNESS: Lindsay Harvey is a 84 y.o. female who presents to the clinic today for:   HPI    Retina Follow Up    Patient presents with  Wet AMD.  In both eyes.  This started 6 weeks ago.  I, the attending physician,  performed the HPI with the patient and updated documentation appropriately.          Comments    Patient here for 6 weeks retina follow up for exu ARMD OU. Patient states vision OD not able to see clearly. OD hurting. OS is ok. Uses soothe drops       Last edited by Rennis Chris, MD on 09/10/2020  8:32 AM. (History)    pt states   Referring physician: Pearson Grippe, MD [address] [place],  [place] [address]  HISTORICAL INFORMATION:   Selected notes from the MEDICAL RECORD NUMBER Referred by Dr. Marcha Solders for concern of AMD OU, CNV    CURRENT MEDICATIONS: Current Outpatient Medications (Ophthalmic Drugs)  Medication Sig  . carboxymethylcellulose (REFRESH PLUS) 0.5 % SOLN Place 1 drop into both eyes 3 (three) times daily as needed (dry/irritated eyes).   . prednisoLONE acetate (PRED FORTE) 1 % ophthalmic suspension Place 1 drop into the right eye 4 (four) times daily. (Patient not taking: Reported on 07/26/2020)  . REFRESH OPTIVE MEGA-3 0.5-1-0.5 % SOLN Place 1 drop into both eyes 3 (three) times daily as needed (discomfort/eye irritation.).   No current facility-administered medications for this visit. (Ophthalmic Drugs)   Current Outpatient Medications (Other)  Medication Sig  . calcium carbonate (OSCAL) 1500 (600 Ca) MG TABS tablet Take 600 mg of elemental calcium by mouth 2 (two) times a day.  . Cholecalciferol (VITAMIN D3) 50 MCG (2000 UT) TABS Take 2,000 Units by mouth daily.  . famotidine (PEPCID) 20 MG tablet Take 20 mg by mouth daily.  . methylPREDNISolone (MEDROL DOSEPAK) 4 MG TBPK tablet Use as  directed  . Multiple Vitamins-Minerals (EMERGEN-C IMMUNE PO) Take 1 packet by mouth daily as needed (for immune support).  . Multiple Vitamins-Minerals (PRESERVISION AREDS 2 PO) Take 1 tablet by mouth 2 (two) times a day.   . simvastatin (ZOCOR) 40 MG tablet Take 10 mg by mouth every evening. 1/4 tablet at night   Current Facility-Administered Medications (Other)  Medication Route  . Bevacizumab (AVASTIN) SOLN 1.25 mg Intravitreal  . Bevacizumab (AVASTIN) SOLN 1.25 mg Intravitreal  . Bevacizumab (AVASTIN) SOLN 1.25 mg Intravitreal  . Bevacizumab (AVASTIN) SOLN 1.25 mg Intravitreal  . Bevacizumab (AVASTIN) SOLN 1.25 mg Intravitreal  . Bevacizumab (AVASTIN) SOLN 1.25 mg Intravitreal  . Bevacizumab (AVASTIN) SOLN 1.25 mg Intravitreal  . Bevacizumab (AVASTIN) SOLN 1.25 mg Intravitreal  . Bevacizumab (AVASTIN) SOLN 1.25 mg Intravitreal  . Bevacizumab (AVASTIN) SOLN 1.25 mg Intravitreal  . Bevacizumab (AVASTIN) SOLN 1.25 mg Intravitreal  . Bevacizumab (AVASTIN) SOLN 1.25 mg Intravitreal  . Bevacizumab (AVASTIN) SOLN 1.25 mg Intravitreal      REVIEW OF SYSTEMS: ROS    Positive for: Gastrointestinal, Eyes   Negative for: Constitutional, Neurological, Skin, Genitourinary, Musculoskeletal, HENT, Endocrine, Cardiovascular, Respiratory, Psychiatric, Allergic/Imm, Heme/Lymph   Last edited by Laddie Aquas, COA on 09/10/2020  8:18 AM. (History)       ALLERGIES No Known Allergies  PAST MEDICAL HISTORY Past Medical History:  Diagnosis Date  .  Allergies   . 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 Headache   . Hypercholesterolemia   . Macular degeneration    Wet OU  . Wears glasses    Past Surgical History:  Procedure Laterality Date  . 25 GAUGE PARS PLANA VITRECTOMY WITH 20 GAUGE MVR PORT FOR MACULAR HOLE Right 04/07/2019   Procedure: 25 GAUGE PARS PLANA VITRECTOMY WITH 20 GAUGE MVR PORT FOR MACULAR HOLE;  Surgeon: Rennis Chris, MD;  Location: [facility] OR;   Service: Ophthalmology;  Laterality: Right;  . CATARACT EXTRACTION Bilateral    2013  . DILATION AND CURETTAGE OF UTERUS    . EYE SURGERY Bilateral 2013   Cat Sx  . GAS/FLUID EXCHANGE Right 04/07/2019   Procedure: Gas/Fluid Exchange;  Surgeon: Rennis Chris, MD;  Location: [facility] OR;  Service: Ophthalmology;  Laterality: Right;  . MEMBRANE PEEL Right 04/07/2019   Procedure: Eula Flax;  Surgeon: Rennis Chris, MD;  Location: [facility] OR;  Service: Ophthalmology;  Laterality: Right;  . PHOTOCOAGULATION WITH LASER Right 04/07/2019   Procedure: Photocoagulation With Laser;  Surgeon: Rennis Chris, MD;  Location: [facility] OR;  Service: Ophthalmology;  Laterality: Right;  . YAG LASER APPLICATION Bilateral     FAMILY HISTORY Family History  Problem Relation Age of Onset  . Stroke Father     SOCIAL HISTORY Social History   Tobacco Use  . Smoking status: Never Smoker  . Smokeless tobacco: Never Used  Vaping Use  . Vaping Use: Never used  Substance Use Topics  . Alcohol use: No  . Drug use: No         OPHTHALMIC EXAM:  Base Eye Exam    Visual Acuity (Snellen - Linear)      Right Left   Dist cc 20/40 -2 20/30 -2   Dist ph cc NI NI   Correction: Glasses       Tonometry (Tonopen, 8:15 AM)      Right Left   Pressure 11 13       Pupils      Dark Light Shape React APD   Right 3 2 Round Brisk None   Left 3 2 Round Brisk None       Visual Fields (Counting fingers)      Left Right    Full Full       Extraocular Movement      Right Left    Full Full       Neuro/Psych    Oriented x3: Yes   Mood/Affect: Normal       Dilation    Both eyes: 1.0% Mydriacyl, 2.5% Phenylephrine @ 8:15 AM        Slit Lamp and Fundus Exam    Slit Lamp Exam      Right Left   Lids/Lashes Dermatochalasis - upper lid, periorbital edema Dermatochalasis - upper lid   Conjunctiva/Sclera White and quiet White and quiet   Cornea Arcus, trace Punctate epithelial erosions, mild endo pigment Arcus, 1+  Punctate epithelial erosions, mild tear film debris, mild endo pigment   Anterior Chamber Deep, quiet Deep and quiet   Iris Round and dilated Round and dilated   Lens PC IOL in good postion with anterior capsular phimosis, 1+ Posterior capsular opacification PC IOL in good postion with anterior capsular phimosis, 1+ Posterior capsular opacification sparing center   Vitreous post vitrectomy Vitreous syneresis       Fundus Exam      Right Left  Disc Sharp rim, mild pallor, peripapillary drusen, PPP Pink and Sharp, +cupping   C/D Ratio 0.5 0.6   Macula flat; ERM/VMT gone, blunted foveal reflex, refractile drusen, trace cystic changes, pigment clumping, no heme or edema Blunted foveal reflex, Drusen, early Atrophy, RPE mottling and clumping, +PEDs/CNV, +heme temporal to fovea -- resolved, +edema/IRF -- stably improved   Vessels Vascular attenuation Vascular attenuation, mild Tortuousity   Periphery Attached, scattered peripheral drusen, laser changes superiorly Attached with peripheral drusen        Refraction    Wearing Rx      Sphere Cylinder Axis Add   Right -1.00 +0.75 180 +2.75   Left -0.50 +0.75 004 +2.75          IMAGING AND PROCEDURES  Imaging and Procedures for 02/09/18  OCT, Retina - OU - Both Eyes       Right Eye Quality was good. Central Foveal Thickness: 221. Progression has improved. Findings include retinal drusen , outer retinal atrophy, intraretinal hyper-reflective material, abnormal foveal contour, pigment epithelial detachment, no SRF, no IRF (Interval improvement in cystic changes temporal macula).   Left Eye Quality was good. Central Foveal Thickness: 275. Progression has been stable. Findings include vitreous traction, pigment epithelial detachment, abnormal foveal contour, retinal drusen , epiretinal membrane, outer retinal atrophy, no SRF, intraretinal fluid (exudative ARMD--mild improvement in cystic changes; stable PEDs improved; persistent VMT).    Notes Images taken, stored on drive  Diagnosis / Impression:  OD: Exudative ARMD; Interval improvement in cystic changes temporal macula; VMT stably resolved OS: exudative ARMD--mild improvement in IRF/cystic changes; central PEDs improved; persistent VMT  Clinical management:  See below  Abbreviations: NFP - Normal foveal profile. CME - cystoid macular edema. PED - pigment epithelial detachment. IRF - intraretinal fluid. SRF - subretinal fluid. EZ - ellipsoid zone. ERM - epiretinal membrane. ORA - outer retinal atrophy. ORT - outer retinal tubulation. SRHM - subretinal hyper-reflective material         Intravitreal Injection, Pharmacologic Agent - OS - Left Eye       Time Out 09/10/2020. 8:40 AM. Confirmed correct patient, procedure, site, and patient consented.   Anesthesia Topical anesthesia was used. Anesthetic medications included Lidocaine 2%, Proparacaine 0.5%.   Procedure Preparation included eyelid speculum, 5% betadine to ocular surface. A (32g) needle was used.   Injection:  1.25 mg Bevacizumab (AVASTIN) SOLN   NDC: 16109-604-54, Lot: [id number], Expiration date: 09/28/2020   Route: Intravitreal, Site: Left Eye, Waste: 0.05 mL  Post-op Post injection exam found visual acuity of at least counting fingers. The patient tolerated the procedure well. There were no complications. The patient received written and verbal post procedure care education.                 ASSESSMENT/PLAN:    ICD-10-CM   1. Exudative age-related macular degeneration of right eye with active choroidal neovascularization (HCC)  H35.3211   2. Exudative age-related macular degeneration of left eye with active choroidal neovascularization (HCC)  H35.3221 Intravitreal Injection, Pharmacologic Agent - OS - Left Eye    Bevacizumab (AVASTIN) SOLN 1.25 mg  3. 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 Retinal edema  H35.81 OCT, Retina - OU - Both Eyes  5. Pseudophakia of both eyes   Z96.1     1. 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    - S/P IVA OD #1 (12.03.18), #2 (01.02.19), #3 (01.30.19), #4 (03.01.19), #5 (04.01.19), #6 (04.30.19), #7 (06.05.19), #8 (  07.19.19), #9 (08.30.19), #10 (10.14.19), #11 (11.18.19), #12 (12.30.19), #13 (02.10.20), #14 (04.06.20), #15 (09.17.20), #16 (10.15.20), #17 (12.01.20), #18 (01.18.21), #19 (03.15.21), #20 (5.11.21), #21 (07.20.21), #22 (10.14.21)  - has had good response to IVA -- has been stable on 3 month interval -- will continue maintenance treat q833months  - FA (04.01.19) shows mild CNVM OD consistent with exudative ARMD  - OCT shows interval improvement in recurrent cystic changes temporal macula OD  - BCVA stable at 20/40   - Avastin informed consent form signed and scanned on 01.18.2021  - f/u 6 wks -- DFE/OCT/possible injection   2. Age related macular degeneration, exudative, OS  - interval conversion from non-exudative to exudative noted 07.20.21  - s/p IVA OS #1 (07.20.21), #2 (08.19.21), #3 (09.16.21), #4 (10.14.21)  - exam shows focal heme overlying PEDs temporal macula stably resolved  - OCT with stable improvement in IRF; stable PED  - BCVA stable at 20/30   - recommend IVA OS #5 today, 11.29.21 -- maintenance w/ 6 wk interval  - pt wishes to proceed with injection  - RBA of procedure discussed, questions answered  - informed consent obtained, signed and scanned, 07.20.21  - see procedure note  - f/u 6 weeks, DFE, OCT, possible injection tx and ext as able  3,4. 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 Pseudophakia OU  - s/p CE/IOL  OU  - s/p YAG OU  - beautiful surgeries, doing well  - monitor  Ophthalmic Meds Ordered this visit:  Meds ordered this encounter  Medications  . Bevacizumab (AVASTIN) SOLN 1.25 mg       Return in about 6 weeks (around 10/22/2020) for ex ARMD OU --, Dilated Exam, OCT, Possible Injxn.  There are no Patient Instructions on file for this visit.  This document serves as a record of services personally performed by Karie ChimeraBrian G. Noele Icenhour, MD, PhD. It was created on their behalf by Herby AbrahamAshley English, COA, an ophthalmic technician. The creation of this record is the provider's dictation and/or activities during the visit.    Electronically signed by: Herby AbrahamAshley English, COA 11.18.2021 9:01 AM   This document serves as a record of services personally performed by Karie ChimeraBrian G. Jerae Izard, MD, PhD. It was created on their behalf by Glee ArvinAmanda J. Manson PasseyBrown, OA an ophthalmic technician. The creation of this record is the provider's dictation and/or activities during the visit.    Electronically signed by: Glee ArvinAmanda J. Manson PasseyBrown, [place] 11.29.2021 9:01 AM  Karie ChimeraBrian G. Alanis Clift, M.D., Ph.D. Diseases & Surgery of the Retina and Vitreous [facility] 09/10/2020   I have reviewed the above documentation for accuracy and completeness, and I agree with the above. Karie ChimeraBrian G. Viviano Bir, M.D., Ph.D. 09/10/20 9:01 AM   Abbreviations: M myopia (nearsighted); A astigmatism; H hyperopia (farsighted); P presbyopia; Mrx spectacle prescription;  CTL contact lenses; OD right eye; OS left eye; OU both eyes  XT exotropia; ET esotropia;  PEK punctate epithelial keratitis; PEE punctate epithelial erosions; DES dry eye syndrome; MGD meibomian gland dysfunction; ATs artificial tears; PFAT's preservative free artificial tears; NSC nuclear sclerotic cataract; PSC posterior subcapsular cataract; ERM epi-retinal membrane; PVD posterior vitreous detachment; RD retinal detachment; DM diabetes mellitus; DR diabetic retinopathy; NPDR non-proliferative  diabetic retinopathy; PDR proliferative diabetic retinopathy; CSME clinically significant macular edema; DME diabetic macular edema; dbh dot blot hemorrhages; CWS cotton wool spot; POAG primary open angle glaucoma; C/D cup-to-disc ratio; HVF humphrey visual field; GVF goldmann visual field; OCT optical coherence tomography; IOP intraocular pressure; BRVO Branch retinal vein occlusion; CRVO central retinal vein occlusion; CRAO central retinal artery occlusion; BRAO branch retinal artery occlusion; RT retinal tear; SB scleral buckle; PPV pars plana vitrectomy; VH Vitreous hemorrhage; PRP panretinal laser photocoagulation; IVK intravitreal kenalog; VMT vitreomacular traction; MH Macular hole;  NVD neovascularization of the disc; NVE neovascularization elsewhere; AREDS age related eye disease study; ARMD age related macular degeneration; POAG primary open angle glaucoma; EBMD epithelial/anterior basement membrane dystrophy; ACIOL anterior chamber intraocular lens; IOL intraocular lens; PCIOL posterior chamber intraocular lens; Phaco/IOL phacoemulsification with intraocular lens placement; PRK photorefractive keratectomy; LASIK laser assisted in situ keratomileusis; HTN hypertension; DM diabetes mellitus; COPD chronic obstructive pulmonary disease

## 2020-09-10 ENCOUNTER — Encounter (INDEPENDENT_AMBULATORY_CARE_PROVIDER_SITE_OTHER): Payer: Self-pay | Admitting: Ophthalmology

## 2020-09-10 ENCOUNTER — Other Ambulatory Visit: Payer: Self-pay

## 2020-09-10 ENCOUNTER — Ambulatory Visit (INDEPENDENT_AMBULATORY_CARE_PROVIDER_SITE_OTHER): Payer: Medicare HMO | Admitting: Ophthalmology

## 2020-09-10 DIAGNOSIS — H353211 Exudative age-related macular degeneration, right eye, with active choroidal neovascularization: Secondary | ICD-10-CM | POA: Diagnosis not present

## 2020-09-10 DIAGNOSIS — H353221 Exudative age-related macular degeneration, left eye, with active choroidal neovascularization: Secondary | ICD-10-CM

## 2020-09-10 DIAGNOSIS — Z961 Presence of intraocular lens: Secondary | ICD-10-CM

## 2020-09-10 DIAGNOSIS — H43823 Vitreomacular adhesion, bilateral: Secondary | ICD-10-CM

## 2020-09-10 DIAGNOSIS — H3581 Retinal edema: Secondary | ICD-10-CM | POA: Diagnosis not present

## 2020-09-10 DIAGNOSIS — H43821 Vitreomacular adhesion, right eye: Secondary | ICD-10-CM

## 2020-09-10 DIAGNOSIS — H43392 Other vitreous opacities, left eye: Secondary | ICD-10-CM

## 2020-09-10 DIAGNOSIS — H43393 Other vitreous opacities, bilateral: Secondary | ICD-10-CM

## 2020-09-10 DIAGNOSIS — H353121 Nonexudative age-related macular degeneration, left eye, early dry stage: Secondary | ICD-10-CM

## 2020-09-10 MED ORDER — BEVACIZUMAB CHEMO INJECTION 1.25MG/0.05ML SYRINGE FOR KALEIDOSCOPE
1.2500 mg | INTRAVITREAL | Status: AC | PRN
  Administered 2020-09-10: 1.25 mg via INTRAVITREAL

## 2020-09-14 ENCOUNTER — Ambulatory Visit: Payer: Medicare HMO | Admitting: Medical

## 2020-10-18 NOTE — Progress Notes (Signed)
[facility] - Clinic Note  10/22/2020     CHIEF COMPLAINT Patient presents for Retina Follow Up   HISTORY OF PRESENT ILLNESS: Lindsay Harvey is a 85 y.o. female who presents to the clinic today for:   HPI    Retina Follow Up    Patient presents with  Wet AMD.  In both eyes.  This started months ago.  Severity is moderate.  Duration of 6 weeks.  Since onset it is stable.  I, the attending physician,  performed the HPI with the patient and updated documentation appropriately.          Comments    85 y/o female pt here for 6 wk f/u for exu ARMD OU.  No change in [place] OU.  Denies pain, FOL, floaters.  Soothe prn OU.  Responses assisted by translator.       Last edited by Rennis Chris, MD on 10/22/2020  9:30 AM. (History)    pt states she had a red spot on her eye for 2 weeks after last injection  Referring physician: Pearson Grippe, MD [address] [place],  [place] [address]  HISTORICAL INFORMATION:   Selected notes from the MEDICAL RECORD NUMBER Referred by Dr. Marcha Solders for concern of AMD OU, CNV    CURRENT MEDICATIONS: Current Outpatient Medications (Ophthalmic Drugs)  Medication Sig   carboxymethylcellulose (REFRESH PLUS) 0.5 % SOLN Place 1 drop into both eyes 3 (three) times daily as needed (dry/irritated eyes).    prednisoLONE acetate (PRED FORTE) 1 % ophthalmic suspension Place 1 drop into the right eye 4 (four) times daily.   REFRESH OPTIVE MEGA-3 0.5-1-0.5 % SOLN Place 1 drop into both eyes 3 (three) times daily as needed (discomfort/eye irritation.).   No current facility-administered medications for this visit. (Ophthalmic Drugs)   Current Outpatient Medications (Other)  Medication Sig   calcium carbonate (OSCAL) 1500 (600 Ca) MG TABS tablet Take 600 mg of elemental calcium by mouth 2 (two) times a day.   Cholecalciferol (VITAMIN D3) 50 MCG (2000 UT) TABS Take 2,000 Units by mouth daily.   famotidine (PEPCID) 20 MG tablet Take 20  mg by mouth daily.   methylPREDNISolone (MEDROL DOSEPAK) 4 MG TBPK tablet Use as directed   Multiple Vitamins-Minerals (EMERGEN-C IMMUNE PO) Take 1 packet by mouth daily as needed (for immune support).   Multiple Vitamins-Minerals (PRESERVISION AREDS 2 PO) Take 1 tablet by mouth 2 (two) times a day.    omeprazole (PRILOSEC) 40 MG capsule 1 cap(s)   simvastatin (ZOCOR) 40 MG tablet Take 10 mg by mouth every evening. 1/4 tablet at night   Current Facility-Administered Medications (Other)  Medication Route   Bevacizumab (AVASTIN) SOLN 1.25 mg Intravitreal   Bevacizumab (AVASTIN) SOLN 1.25 mg Intravitreal   Bevacizumab (AVASTIN) SOLN 1.25 mg Intravitreal   Bevacizumab (AVASTIN) SOLN 1.25 mg Intravitreal   Bevacizumab (AVASTIN) SOLN 1.25 mg Intravitreal   Bevacizumab (AVASTIN) SOLN 1.25 mg Intravitreal   Bevacizumab (AVASTIN) SOLN 1.25 mg Intravitreal   Bevacizumab (AVASTIN) SOLN 1.25 mg Intravitreal   Bevacizumab (AVASTIN) SOLN 1.25 mg Intravitreal   Bevacizumab (AVASTIN) SOLN 1.25 mg Intravitreal   Bevacizumab (AVASTIN) SOLN 1.25 mg Intravitreal   Bevacizumab (AVASTIN) SOLN 1.25 mg Intravitreal   Bevacizumab (AVASTIN) SOLN 1.25 mg Intravitreal      REVIEW OF SYSTEMS: ROS    Positive for: Gastrointestinal, Eyes   Negative for: Constitutional, Neurological, Skin, Genitourinary, Musculoskeletal, HENT, Endocrine, Cardiovascular, Respiratory, Psychiatric, Allergic/Imm, Heme/Lymph   Last  edited by [name], COA on 10/22/2020  8:37 AM. 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   Headache    Hypercholesterolemia    Macular degeneration    Wet OU   Wears glasses    Past Surgical History:  Procedure Laterality Date   25 GAUGE PARS PLANA VITRECTOMY WITH 20 GAUGE MVR PORT FOR MACULAR HOLE Right 04/07/2019    Procedure: 25 GAUGE PARS PLANA VITRECTOMY WITH 20 GAUGE MVR PORT FOR MACULAR HOLE;  Surgeon: Rennis Chris, MD;  Location: [facility] OR;  Service: Ophthalmology;  Laterality: Right;   CATARACT EXTRACTION Bilateral    2013   DILATION AND CURETTAGE OF UTERUS     EYE SURGERY Bilateral 2013   Cat Sx   GAS/FLUID EXCHANGE Right 04/07/2019   Procedure: Gas/Fluid Exchange;  Surgeon: Rennis Chris, MD;  Location: [facility] OR;  Service: Ophthalmology;  Laterality: Right;   MEMBRANE PEEL Right 04/07/2019   Procedure: [place] Flax;  Surgeon: Rennis Chris, MD;  Location: [facility] OR;  Service: Ophthalmology;  Laterality: Right;   PHOTOCOAGULATION WITH LASER Right 04/07/2019   Procedure: Photocoagulation With Laser;  Surgeon: Rennis Chris, MD;  Location: [facility] OR;  Service: Ophthalmology;  Laterality: Right;   YAG LASER APPLICATION Bilateral     FAMILY HISTORY Family History  Problem Relation Age of Onset   Stroke Father     SOCIAL HISTORY Social History   Tobacco Use   Smoking status: Never Smoker   Smokeless tobacco: Never Used  Vaping Use   Vaping Use: Never used  Substance Use Topics   Alcohol use: No   Drug use: No         OPHTHALMIC EXAM:  Base Eye Exam    Visual Acuity (Snellen - Linear)      Right Left   Dist cc 20/40 -2 20/30 -2   Dist ph cc NI NI   Correction: Glasses       Tonometry (Tonopen, 8:40 AM)      Right Left   Pressure 11 13       Pupils      Dark Light Shape React APD   Right 3 2 Round Brisk None   Left 3 2 Round Brisk None       Visual Fields (Counting fingers)      Left Right    Full Full       Extraocular Movement      Right Left    Full, Ortho Full, Ortho       Neuro/Psych    Oriented x3: Yes   Mood/Affect: Normal       Dilation    Both eyes: 1.0% Mydriacyl, 2.5% Phenylephrine @ 8:39 AM        Slit Lamp and Fundus Exam    Slit Lamp Exam      Right Left   Lids/Lashes Dermatochalasis - upper lid, periorbital edema Dermatochalasis -  upper lid   Conjunctiva/Sclera White and quiet White and quiet   Cornea Arcus, trace Punctate epithelial erosions, mild endo pigment Arcus, 1+ Punctate epithelial erosions, mild tear film debris, mild endo pigment   Anterior Chamber Deep, quiet Deep and quiet   Iris Round and dilated Round and dilated   Lens PC IOL in good postion with anterior capsular phimosis, 1+ Posterior capsular opacification PC IOL  in good postion with anterior capsular phimosis, 1+ Posterior capsular opacification sparing center   Vitreous post vitrectomy Vitreous syneresis       Fundus Exam      Right Left   Disc Sharp rim, mild pallor, peripapillary drusen, PPP Pink and Sharp, +cupping   C/D Ratio 0.5 0.6   Macula flat; ERM/VMT gone, blunted foveal reflex, refractile drusen, trace cystic changes, pigment clumping, no heme or edema Blunted foveal reflex, Drusen, early Atrophy, RPE mottling and clumping, +PEDs/CNV, +edema/IRF -- stably improved, no heme   Vessels Vascular attenuation Vascular attenuation, mild Tortuousity   Periphery Attached, scattered peripheral drusen, laser changes superiorly Attached with peripheral drusen          IMAGING AND PROCEDURES  Imaging and Procedures for 02/09/18  OCT, Retina - OU - Both Eyes       Right Eye Quality was good. Central Foveal Thickness: 227. Progression has improved. Findings include retinal drusen , outer retinal atrophy, intraretinal hyper-reflective material, abnormal foveal contour, pigment epithelial detachment, no SRF, no IRF (trace cystic changes IT to fovea).   Left Eye Quality was good. Central Foveal Thickness: 272. Progression has been stable. Findings include vitreous traction, pigment epithelial detachment, abnormal foveal contour, retinal drusen , epiretinal membrane, outer retinal atrophy, no SRF, intraretinal fluid (persistent VMT and cystic changes).   Notes Images taken, stored on drive  Diagnosis / Impression:  OD: Exudative ARMD; trace  cystic changes IT to fovea OS: exudative ARMD--persistent VMT and cystic changes  Clinical management:  See below  Abbreviations: NFP - Normal foveal profile. CME - cystoid macular edema. PED - pigment epithelial detachment. IRF - intraretinal fluid. SRF - subretinal fluid. EZ - ellipsoid zone. ERM - epiretinal membrane. ORA - outer retinal atrophy. ORT - outer retinal tubulation. SRHM - subretinal hyper-reflective material         Intravitreal Injection, Pharmacologic Agent - OD - Right Eye       Time Out 10/22/2020. 9:01 AM. Confirmed correct patient, procedure, site, and patient consented.   Anesthesia Topical anesthesia was used. Anesthetic medications included Lidocaine 2%, Proparacaine 0.5%.   Procedure Preparation included 5% betadine to ocular surface, eyelid speculum. A supplied needle was used.   Injection:  1.25 mg Bevacizumab (AVASTIN) 1.25mg /0.5mL SOLN   NDC: 27062-376-28, Lot: [id number] , Expiration date: 12/12/2020   Route: Intravitreal, Site: Right Eye, Waste: 0 mL  Post-op Post injection exam found visual acuity of at least counting fingers. The patient tolerated the procedure well. There were no complications. The patient received written and verbal post procedure care education. Post injection medications were not given.        Intravitreal Injection, Pharmacologic Agent - OS - Left Eye       Time Out 10/22/2020. 9:01 AM. Confirmed correct patient, procedure, site, and patient consented.   Anesthesia Topical anesthesia was used. Anesthetic medications included Lidocaine 2%, Proparacaine 0.5%.   Procedure Preparation included eyelid speculum, 5% betadine to ocular surface. A supplied needle was used.   Injection:  1.25 mg Bevacizumab (AVASTIN) 1.25mg /0.1mL SOLN   NDC: 80m, Lot: [id number] , Expiration date: 11/21/2020   Route: Intravitreal, Site: Left Eye, Waste: 0 mL  Post-op Post injection exam found visual acuity of at least counting  fingers. The patient tolerated the procedure well. There were no complications. The patient received written and verbal post procedure care education. Post injection medications were not given.                 ASSESSMENT/PLAN:  ICD-10-CM   1. 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 Exudative age-related macular degeneration of left eye with active choroidal neovascularization (HCC)  H35.3221 Intravitreal Injection, Pharmacologic Agent - OS - Left Eye    Bevacizumab (AVASTIN) SOLN 1.25 mg  3. 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 Retinal edema  H35.81 OCT, Retina - OU - Both Eyes  5. Pseudophakia of both eyes  Z96.1     1. 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    - S/P IVA OD #1 (12.03.18), #2 (01.02.19), #3 (01.30.19), #4 (03.01.19), #5 (04.01.19), #6 (04.30.19), #7 (06.05.19), #8 (07.19.19), #9 (08.30.19), #10 (10.14.19), #11 (11.18.19), #12 (12.30.19), #13 (02.10.20), #14 (04.06.20), #15 (09.17.20), #16 (10.15.20), #17 (12.01.20), #18 (01.18.21), #19 (03.15.21), #20 (5.11.21), #21 (07.20.21), #22 (10.14.21)  - has had good response to IVA -- has been stable on 3 month interval -- will continue maintenance treat q3months  - FA (04.01.19) shows mild CNVM OD consistent with exudative ARMD  - OCT shows trace cystic changes IT to fovea OD  - BCVA stable at 20/40   - recommend IVA OD #23 today, 01.10.22  - pt wishes to proceed  - RBA of procedure discussed, questions answered  - informed consent obtained and signed  - see procedure note  - Avastin informed consent form signed and scanned on 01.18.2021  - f/u 8 wks -- DFE/OCT/possible injection   2. Age related macular degeneration, exudative, OS  - interval conversion from non-exudative to  exudative noted 07.20.21  - s/p IVA OS #1 (07.20.21), #2 (08.19.21), #3 (09.16.21), #4 (10.14.21), #5 (11.29.21)  - exam shows focal heme overlying PEDs temporal macula stably resolved  - OCT with persistent VMT and cystic changes  - BCVA stable at 20/30   - recommend IVA OS #6 today, 01.10.22 -- maintenance w/ 8 wk interval  - pt wishes to proceed with injection  - RBA of procedure discussed, questions answered  - informed consent obtained, signed and scanned, 07.20.21  - see procedure note  - f/u 8 weeks, DFE, OCT, possible injection tx and ext as able  3,4. 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 Pseudophakia OU  - s/p CE/IOL OU  - s/p YAG OU  - beautiful surgeries, doing well  - monitor  Ophthalmic Meds Ordered this visit:  Meds ordered this encounter  Medications   Bevacizumab (AVASTIN) SOLN 1.25 mg   Bevacizumab (AVASTIN) SOLN 1.25 mg       Return in about 8 weeks (around 12/17/2020) for f/u exu ARMD OU, DFE, OCT.  There are no Patient Instructions on file for this visit.  This document serves as a record of services personally performed by Karie Chimera, MD, PhD. It was created on their behalf by  Herby Abraham, COA, an ophthalmic technician. The creation of this record is the provider's dictation and/or activities during the visit.    Electronically signed by: Herby Abraham, COA 01.06.2022 9:42 AM   This document serves as a record of services personally performed by Karie Chimera, MD, PhD. It was created on their behalf by Glee Arvin. Manson Passey, OA an ophthalmic technician. The creation of this record is the provider's dictation and/or  activities during the visit.    Electronically signed by: Glee Arvin. Manson Passey, [place] 01.10.2022 9:42 AM  Karie Chimera, M.D., Ph.D. Diseases & Surgery of the Retina and Vitreous [facility] 10/22/2020   I have reviewed the above documentation for accuracy and completeness, and I agree with the above. Karie Chimera, M.D., Ph.D. 10/22/20 9:42 AM  Abbreviations: M myopia (nearsighted); A astigmatism; H hyperopia (farsighted); P presbyopia; Mrx spectacle prescription;  CTL contact lenses; OD right eye; OS left eye; OU both eyes  XT exotropia; ET esotropia; PEK punctate epithelial keratitis; PEE punctate epithelial erosions; DES dry eye syndrome; MGD meibomian gland dysfunction; ATs artificial tears; PFAT's preservative free artificial tears; NSC nuclear sclerotic cataract; PSC posterior subcapsular cataract; ERM epi-retinal membrane; PVD posterior vitreous detachment; RD retinal detachment; DM diabetes mellitus; DR diabetic retinopathy; NPDR non-proliferative diabetic retinopathy; PDR proliferative diabetic retinopathy; CSME clinically significant macular edema; DME diabetic macular edema; dbh dot blot hemorrhages; CWS cotton wool spot; POAG primary open angle glaucoma; C/D cup-to-disc ratio; HVF humphrey visual field; GVF goldmann visual field; OCT optical coherence tomography; IOP intraocular pressure; BRVO Branch retinal vein occlusion; CRVO central retinal vein occlusion; CRAO central retinal artery occlusion; BRAO branch retinal artery occlusion; RT retinal tear; SB scleral buckle; PPV pars plana vitrectomy; VH Vitreous hemorrhage; PRP panretinal laser photocoagulation; IVK intravitreal kenalog; VMT vitreomacular traction; MH Macular hole;  NVD neovascularization of the disc; NVE neovascularization elsewhere; AREDS age related eye disease study; ARMD age related macular degeneration; POAG primary open angle glaucoma; EBMD epithelial/anterior basement membrane dystrophy; ACIOL  anterior chamber intraocular lens; IOL intraocular lens; PCIOL posterior chamber intraocular lens; Phaco/IOL phacoemulsification with intraocular lens placement; PRK photorefractive keratectomy; LASIK laser assisted in situ keratomileusis; HTN hypertension; DM diabetes mellitus; COPD chronic obstructive pulmonary disease

## 2020-10-22 ENCOUNTER — Ambulatory Visit (INDEPENDENT_AMBULATORY_CARE_PROVIDER_SITE_OTHER): Payer: Medicare HMO | Admitting: Ophthalmology

## 2020-10-22 ENCOUNTER — Encounter (INDEPENDENT_AMBULATORY_CARE_PROVIDER_SITE_OTHER): Payer: Self-pay | Admitting: Ophthalmology

## 2020-10-22 ENCOUNTER — Other Ambulatory Visit: Payer: Self-pay

## 2020-10-22 DIAGNOSIS — H43393 Other vitreous opacities, bilateral: Secondary | ICD-10-CM

## 2020-10-22 DIAGNOSIS — H43823 Vitreomacular adhesion, bilateral: Secondary | ICD-10-CM | POA: Diagnosis not present

## 2020-10-22 DIAGNOSIS — H353211 Exudative age-related macular degeneration, right eye, with active choroidal neovascularization: Secondary | ICD-10-CM

## 2020-10-22 DIAGNOSIS — H353121 Nonexudative age-related macular degeneration, left eye, early dry stage: Secondary | ICD-10-CM

## 2020-10-22 DIAGNOSIS — H353221 Exudative age-related macular degeneration, left eye, with active choroidal neovascularization: Secondary | ICD-10-CM

## 2020-10-22 DIAGNOSIS — H3581 Retinal edema: Secondary | ICD-10-CM | POA: Diagnosis not present

## 2020-10-22 DIAGNOSIS — H43821 Vitreomacular adhesion, right eye: Secondary | ICD-10-CM

## 2020-10-22 DIAGNOSIS — Z961 Presence of intraocular lens: Secondary | ICD-10-CM

## 2020-10-22 DIAGNOSIS — H43392 Other vitreous opacities, left eye: Secondary | ICD-10-CM

## 2020-10-22 MED ORDER — BEVACIZUMAB CHEMO INJECTION 1.25MG/0.05ML SYRINGE FOR KALEIDOSCOPE
1.2500 mg | INTRAVITREAL | Status: AC | PRN
  Administered 2020-10-22: 1.25 mg via INTRAVITREAL

## 2020-12-13 NOTE — Progress Notes (Signed)
[facility] - Clinic Note  12/17/2020     CHIEF COMPLAINT Patient presents for Retina Follow Up   HISTORY OF PRESENT ILLNESS: Lindsay Harvey is a 85 y.o. female who presents to the clinic today for:   HPI    Retina Follow Up    Patient presents with  Wet AMD.  In right eye.  This started weeks ago.  Severity is moderate.  Duration of weeks.  Since onset it is stable.  I, the attending physician,  performed the HPI with the patient and updated documentation appropriately.          Comments    Pt states her vision is the same OU.  Pt denies eye pain or discomfort and denies any new or worsening floaters or fol OU.       Last edited by Rennis Chris, MD on 12/17/2020  9:02 AM. (History)    pt states vision is doing well, she is using Refresh Omega drops for dryness  Referring physician: Ralene Ok, MD [id number] [facility] [address] [place],  [place] [address]  HISTORICAL INFORMATION:   Selected notes from the MEDICAL RECORD NUMBER Referred by Dr. Marcha Solders for concern of AMD OU, CNV    CURRENT MEDICATIONS: Current Outpatient Medications (Ophthalmic Drugs)  Medication Sig  . carboxymethylcellulose (REFRESH PLUS) 0.5 % SOLN Place 1 drop into both eyes 3 (three) times daily as needed (dry/irritated eyes).   . prednisoLONE acetate (PRED FORTE) 1 % ophthalmic suspension Place 1 drop into the right eye 4 (four) times daily.  Marland Kitchen REFRESH OPTIVE MEGA-3 0.5-1-0.5 % SOLN Place 1 drop into both eyes 3 (three) times daily as needed (discomfort/eye irritation.).   No current facility-administered medications for this visit. (Ophthalmic Drugs)   Current Outpatient Medications (Other)  Medication Sig  . calcium carbonate (OSCAL) 1500 (600 Ca) MG TABS tablet Take 600 mg of elemental calcium by mouth 2 (two) times a day.  . Cholecalciferol (VITAMIN D3) 50 MCG (2000 UT) TABS Take 2,000 Units by mouth daily.  . famotidine (PEPCID) 20 MG tablet Take 20 mg by mouth daily.  . methylPREDNISolone  (MEDROL DOSEPAK) 4 MG TBPK tablet Use as directed  . Multiple Vitamins-Minerals (EMERGEN-C IMMUNE PO) Take 1 packet by mouth daily as needed (for immune support).  . Multiple Vitamins-Minerals (PRESERVISION AREDS 2 PO) Take 1 tablet by mouth 2 (two) times a day.   Marland Kitchen omeprazole (PRILOSEC) 40 MG capsule 1 cap(s)  . simvastatin (ZOCOR) 40 MG tablet Take 10 mg by mouth every evening. 1/4 tablet at night   Current Facility-Administered Medications (Other)  Medication Route  . Bevacizumab (AVASTIN) SOLN 1.25 mg Intravitreal  . Bevacizumab (AVASTIN) SOLN 1.25 mg Intravitreal  . Bevacizumab (AVASTIN) SOLN 1.25 mg Intravitreal  . Bevacizumab (AVASTIN) SOLN 1.25 mg Intravitreal  . Bevacizumab (AVASTIN) SOLN 1.25 mg Intravitreal  . Bevacizumab (AVASTIN) SOLN 1.25 mg Intravitreal  . Bevacizumab (AVASTIN) SOLN 1.25 mg Intravitreal  . Bevacizumab (AVASTIN) SOLN 1.25 mg Intravitreal  . Bevacizumab (AVASTIN) SOLN 1.25 mg Intravitreal  . Bevacizumab (AVASTIN) SOLN 1.25 mg Intravitreal  . Bevacizumab (AVASTIN) SOLN 1.25 mg Intravitreal  . Bevacizumab (AVASTIN) SOLN 1.25 mg Intravitreal  . Bevacizumab (AVASTIN) SOLN 1.25 mg Intravitreal      REVIEW OF SYSTEMS: ROS    Positive for: Gastrointestinal, Eyes   Negative for: Constitutional, Neurological, Skin, Genitourinary, Musculoskeletal, HENT, Endocrine, Cardiovascular, Respiratory, Psychiatric, Allergic/Imm, Heme/Lymph   Last edited by Corrinne Eagle on 12/17/2020  8:38 AM. (History)  ALLERGIES No Known Allergies  PAST MEDICAL HISTORY Past Medical History:  Diagnosis Date  . Allergies   . 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 Headache   . Hypercholesterolemia   . Macular degeneration    Wet OU  . Wears glasses    Past Surgical History:  Procedure Laterality Date  . 25 GAUGE PARS PLANA VITRECTOMY WITH 20 GAUGE MVR PORT FOR MACULAR HOLE Right 04/07/2019   Procedure: 25 GAUGE PARS PLANA VITRECTOMY WITH  20 GAUGE MVR PORT FOR MACULAR HOLE;  Surgeon: Rennis ChrisZamora, Brian, MD;  [facility];  Service: Ophthalmology;  Laterality: Right;  . CATARACT EXTRACTION Bilateral    2013  . DILATION AND CURETTAGE OF UTERUS    . EYE SURGERY Bilateral 2013   Cat Sx  . GAS/FLUID EXCHANGE Right 04/07/2019   Procedure: Gas/Fluid Exchange;  Surgeon: Rennis ChrisZamora, Brian, MD;  [facility];  Service: Ophthalmology;  Laterality: Right;  . MEMBRANE PEEL Right 04/07/2019   Procedure: Eula FlaxMembrane Peel;  Surgeon: Rennis ChrisZamora, Brian, MD;  [facility];  Service: Ophthalmology;  Laterality: Right;  . PHOTOCOAGULATION WITH LASER Right 04/07/2019   Procedure: Photocoagulation With Laser;  Surgeon: Rennis ChrisZamora, Brian, MD;  [facility];  Service: Ophthalmology;  Laterality: Right;  . YAG LASER APPLICATION Bilateral     FAMILY HISTORY Family History  Problem Relation Age of Onset  . Stroke Father     SOCIAL HISTORY Social History   Tobacco Use  . Smoking status: Never Smoker  . Smokeless tobacco: Never Used  Vaping Use  . Vaping Use: Never used  Substance Use Topics  . Alcohol use: No  . Drug use: No         OPHTHALMIC EXAM:  Base Eye Exam    Visual Acuity (Snellen - Linear)      Right Left   Dist cc 20/40 -2 20/40 -2   Dist ph cc NI NI   Correction: Glasses       Tonometry (Tonopen, 8:48 AM)      Right Left   Pressure 13 13       Pupils      Dark Light Shape React APD   Right 3 2 Round Minimal 0   Left 3 2 Round Minimal 0       Visual Fields      Left Right    Full Full       Extraocular Movement      Right Left    Full, Ortho Full, Ortho       Neuro/Psych    Oriented x3: Yes   Mood/Affect: Normal       Dilation    Both eyes: 1.0% Mydriacyl, 2.5% Phenylephrine @ 8:48 AM        Slit Lamp and Fundus Exam    Slit Lamp Exam      Right Left   Lids/Lashes Dermatochalasis - upper lid, periorbital edema Dermatochalasis - upper lid   Conjunctiva/Sclera White and quiet White and quiet    Cornea Arcus, trace Punctate epithelial erosions, mild endo pigment Arcus, 1+ Punctate epithelial erosions, mild tear film debris, mild endo pigment   Anterior Chamber Deep, quiet Deep and quiet   Iris Round and dilated Round and dilated   Lens PC IOL in good postion with anterior capsular phimosis, 1+ Posterior capsular opacification PC IOL in good postion with anterior capsular phimosis, 1+ Posterior capsular opacification sparing center   Vitreous post vitrectomy Vitreous syneresis  Fundus Exam      Right Left   Disc Sharp rim, mild pallor, peripapillary drusen, PPP Pink and Sharp, +cupping   C/D Ratio 0.5 0.75   Macula flat; ERM/VMT gone, blunted foveal reflex, refractile drusen, trace cystic changes, pigment clumping, no heme or edema Blunted foveal reflex, Drusen, early Atrophy, RPE mottling and clumping, +PEDs/CNV, +edema/IRF -- stably improved, no heme   Vessels attenuated, mild tortuousity Vascular attenuation, mild Tortuousity   Periphery Attached, scattered peripheral drusen, laser changes superiorly Attached with peripheral drusen        Refraction    Wearing Rx      Sphere Cylinder Axis Add   Right -1.00 +0.75 180 +2.75   Left -0.50 +0.75 004 +2.75          IMAGING AND PROCEDURES  Imaging and Procedures for 02/09/18  OCT, Retina - OU - Both Eyes       Right Eye Quality was good. Central Foveal Thickness: 221. Progression has been stable. Findings include retinal drusen , outer retinal atrophy, intraretinal hyper-reflective material, abnormal foveal contour, pigment epithelial detachment, no SRF, no IRF (Interval improvement in cystic changes ).   Left Eye Quality was good. Central Foveal Thickness: 301. Progression has been stable. Findings include vitreous traction, pigment epithelial detachment, abnormal foveal contour, retinal drusen , epiretinal membrane, outer retinal atrophy, no SRF, intraretinal fluid (persistent VMT and cystic changes, no active  exudative disease).   Notes Images taken, stored on drive  Diagnosis / Impression:  OD: Exudative ARMD; Interval improvement in cystic changes  OS: exudative ARMD--persistent VMT and cystic changes, no active exudative disease   Clinical management:  See below  Abbreviations: NFP - Normal foveal profile. CME - cystoid macular edema. PED - pigment epithelial detachment. IRF - intraretinal fluid. SRF - subretinal fluid. EZ - ellipsoid zone. ERM - epiretinal membrane. ORA - outer retinal atrophy. ORT - outer retinal tubulation. SRHM - subretinal hyper-reflective material         Intravitreal Injection, Pharmacologic Agent - OD - Right Eye       Time Out 12/17/2020. 8:50 AM. Confirmed correct patient, procedure, site, and patient consented.   Anesthesia Topical anesthesia was used. Anesthetic medications included Lidocaine 2%, Proparacaine 0.5%.   Procedure Preparation included 5% betadine to ocular surface, eyelid speculum. A supplied needle was used.   Injection:  1.25 mg Bevacizumab (AVASTIN) 1.25mg /0.58mL SOLN   NDC: P3213405, Lot: [id number], Expiration date: 01/21/2021   Route: Intravitreal, Site: Right Eye, Waste: 0 mL  Post-op Post injection exam found visual acuity of at least counting fingers. The patient tolerated the procedure well. There were no complications. The patient received written and verbal post procedure care education. Post injection medications were not given.        Intravitreal Injection, Pharmacologic Agent - OS - Left Eye       Time Out 12/17/2020. 8:50 AM. Confirmed correct patient, procedure, site, and patient consented.   Anesthesia Topical anesthesia was used. Anesthetic medications included Lidocaine 2%, Proparacaine 0.5%.   Procedure Preparation included eyelid speculum, 5% betadine to ocular surface. A (32g) needle was used.   Injection:  1.25 mg Bevacizumab (AVASTIN) 1.25mg /0.74mL SOLN   NDC: 83151-761-60, Lot: [id number], Expiration  date: 02/19/2021   Route: Intravitreal, Site: Left Eye, Waste: 0.05 mL  Post-op Post injection exam found visual acuity of at least counting fingers. The patient tolerated the procedure well. There were no complications. The patient received written and verbal post procedure care education. Post injection medications  were not given.                 ASSESSMENT/PLAN:    ICD-10-CM   1. 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 Exudative age-related macular degeneration of left eye with active choroidal neovascularization (HCC)  H35.3221 Intravitreal Injection, Pharmacologic Agent - OS - Left Eye    Bevacizumab (AVASTIN) SOLN 1.25 mg  3. 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 Retinal edema  H35.81 OCT, Retina - OU - Both Eyes  5. Pseudophakia of both eyes  Z96.1     1. 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    - S/P IVA OD #1 (12.03.18), #2 (01.02.19), #3 (01.30.19), #4 (03.01.19), #5 (04.01.19), #6 (04.30.19), #7 (06.05.19), #8 (07.19.19), #9 (08.30.19), #10 (10.14.19), #11 (11.18.19), #12 (12.30.19), #13 (02.10.20), #14 (04.06.20), #15 (09.17.20), #16 (10.15.20), #17 (12.01.20), #18 (01.18.21), #19 (03.15.21), #20 (5.11.21), #21 (07.20.21), #22 (10.14.21), #23 (01.10.22)  - has had good response to IVA -- has been stable on 3 month interval  - FA (04.01.19) shows mild CNVM OD consistent with exudative ARMD  - OCT shows interval improvement in cystic changes OD  - BCVA stable at 20/40   - recommend IVA OD #24 today, 03.07.22 -- maintenance w/ f/u in 10 wks to coordinate with OS  - pt wishes to proceed  - RBA of procedure discussed, questions answered  - informed consent obtained and signed  - see procedure note  - Avastin informed consent form signed and  scanned on 01.18.2021  - f/u 10 wks -- DFE/OCT/possible injection   2. Age related macular degeneration, exudative, OS  - interval conversion from non-exudative to exudative noted 07.20.21  - s/p IVA OS #1 (07.20.21), #2 (08.19.21), #3 (09.16.21), #4 (10.14.21), #5 (11.29.21), #6 (01.10.22)  - exam shows focal heme overlying PEDs temporal macula stably resolved  - OCT with persistent VMT and cystic changes, no active exudative disease at 8 wk interval  - BCVA stable at 20/30   - recommend IVA OS #7 today, 03.07.22 -- maintenance w/ 10 wk interval  - pt wishes to proceed with injection  - RBA of procedure discussed, questions answered  - informed consent obtained, signed and scanned, 07.20.21  - see procedure note  - f/u 10 weeks, DFE, OCT, possible injection, tx and ext as able  3,4. 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 Pseudophakia OU  - s/p CE/IOL OU  - s/p YAG OU  - beautiful surgeries, doing well  - monitor  Ophthalmic Meds Ordered this visit:  Meds ordered this encounter  Medications  . Bevacizumab (AVASTIN) SOLN 1.25 mg  . Bevacizumab (AVASTIN) SOLN 1.25 mg       Return in about 10 weeks (around 02/25/2021) for f/u  exu ARMD OU, DFE, OCT.  There are no Patient Instructions on file for this visit.  This document serves as a record of services personally performed by Karie Chimera, MD, PhD. It was created on their behalf by Herby Abraham, COA, an ophthalmic technician. The creation of this record is the provider's dictation and/or activities during the visit.    Electronically signed by: Herby Abraham, COA @ 12:07 PM  Karie Chimera, M.D., Ph.D. Diseases & Surgery of the Retina and Vitreous [facility] 12/17/2020   I have reviewed the above documentation for accuracy and completeness, and I agree with the above. Karie Chimera, M.D., Ph.D. 12/17/20 12:07 PM   Abbreviations: M myopia (nearsighted); A astigmatism; H hyperopia (farsighted); P presbyopia; Mrx spectacle prescription;  CTL contact lenses; OD right eye; OS left eye; OU both eyes  XT exotropia; ET esotropia; PEK punctate epithelial keratitis; PEE punctate epithelial erosions; DES dry eye syndrome; MGD meibomian gland dysfunction; ATs artificial tears; PFAT's preservative free artificial tears; NSC nuclear sclerotic cataract; PSC posterior subcapsular cataract; ERM epi-retinal membrane; PVD posterior vitreous detachment; RD retinal detachment; DM diabetes mellitus; DR diabetic retinopathy; NPDR non-proliferative diabetic retinopathy; PDR proliferative diabetic retinopathy; CSME clinically significant macular edema; DME diabetic macular edema; dbh dot blot hemorrhages; CWS cotton wool spot; POAG primary open angle glaucoma; C/D cup-to-disc ratio; HVF humphrey visual field; GVF goldmann visual field; OCT optical coherence tomography; IOP intraocular pressure; BRVO Branch retinal vein occlusion; CRVO central retinal vein occlusion; CRAO central retinal artery occlusion; BRAO branch retinal artery occlusion; RT retinal tear; SB scleral buckle; PPV pars plana vitrectomy; VH Vitreous hemorrhage; PRP panretinal laser photocoagulation; IVK intravitreal kenalog; VMT vitreomacular traction; MH Macular hole;  NVD neovascularization of the disc; NVE neovascularization elsewhere; AREDS age related eye disease study; ARMD age related macular degeneration; POAG primary open angle glaucoma; EBMD epithelial/anterior basement membrane dystrophy; ACIOL anterior chamber intraocular lens; IOL intraocular lens; PCIOL posterior chamber intraocular lens; Phaco/IOL  phacoemulsification with intraocular lens placement; PRK photorefractive keratectomy; LASIK laser assisted in situ keratomileusis; HTN hypertension; DM diabetes mellitus; COPD chronic obstructive pulmonary disease

## 2020-12-17 ENCOUNTER — Ambulatory Visit (INDEPENDENT_AMBULATORY_CARE_PROVIDER_SITE_OTHER): Payer: Medicare HMO | Admitting: Ophthalmology

## 2020-12-17 ENCOUNTER — Encounter (INDEPENDENT_AMBULATORY_CARE_PROVIDER_SITE_OTHER): Payer: Self-pay | Admitting: Ophthalmology

## 2020-12-17 ENCOUNTER — Other Ambulatory Visit: Payer: Self-pay

## 2020-12-17 DIAGNOSIS — H353221 Exudative age-related macular degeneration, left eye, with active choroidal neovascularization: Secondary | ICD-10-CM | POA: Diagnosis not present

## 2020-12-17 DIAGNOSIS — H353211 Exudative age-related macular degeneration, right eye, with active choroidal neovascularization: Secondary | ICD-10-CM

## 2020-12-17 DIAGNOSIS — H43823 Vitreomacular adhesion, bilateral: Secondary | ICD-10-CM | POA: Diagnosis not present

## 2020-12-17 DIAGNOSIS — Z961 Presence of intraocular lens: Secondary | ICD-10-CM

## 2020-12-17 DIAGNOSIS — H3581 Retinal edema: Secondary | ICD-10-CM

## 2020-12-17 DIAGNOSIS — H43392 Other vitreous opacities, left eye: Secondary | ICD-10-CM

## 2020-12-17 DIAGNOSIS — H43821 Vitreomacular adhesion, right eye: Secondary | ICD-10-CM

## 2020-12-17 DIAGNOSIS — H353121 Nonexudative age-related macular degeneration, left eye, early dry stage: Secondary | ICD-10-CM

## 2020-12-17 DIAGNOSIS — H43393 Other vitreous opacities, bilateral: Secondary | ICD-10-CM

## 2020-12-17 MED ORDER — BEVACIZUMAB CHEMO INJECTION 1.25MG/0.05ML SYRINGE FOR KALEIDOSCOPE
1.2500 mg | INTRAVITREAL | Status: AC | PRN
  Administered 2020-12-17: 1.25 mg via INTRAVITREAL

## 2021-02-27 NOTE — Progress Notes (Addendum)
[facility] - Clinic Note  03/04/2021     CHIEF COMPLAINT Patient presents for Retina Follow Up   HISTORY OF PRESENT ILLNESS: Lindsay Harvey is a 85 y.o. female who presents to the clinic today for:   HPI    Retina Follow Up    Patient presents with  Wet AMD.  In right eye.  This started 10 weeks ago.  Since onset it is stable.  I, the attending physician,  performed the HPI with the patient and updated documentation appropriately.          Comments    Pt here for 10 week exu eval ARMD OU. Pt states vision is stable, no changes. No ocular pain or discomfort. She does notice some grittiness in OD that comes and goes. Noticed a fly in the vision of OD a few times. No flashes of light reported.        Last edited by Rennis Chris, MD on 03/04/2021  9:51 PM. (History)    pt states vision is stable, she is using AT's 3-4 times a day  Referring physician: Ralene Ok, MD [address] [place],  [place] [address]  HISTORICAL INFORMATION:   Selected notes from the MEDICAL RECORD NUMBER Referred by Dr. Marcha Solders for concern of AMD OU, CNV    CURRENT MEDICATIONS: Current Outpatient Medications (Ophthalmic Drugs)  Medication Sig  . carboxymethylcellulose (REFRESH PLUS) 0.5 % SOLN Place 1 drop into both eyes 3 (three) times daily as needed (dry/irritated eyes).   . prednisoLONE acetate (PRED FORTE) 1 % ophthalmic suspension Place 1 drop into the right eye 4 (four) times daily.  Marland Kitchen REFRESH OPTIVE MEGA-3 0.5-1-0.5 % SOLN Place 1 drop into both eyes 3 (three) times daily as needed (discomfort/eye irritation.).   No current facility-administered medications for this visit. (Ophthalmic Drugs)   Current Outpatient Medications (Other)  Medication Sig  . calcium carbonate (OSCAL) 1500 (600 Ca) MG TABS tablet Take 600 mg of elemental calcium by mouth 2 (two) times a day.  . Cholecalciferol (VITAMIN D3) 50 MCG (2000 UT) TABS Take 2,000 Units by mouth daily.  . famotidine  (PEPCID) 20 MG tablet Take 20 mg by mouth daily.  . methylPREDNISolone (MEDROL DOSEPAK) 4 MG TBPK tablet Use as directed  . Multiple Vitamins-Minerals (EMERGEN-C IMMUNE PO) Take 1 packet by mouth daily as needed (for immune support).  . Multiple Vitamins-Minerals (PRESERVISION AREDS 2 PO) Take 1 tablet by mouth 2 (two) times a day.   Marland Kitchen omeprazole (PRILOSEC) 40 MG capsule 1 cap(s)  . simvastatin (ZOCOR) 40 MG tablet Take 10 mg by mouth every evening. 1/4 tablet at night   Current Facility-Administered Medications (Other)  Medication Route  . Bevacizumab (AVASTIN) SOLN 1.25 mg Intravitreal  . Bevacizumab (AVASTIN) SOLN 1.25 mg Intravitreal  . Bevacizumab (AVASTIN) SOLN 1.25 mg Intravitreal  . Bevacizumab (AVASTIN) SOLN 1.25 mg Intravitreal  . Bevacizumab (AVASTIN) SOLN 1.25 mg Intravitreal  . Bevacizumab (AVASTIN) SOLN 1.25 mg Intravitreal  . Bevacizumab (AVASTIN) SOLN 1.25 mg Intravitreal  . Bevacizumab (AVASTIN) SOLN 1.25 mg Intravitreal  . Bevacizumab (AVASTIN) SOLN 1.25 mg Intravitreal  . Bevacizumab (AVASTIN) SOLN 1.25 mg Intravitreal  . Bevacizumab (AVASTIN) SOLN 1.25 mg Intravitreal  . Bevacizumab (AVASTIN) SOLN 1.25 mg Intravitreal  . Bevacizumab (AVASTIN) SOLN 1.25 mg Intravitreal      REVIEW OF SYSTEMS: ROS    Positive for: Gastrointestinal, Eyes   Negative for: Constitutional, Neurological, Skin, Genitourinary, Musculoskeletal, HENT, Endocrine, Cardiovascular, Respiratory, Psychiatric, Allergic/Imm, Heme/Lymph  Last edited by Thompson GrayerSimpson, Makenzie E, COT on 03/04/2021  9:23 AM. (History)       ALLERGIES No Known Allergies  PAST MEDICAL HISTORY Past Medical History:  Diagnosis Date  . Allergies   . 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 Headache   . Hypercholesterolemia   . Macular degeneration    Wet OU  . Wears glasses    Past Surgical History:  Procedure Laterality Date  . 25 GAUGE PARS PLANA VITRECTOMY WITH 20 GAUGE MVR PORT  FOR MACULAR HOLE Right 04/07/2019   Procedure: 25 GAUGE PARS PLANA VITRECTOMY WITH 20 GAUGE MVR PORT FOR MACULAR HOLE;  Surgeon: Rennis ChrisZamora, Sava Proby, MD;  [facility];  Service: Ophthalmology;  Laterality: Right;  . CATARACT EXTRACTION Bilateral    2013  . DILATION AND CURETTAGE OF UTERUS    . EYE SURGERY Bilateral 2013   Cat Sx  . GAS/FLUID EXCHANGE Right 04/07/2019   Procedure: Gas/Fluid Exchange;  Surgeon: Rennis ChrisZamora, Kamariah Fruchter, MD;  [facility];  Service: Ophthalmology;  Laterality: Right;  . MEMBRANE PEEL Right 04/07/2019   Procedure: Eula FlaxMembrane Peel;  Surgeon: Rennis ChrisZamora, Dealie Koelzer, MD;  [facility];  Service: Ophthalmology;  Laterality: Right;  . PHOTOCOAGULATION WITH LASER Right 04/07/2019   Procedure: Photocoagulation With Laser;  Surgeon: Rennis ChrisZamora, Brandyce Dimario, MD;  [facility];  Service: Ophthalmology;  Laterality: Right;  . YAG LASER APPLICATION Bilateral     FAMILY HISTORY Family History  Problem Relation Age of Onset  . Stroke Father     SOCIAL HISTORY Social History   Tobacco Use  . Smoking status: Never Smoker  . Smokeless tobacco: Never Used  Vaping Use  . Vaping Use: Never used  Substance Use Topics  . Alcohol use: No  . Drug use: No         OPHTHALMIC EXAM:  Base Eye Exam    Visual Acuity (Snellen - Linear)      Right Left   Dist [place] 20/50 20/60   Dist ph [place] 20/40 -1 20/50       Tonometry (Tonopen, 9:34 AM)      Right Left   Pressure 10 10       Pupils      Dark Light Shape React APD   Right 3 2 Round Minimal None   Left 3 2 Round Minimal None       Visual Fields (Counting fingers)      Left Right    Full Full       Extraocular Movement      Right Left    Full, Ortho Full, Ortho       Neuro/Psych    Oriented x3: Yes   Mood/Affect: Normal       Dilation    Both eyes: 1.0% Mydriacyl, 2.5% Phenylephrine @ 9:34 AM        Slit Lamp and Fundus Exam    Slit Lamp Exam      Right Left   Lids/Lashes Dermatochalasis - upper lid, periorbital edema  Dermatochalasis - upper lid   Conjunctiva/Sclera White and quiet White and quiet   Cornea Arcus, trace Punctate epithelial erosions, mild endo pigment Arcus, 1+ Punctate epithelial erosions, mild tear film debris, mild endo pigment   Anterior Chamber Deep, quiet Deep and quiet   Iris Round and dilated Round and dilated   Lens PC IOL in good postion with anterior capsular phimosis, 1+ Posterior capsular opacification PC IOL in good postion with  anterior capsular phimosis, 1+ Posterior capsular opacification sparing center   Vitreous post vitrectomy Vitreous syneresis       Fundus Exam      Right Left   Disc Sharp rim, mild pallor, peripapillary drusen, PPP Pink and Sharp, +cupping   C/D Ratio 0.5 0.75   Macula flat; ERM/VMT gone, blunted foveal reflex, refractile drusen, trace cystic changes, pigment clumping, no heme or edema Blunted foveal reflex, refractile Drusen, early Atrophy, RPE mottling and clumping, +PEDs/CNV, +edema/IRF -- persistent, no heme   Vessels attenuated, mild tortuousity Vascular attenuation, mild Tortuousity   Periphery Attached, scattered peripheral drusen, laser changes superiorly Attached with peripheral drusen          IMAGING AND PROCEDURES  Imaging and Procedures for 02/09/18  OCT, Retina - OU - Both Eyes       Right Eye Quality was good. Central Foveal Thickness: 217. Progression has been stable. Findings include retinal drusen , outer retinal atrophy, intraretinal hyper-reflective material, abnormal foveal contour, pigment epithelial detachment, no SRF, no IRF (stable improvement in cystic changes ).   Left Eye Quality was good. Central Foveal Thickness: 276. Progression has been stable. Findings include vitreous traction, pigment epithelial detachment, abnormal foveal contour, retinal drusen , epiretinal membrane, outer retinal atrophy, no SRF, intraretinal fluid (persistent VMT w/ cystic changes, no active exudative disease).   Notes Images taken,  stored on drive  Diagnosis / Impression:  OD: Exudative ARMD; stable improvement in cystic changes  OS: exudative ARMD-- no active exudative disease; persistent VMT w/ central cystic changes   Clinical management:  See below  Abbreviations: NFP - Normal foveal profile. CME - cystoid macular edema. PED - pigment epithelial detachment. IRF - intraretinal fluid. SRF - subretinal fluid. EZ - ellipsoid zone. ERM - epiretinal membrane. ORA - outer retinal atrophy. ORT - outer retinal tubulation. SRHM - subretinal hyper-reflective material         Intravitreal Injection, Pharmacologic Agent - OD - Right Eye       Time Out 03/04/2021. 11:01 AM. Confirmed correct patient, procedure, site, and patient consented.   Anesthesia Topical anesthesia was used. Anesthetic medications included Lidocaine 2%, Proparacaine 0.5%.   Procedure Preparation included 5% betadine to ocular surface, eyelid speculum. A supplied needle was used.   Injection:  1.25 mg Bevacizumab (AVASTIN) 1.25mg /0.67mL SOLN   NDC: P3213405, Lot: [id number], Expiration date: 03/30/2021   Route: Intravitreal, Site: Right Eye, Waste: 0 mL  Post-op Post injection exam found visual acuity of at least counting fingers. The patient tolerated the procedure well. There were no complications. The patient received written and verbal post procedure care education. Post injection medications were not given.        Intravitreal Injection, Pharmacologic Agent - OS - Left Eye       Time Out 03/04/2021. 11:05 AM. Confirmed correct patient, procedure, site, and patient consented.   Anesthesia Topical anesthesia was used. Anesthetic medications included Lidocaine 2%, Proparacaine 0.5%.   Procedure Preparation included eyelid speculum, 5% betadine to ocular surface. A (32g) needle was used.   Injection:  1.25 mg Bevacizumab (AVASTIN) 1.25mg /0.32mL SOLN   NDC: 84665-993-57, Lot: [id number], Expiration date: 04/24/2021   Route:  Intravitreal, Site: Left Eye, Waste: 0.05 mL  Post-op Post injection exam found visual acuity of at least counting fingers. The patient tolerated the procedure well. There were no complications. The patient received written and verbal post procedure care education. Post injection medications were not given.  ASSESSMENT/PLAN:    ICD-10-CM   1. 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 Exudative age-related macular degeneration of left eye with active choroidal neovascularization (HCC)  H35.3221 Intravitreal Injection, Pharmacologic Agent - OS - Left Eye    Bevacizumab (AVASTIN) SOLN 1.25 mg  3. 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 Retinal edema  H35.81 OCT, Retina - OU - Both Eyes  5. Pseudophakia of both eyes  Z96.1     1. 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    - S/P IVA OD #1 (12.03.18), #2 (01.02.19), #3 (01.30.19), #4 (03.01.19), #5 (04.01.19), #6 (04.30.19), #7 (06.05.19), #8 (07.19.19), #9 (08.30.19), #10 (10.14.19), #11 (11.18.19), #12 (12.30.19), #13 (02.10.20), #14 (04.06.20), #15 (09.17.20), #16 (10.15.20), #17 (12.01.20), #18 (01.18.21), #19 (03.15.21), #20 (5.11.21), #21 (07.20.21), #22 (10.14.21), #23 (01.10.22), #24 (03.07.22)  - has had good response to IVA -- has been stable on 3 month interval  - FA (04.01.19) shows mild CNVM OD consistent with exudative ARMD  - OCT shows stable improvement in cystic changes OD at 11 wks  - BCVA stable at 20/40   - recommend IVA OD #25 today, 05.23.22 -- maintenance w/ f/u in 12 wks to coordinate with OS  - pt wishes to proceed  - RBA of procedure discussed, questions answered  - informed consent obtained and signed  - see procedure note  - Avastin informed consent form signed and  scanned on 01.18.2021  - f/u 12 wks -- DFE/OCT/possible injection   2. Age related macular degeneration, exudative, OS  - interval conversion from non-exudative to exudative noted 07.20.21  - s/p IVA OS #1 (07.20.21), #2 (08.19.21), #3 (09.16.21), #4 (10.14.21), #5 (11.29.21), #6 (01.10.22), #7 (03.07.22)  - exam shows focal heme overlying PEDs temporal macula stably resolved  - OCT with persistent VMT and cystic changes, no active exudative disease at 11 wk interval  - BCVA 20/50   - recommend IVA OS #8 today, 05.25.22 -- maintenance w/ 12 wk interval  - pt wishes to proceed with injection  - RBA of procedure discussed, questions answered  - informed consent obtained, signed and scanned, 07.20.21  - see procedure note  - f/u 12 weeks, DFE, OCT, possible injection, tx and ext as able  3,4. 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 Pseudophakia OU  - s/p CE/IOL OU  - s/p YAG OU  - beautiful surgeries, doing well  - monitor  Ophthalmic Meds Ordered this visit:  Meds ordered this encounter  Medications  . Bevacizumab (AVASTIN) SOLN 1.25 mg  . Bevacizumab (AVASTIN) SOLN 1.25 mg       Return in about 12 weeks (around 05/27/2021) for f/u exu ARMD OS, DFE, OCT.  There are no Patient Instructions on file for  this visit.  This document serves as a record of services personally performed by Karie Chimera, MD, PhD. It was created on their behalf by Herby Abraham, COA, an ophthalmic technician. The creation of this record is the provider's dictation and/or activities during the visit.    Electronically signed by: Herby Abraham, COA @ 10:04 PM    This document serves as a record of services personally performed by Karie Chimera, MD, PhD. It was created on their behalf by Glee Arvin. Manson Passey, OA an ophthalmic technician. The creation of this record is the provider's dictation and/or activities during the visit.    Electronically signed by: Glee Arvin. Manson Passey, [place] 05.23.2022 10:04 PM  Karie Chimera, M.D., Ph.D. Diseases & Surgery of the Retina and Vitreous [facility] 03/04/2021   I have reviewed the above documentation for accuracy and completeness, and I agree with the above. Karie Chimera, M.D., Ph.D. 03/04/21 10:04 PM  Abbreviations: M myopia (nearsighted); A astigmatism; H hyperopia (farsighted); P presbyopia; Mrx spectacle prescription;  CTL contact lenses; OD right eye; OS left eye; OU both eyes  XT exotropia; ET esotropia; PEK punctate epithelial keratitis; PEE punctate epithelial erosions; DES dry eye syndrome; MGD meibomian gland dysfunction; ATs artificial tears; PFAT's preservative free artificial tears; NSC nuclear sclerotic cataract; PSC posterior subcapsular cataract; ERM epi-retinal membrane; PVD posterior vitreous detachment; RD retinal detachment; DM diabetes mellitus; DR diabetic retinopathy; NPDR non-proliferative diabetic retinopathy; PDR proliferative diabetic retinopathy; CSME clinically significant macular edema; DME diabetic macular edema; dbh dot blot hemorrhages; CWS cotton wool spot; POAG primary open angle glaucoma; C/D cup-to-disc ratio; HVF humphrey visual field; GVF goldmann visual field; OCT optical coherence tomography; IOP intraocular pressure; BRVO Branch retinal vein occlusion; CRVO central retinal vein occlusion; CRAO central retinal artery occlusion; BRAO branch retinal artery occlusion; RT retinal tear; SB scleral buckle; PPV pars plana vitrectomy; VH Vitreous hemorrhage; PRP panretinal laser photocoagulation; IVK intravitreal kenalog; VMT vitreomacular traction; MH Macular hole;  NVD  neovascularization of the disc; NVE neovascularization elsewhere; AREDS age related eye disease study; ARMD age related macular degeneration; POAG primary open angle glaucoma; EBMD epithelial/anterior basement membrane dystrophy; ACIOL anterior chamber intraocular lens; IOL intraocular lens; PCIOL posterior chamber intraocular lens; Phaco/IOL phacoemulsification with intraocular lens placement; PRK photorefractive keratectomy; LASIK laser assisted in situ keratomileusis; HTN hypertension; DM diabetes mellitus; COPD chronic obstructive pulmonary disease

## 2021-03-04 ENCOUNTER — Other Ambulatory Visit: Payer: Self-pay

## 2021-03-04 ENCOUNTER — Ambulatory Visit (INDEPENDENT_AMBULATORY_CARE_PROVIDER_SITE_OTHER): Payer: Medicare HMO | Admitting: Ophthalmology

## 2021-03-04 ENCOUNTER — Encounter (INDEPENDENT_AMBULATORY_CARE_PROVIDER_SITE_OTHER): Payer: Self-pay | Admitting: Ophthalmology

## 2021-03-04 DIAGNOSIS — H3581 Retinal edema: Secondary | ICD-10-CM

## 2021-03-04 DIAGNOSIS — Z961 Presence of intraocular lens: Secondary | ICD-10-CM

## 2021-03-04 DIAGNOSIS — H43392 Other vitreous opacities, left eye: Secondary | ICD-10-CM

## 2021-03-04 DIAGNOSIS — H43823 Vitreomacular adhesion, bilateral: Secondary | ICD-10-CM

## 2021-03-04 DIAGNOSIS — H43821 Vitreomacular adhesion, right eye: Secondary | ICD-10-CM

## 2021-03-04 DIAGNOSIS — H353211 Exudative age-related macular degeneration, right eye, with active choroidal neovascularization: Secondary | ICD-10-CM | POA: Diagnosis not present

## 2021-03-04 DIAGNOSIS — H43393 Other vitreous opacities, bilateral: Secondary | ICD-10-CM

## 2021-03-04 DIAGNOSIS — H353221 Exudative age-related macular degeneration, left eye, with active choroidal neovascularization: Secondary | ICD-10-CM | POA: Diagnosis not present

## 2021-03-04 DIAGNOSIS — H353121 Nonexudative age-related macular degeneration, left eye, early dry stage: Secondary | ICD-10-CM

## 2021-03-04 MED ORDER — BEVACIZUMAB CHEMO INJECTION 1.25MG/0.05ML SYRINGE FOR KALEIDOSCOPE
1.2500 mg | INTRAVITREAL | Status: AC | PRN
  Administered 2021-03-04: 1.25 mg via INTRAVITREAL

## 2021-03-07 ENCOUNTER — Other Ambulatory Visit: Payer: Self-pay | Admitting: Internal Medicine

## 2021-03-07 ENCOUNTER — Ambulatory Visit
Admission: RE | Admit: 2021-03-07 | Discharge: 2021-03-07 | Disposition: A | Discharge status: Home / Self Care | Payer: Medicare HMO | Source: Ambulatory Visit | Attending: Internal Medicine | Admitting: Internal Medicine

## 2021-03-07 DIAGNOSIS — R079 Chest pain, unspecified: Secondary | ICD-10-CM

## 2021-06-10 NOTE — Progress Notes (Signed)
[facility] Note  06/13/2021     CHIEF COMPLAINT Patient presents for Retina Follow Up   HISTORY OF PRESENT ILLNESS: Lindsay Harvey is a 85 y.o. 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  Duration of 14 weeks.  Since onset it is stable.  I, the attending physician,  performed the HPI with the patient and updated documentation appropriately.        Comments   14 week follow up Exu ARMD OD-  On occasion things move around in vision OD.  Using Ats 2-3 times a day.       Last edited by Lindsay Caffey, MD on 06/13/2021 12:21 PM.     pt states   Referring physician: [name], MD [id number],  [place] [address]  HISTORICAL INFORMATION:   Selected notes from the MEDICAL RECORD NUMBER Referred by Lindsay Harvey for concern of AMD OU, CNV    CURRENT MEDICATIONS: Current Outpatient Medications (Ophthalmic Drugs)  Medication Sig   carboxymethylcellulose (REFRESH PLUS) 0.5 % SOLN Place 1 drop into both eyes 3 (three) times daily as needed (dry/irritated eyes).    REFRESH OPTIVE MEGA-3 0.5-1-0.5 % SOLN Place 1 drop into both eyes 3 (three) times daily as needed (discomfort/eye irritation.).   prednisoLONE acetate (PRED FORTE) 1 % ophthalmic suspension Place 1 drop into the right eye 4 (four) times daily. (Patient not taking: Reported on 06/13/2021)   No current facility-administered medications for this visit. (Ophthalmic Drugs)   Current Outpatient Medications (Other)  Medication Sig   calcium carbonate (OSCAL) 1500 (600 Ca) MG TABS tablet Take 600 mg of elemental calcium by mouth 2 (two) times a day.   Cholecalciferol (VITAMIN D3) 50 MCG (2000 UT) TABS Take 2,000 Units by mouth daily.   famotidine (PEPCID) 20 MG tablet Take 20 mg by mouth daily.   methylPREDNISolone (MEDROL DOSEPAK) 4 MG TBPK tablet Use as directed   Multiple Vitamins-Minerals (EMERGEN-C IMMUNE PO)  Take 1 packet by mouth daily as needed (for immune support).   Multiple Vitamins-Minerals (PRESERVISION AREDS 2 PO) Take 1 tablet by mouth 2 (two) times a day.    omeprazole (PRILOSEC) 40 MG capsule 1 cap(s)   simvastatin (ZOCOR) 40 MG tablet Take 10 mg by mouth every evening. 1/4 tablet at night   Current Facility-Administered Medications (Other)  Medication Route   Bevacizumab (AVASTIN) SOLN 1.25 mg Intravitreal   Bevacizumab (AVASTIN) SOLN 1.25 mg Intravitreal   Bevacizumab (AVASTIN) SOLN 1.25 mg Intravitreal   Bevacizumab (AVASTIN) SOLN 1.25 mg Intravitreal   Bevacizumab (AVASTIN) SOLN 1.25 mg Intravitreal   Bevacizumab (AVASTIN) SOLN 1.25 mg Intravitreal   Bevacizumab (AVASTIN) SOLN 1.25 mg Intravitreal   Bevacizumab (AVASTIN) SOLN 1.25 mg Intravitreal   Bevacizumab (AVASTIN) SOLN 1.25 mg Intravitreal   Bevacizumab (AVASTIN) SOLN 1.25 mg Intravitreal   Bevacizumab (AVASTIN) SOLN 1.25 mg Intravitreal   Bevacizumab (AVASTIN) SOLN 1.25 mg Intravitreal   Bevacizumab (AVASTIN) SOLN 1.25 mg Intravitreal    REVIEW OF SYSTEMS: ROS   Positive for: Gastrointestinal, Eyes Negative for: Constitutional, Neurological, Skin, Genitourinary, Musculoskeletal, HENT, Endocrine, Cardiovascular, Respiratory, Psychiatric, Allergic/Imm, Heme/Lymph Last edited by Lindsay Harvey, COA on 06/13/2021  8:21 AM.     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   Headache    Hypercholesterolemia    Macular degeneration    Wet OU   Wears glasses    Past Surgical History:  Procedure Laterality Date   25 GAUGE PARS PLANA VITRECTOMY WITH 20 GAUGE MVR PORT FOR MACULAR HOLE Right 04/07/2019   Procedure: 25 GAUGE PARS PLANA VITRECTOMY WITH 20 GAUGE MVR PORT FOR MACULAR HOLE;  Surgeon: Lindsay Caffey, MD;  Location: [facility];  Service: Ophthalmology;  Laterality: Right;   CATARACT  EXTRACTION Bilateral    2013   DILATION AND CURETTAGE OF UTERUS     EYE SURGERY Bilateral 2013   Cat Sx   GAS/FLUID EXCHANGE Right 04/07/2019   Procedure: Gas/Fluid Exchange;  Surgeon: Lindsay Caffey, MD;  Location: [place];  Service: Ophthalmology;  Laterality: Right;   MEMBRANE PEEL Right 04/07/2019   Procedure: [name];  Surgeon: Lindsay Caffey, MD;  Location: [place];  Service: Ophthalmology;  Laterality: Right;   PHOTOCOAGULATION WITH LASER Right 04/07/2019   Procedure: Photocoagulation With Laser;  Surgeon: Lindsay Caffey, MD;  Location: [facility];  Service: Ophthalmology;  Laterality: Right;   YAG LASER APPLICATION Bilateral     FAMILY HISTORY Family History  Problem Relation Age of Onset   Stroke Father     SOCIAL HISTORY Social History   Tobacco Use   Smoking status: Never   Smokeless tobacco: Never  Vaping Use   Vaping Use: Never used  Substance Use Topics   Alcohol use: No   Drug use: No         OPHTHALMIC EXAM:  Base Eye Exam     Visual Acuity (Snellen - Linear)       Right Left   [place] 20/80 +2 20/70 -2   Dist ph [place] NI 20/60 -2         Tonometry (Tonopen, 8:37 AM)       Right Left   Pressure 14 16         Pupils       Dark Light Shape React APD   Right 3 2 Round Slow None   Left 3 2 Round Slow None         Visual Fields (Counting fingers)       Left Right    Full Full         Extraocular Movement       Right Left    Full Full         Neuro/Psych     Oriented x3: Yes   Mood/Affect: Normal         Dilation     Both eyes: 1.0% Mydriacyl, 2.5% Phenylephrine @ 8:37 AM           Slit Lamp and Fundus Exam     Slit Lamp Exam       Right Left   Lids/Lashes Dermatochalasis - upper lid, periorbital edema Dermatochalasis - upper lid   Conjunctiva/Sclera White and quiet White and quiet   Cornea Arcus, trace Punctate epithelial erosions, mild endo pigment Arcus, 1+ Punctate epithelial erosions, mild tear film debris,  mild endo pigment   Anterior Chamber Deep, quiet Deep and quiet   Iris Round and dilated Round and dilated   Lens PC IOL in good postion with anterior capsular phimosis, 1+ Posterior capsular opacification PC IOL in good postion with anterior capsular phimosis, 1+ Posterior capsular opacification sparing center   Vitreous post vitrectomy Vitreous syneresis  Fundus Exam       Right Left   Disc Sharp rim, mild pallor, peripapillary drusen, PPP Pink and Sharp, +cupping   C/D Ratio 0.5 0.75   Macula flat; ERM/VMT gone, blunted foveal reflex, refractile drusen, trace cystic changes - slightly increased, pigment clumping, no heme or edema Blunted foveal reflex, refractile Drusen, early Atrophy, RPE mottling and clumping, +PEDs/CNV, +edema/IRF -- persistent, no heme   Vessels attenuated, mild tortuousity Vascular attenuation, mild Tortuousity   Periphery Attached, scattered peripheral drusen, laser changes superiorly Attached with peripheral drusen           Refraction     Manifest Refraction       Sphere Cylinder Axis Dist VA   Right -0.50 +0.75 180 20/50   Left -0.75 +0.75 005 20/60+1            IMAGING AND PROCEDURES  Imaging and Procedures for 02/09/18  OCT, Retina - OU - Both Eyes       Right Eye Quality was good. Central Foveal Thickness: 231. Progression has worsened. Findings include retinal drusen , outer retinal atrophy, intraretinal hyper-reflective material, abnormal foveal contour, pigment epithelial detachment, no SRF, intraretinal fluid (Mild interval increase in cystic changes/IRF IT mac).   Left Eye Quality was good. Central Foveal Thickness: 288. Progression has been stable. Findings include vitreous traction, pigment epithelial detachment, abnormal foveal contour, retinal drusen , epiretinal membrane, outer retinal atrophy, no SRF, intraretinal fluid (persistent VMT w/ cystic changes, no active exudative disease).   Notes Images taken, stored on  drive  Diagnosis / Impression:  OD: Exudative ARMD -- Mild interval increase in cystic changes/IRF IT mac  OS: exudative ARMD-- no active exudative disease; persistent VMT w/ central cystic changes   Clinical management:  See below  Abbreviations: NFP - Normal foveal profile. CME - cystoid macular edema. PED - pigment epithelial detachment. IRF - intraretinal fluid. SRF - subretinal fluid. EZ - ellipsoid zone. ERM - epiretinal membrane. ORA - outer retinal atrophy. ORT - outer retinal tubulation. SRHM - subretinal hyper-reflective material       Intravitreal Injection, Pharmacologic Agent - OD - Right Eye       Time Out 06/13/2021. 9:09 AM. Confirmed correct patient, procedure, site, and patient consented.   Anesthesia Topical anesthesia was used. Anesthetic medications included Lidocaine 2%, Proparacaine 0.5%.   Procedure Preparation included 5% betadine to ocular surface, eyelid speculum. A supplied needle was used.   Injection: 1.25 mg Bevacizumab 1.34m/0.05ml   Route: Intravitreal, Site: Right Eye   NDC:: 49702-637-85 Lot: [id number] , Expiration date: 07/10/2021, Waste: 0 mL   Post-op Post injection exam found visual acuity of at least counting fingers. The patient tolerated the procedure well. There were no complications. The patient received written and verbal post procedure care education. Post injection medications were not given.      Intravitreal Injection, Pharmacologic Agent - OS - Left Eye       Time Out 06/13/2021. 9:10 AM. Confirmed correct patient, procedure, site, and patient consented.   Anesthesia Topical anesthesia was used. Anesthetic medications included Lidocaine 2%, Proparacaine 0.5%.   Procedure Preparation included eyelid speculum, 5% betadine to ocular surface. A (32g) needle was used.   Injection: 1.25 mg Bevacizumab 1.264m0.05ml   Route: Intravitreal, Site: Left Eye   NDC: [id number]: : [id number] date: 07/02/2021, Waste:  0.05 mL   Post-op Post injection exam found visual acuity of at least counting fingers. The patient tolerated the procedure well. There were no complications. The patient  received written and verbal post procedure care education. Post injection medications were not given.            ASSESSMENT/PLAN:    ICD-10-CM   1. Exudative age-related macular degeneration of right eye with active choroidal neovascularization (HCC)  H35.3211 Intravitreal Injection, Pharmacologic Agent - OD - Right Eye    Bevacizumab (AVASTIN) SOLN 1.25 mg    2. Exudative age-related macular degeneration of left eye with active choroidal neovascularization (HCC)  H35.3221 Intravitreal Injection, Pharmacologic Agent - OS - Left Eye    Bevacizumab (AVASTIN) SOLN 1.25 mg    3. 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 Retinal edema  H35.81 OCT, Retina - OU - Both Eyes    5. Pseudophakia of both eyes  Z96.1       1. 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    - S/P IVA OD #1 (12.03.18), #2 (01.02.19), #3 (01.30.19), #4 (03.01.19), #5 (04.01.19), #6 (04.30.19), #7 (06.05.19), #8 (07.19.19), #9 (08.30.19), #10 (10.14.19), #11 (11.18.19), #12 (12.30.19), #13 (02.10.20), #14 (04.06.20), #15 (09.17.20), #16 (10.15.20), #17 (12.01.20), #18 (01.18.21), #19 (03.15.21), #20 (5.11.21), #21 (07.20.21), #22 (10.14.21), #23 (01.10.22), #24 (03.07.22), #24 (05.23.22)  - has had good response to IVA -- has been stable on 3 month interval  - FA (04.01.19) shows mild CNVM OD consistent with exudative ARMD  - OCT shows Mild interval increase in cystic changes/IRF IT mac OD at 14 wks  - BCVA decreased to 20/80 from 20/40   - recommend IVA OD #26 today, 09.01.22 -- with follow up decreased to 8-9 weeks  - pt wishes to proceed  - RBA of procedure discussed, questions answered  - informed consent obtained and signed  - see procedure note  - Avastin informed consent form signed  and scanned on 01.18.2021  - f/u 8-9 wks -- DFE/OCT/possible injection   2. Age related macular degeneration, exudative, OS  - interval conversion from non-exudative to exudative noted 07.20.21  - s/p IVA OS #1 (07.20.21), #2 (08.19.21), #3 (09.16.21), #4 (10.14.21), #5 (11.29.21), #6 (01.10.22), #7 (03.07.22), #8 (05.23.22)  - exam shows focal heme overlying PEDs temporal macula stably resolved  - OCT with persistent VMT and cystic changes, no active exudative disease at 14 wk interval  - BCVA decreased to 20/60 from 20/50   - recommend IVA OS #9 today, 09.01.22 -- with follow up decreased to 8-9 weeks  - pt wishes to proceed with injection  - RBA of procedure discussed, questions answered  - informed consent obtained, signed and scanned, 07.20.21  - see procedure note  - f/u 8-9 weeks, DFE, OCT, possible injection, tx and ext as able  3,4. 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 Pseudophakia OU  - s/p CE/IOL OU  - s/p YAG OU  - beautiful surgeries, doing well  - monitor  Ophthalmic Meds Ordered this visit:  Meds ordered this  encounter  Medications   Bevacizumab (AVASTIN) SOLN 1.25 mg   Bevacizumab (AVASTIN) SOLN 1.25 mg       Return for f/u 8-9 weeks, exu ARMD OU, DFE, OCT.  There are no Patient Instructions on file for this visit.  This document serves as a record of services personally performed by Gardiner Sleeper, MD, PhD. It was created on their behalf by San Jetty. Owens Shark, OA an ophthalmic technician. The creation of this record is the provider's dictation and/or activities during the visit.    Electronically signed by: San Jetty. Marguerita Merles 08.29.2022 12:27 PM   Gardiner Sleeper, M.D., Ph.D. Diseases & Surgery of the Retina and Vitreous [facility]  I have reviewed the above documentation for accuracy and completeness, and I agree with the above. Gardiner Sleeper, M.D., Ph.D. 06/13/21 12:27 PM   Abbreviations: M myopia (nearsighted); A astigmatism; H hyperopia (farsighted); P presbyopia; Mrx spectacle prescription;  CTL contact lenses; OD right eye; OS left eye; OU both eyes  XT exotropia; ET esotropia; PEK punctate epithelial keratitis; PEE punctate epithelial erosions; DES dry eye syndrome; MGD meibomian gland dysfunction; ATs artificial tears; PFAT's preservative free artificial tears; [place] nuclear sclerotic cataract; PSC posterior subcapsular cataract; ERM epi-retinal membrane; PVD posterior vitreous detachment; RD retinal detachment; DM diabetes mellitus; DR diabetic retinopathy; NPDR non-proliferative diabetic retinopathy; PDR proliferative diabetic retinopathy; CSME clinically significant macular edema; DME diabetic macular edema; dbh dot blot hemorrhages; CWS cotton wool spot; POAG primary open angle glaucoma; C/D cup-to-disc ratio; HVF humphrey visual field; GVF goldmann visual field; OCT optical coherence tomography; IOP intraocular pressure; BRVO Branch retinal vein occlusion; CRVO central retinal vein occlusion; CRAO central retinal artery occlusion; BRAO branch retinal artery occlusion; RT retinal tear; SB scleral buckle; PPV pars plana vitrectomy; VH Vitreous hemorrhage; PRP panretinal laser photocoagulation; IVK intravitreal kenalog; VMT vitreomacular traction; MH Macular hole;  NVD neovascularization of the disc; NVE neovascularization elsewhere; AREDS age related eye disease study; ARMD age related macular degeneration; POAG primary open angle glaucoma; EBMD epithelial/anterior basement membrane dystrophy; ACIOL anterior chamber intraocular lens; IOL intraocular lens; PCIOL posterior chamber  intraocular lens; Phaco/IOL phacoemulsification with intraocular lens placement; [place] photorefractive keratectomy; LASIK laser assisted in situ keratomileusis; HTN hypertension; DM diabetes mellitus; COPD chronic obstructive pulmonary disease

## 2021-06-13 ENCOUNTER — Ambulatory Visit (INDEPENDENT_AMBULATORY_CARE_PROVIDER_SITE_OTHER): Payer: Medicare HMO | Admitting: Ophthalmology

## 2021-06-13 ENCOUNTER — Other Ambulatory Visit: Payer: Self-pay

## 2021-06-13 ENCOUNTER — Encounter (INDEPENDENT_AMBULATORY_CARE_PROVIDER_SITE_OTHER): Payer: Self-pay | Admitting: Ophthalmology

## 2021-06-13 DIAGNOSIS — Z961 Presence of intraocular lens: Secondary | ICD-10-CM

## 2021-06-13 DIAGNOSIS — H353221 Exudative age-related macular degeneration, left eye, with active choroidal neovascularization: Secondary | ICD-10-CM

## 2021-06-13 DIAGNOSIS — H353231 Exudative age-related macular degeneration, bilateral, with active choroidal neovascularization: Secondary | ICD-10-CM | POA: Diagnosis not present

## 2021-06-13 DIAGNOSIS — H43823 Vitreomacular adhesion, bilateral: Secondary | ICD-10-CM | POA: Diagnosis not present

## 2021-06-13 DIAGNOSIS — H3581 Retinal edema: Secondary | ICD-10-CM

## 2021-06-13 DIAGNOSIS — H353211 Exudative age-related macular degeneration, right eye, with active choroidal neovascularization: Secondary | ICD-10-CM

## 2021-06-13 MED ORDER — BEVACIZUMAB CHEMO INJECTION 1.25MG/0.05ML SYRINGE FOR KALEIDOSCOPE
1.2500 mg | INTRAVITREAL | Status: AC | PRN
  Administered 2021-06-13: 1.25 mg via INTRAVITREAL

## 2021-08-07 NOTE — Progress Notes (Addendum)
[facility] Note  08/13/2021     CHIEF COMPLAINT Patient presents for Retina Follow Up   HISTORY OF PRESENT ILLNESS: Lindsay Harvey is a 85 y.o. female who presents to the clinic today for:   HPI     Retina Follow Up   Patient presents with  Dry AMD.  In right eye.  This started weeks ago.  Severity is moderate.  Duration of weeks.  Since onset it is stable.  I, the attending physician,  performed the HPI with the patient and updated documentation appropriately.        Comments   Pt states vision is the same OU.  Pt denies eye pain or discomfort and denies new or worsening floaters or fol OU.      Last edited by [name], MD on 08/13/2021  9:11 AM.    Pt's eyes feel dry.  Uses lots of AT and Omega 3s.  Referring physician: Hortencia Pilar, MD [place],   [id number]  HISTORICAL INFORMATION:   Selected notes from the MEDICAL RECORD NUMBER Referred by Dr. [name] for concern of AMD OU, CNV   CURRENT MEDICATIONS: Current Outpatient Medications (Ophthalmic Drugs)  Medication Sig   carboxymethylcellulose (REFRESH PLUS) 0.5 % SOLN Place 1 drop into both eyes 3 (three) times daily as needed (dry/irritated eyes).    prednisoLONE acetate (PRED FORTE) 1 % ophthalmic suspension Place 1 drop into the right eye 4 (four) times daily. (Patient not taking: Reported on 06/13/2021)   REFRESH OPTIVE MEGA-3 0.5-1-0.5 % SOLN Place 1 drop into both eyes 3 (three) times daily as needed (discomfort/eye irritation.).   No current facility-administered medications for this visit. (Ophthalmic Drugs)   Current Outpatient Medications (Other)  Medication Sig   calcium carbonate (OSCAL) 1500 (600 Ca) MG TABS tablet Take 600 mg of elemental calcium by mouth 2 (two) times a day.   Cholecalciferol (VITAMIN D3) 50 MCG (2000 UT) TABS Take 2,000 Units by mouth daily.   famotidine (PEPCID) 20 MG tablet Take 20 mg by mouth daily.    methylPREDNISolone (MEDROL DOSEPAK) 4 MG TBPK tablet Use as directed   Multiple Vitamins-Minerals (EMERGEN-C IMMUNE PO) Take 1 packet by mouth daily as needed (for immune support).   Multiple Vitamins-Minerals (PRESERVISION AREDS 2 PO) Take 1 tablet by mouth 2 (two) times a day.    omeprazole (PRILOSEC) 40 MG capsule 1 cap(s)   simvastatin (ZOCOR) 40 MG tablet Take 10 mg by mouth every evening. 1/4 tablet at night   Current Facility-Administered Medications (Other)  Medication Route   Bevacizumab (AVASTIN) SOLN 1.25 mg Intravitreal   Bevacizumab (AVASTIN) SOLN 1.25 mg Intravitreal   Bevacizumab (AVASTIN) SOLN 1.25 mg Intravitreal   Bevacizumab (AVASTIN) SOLN 1.25 mg Intravitreal   Bevacizumab (AVASTIN) SOLN 1.25 mg Intravitreal   Bevacizumab (AVASTIN) SOLN 1.25 mg Intravitreal   Bevacizumab (AVASTIN) SOLN 1.25 mg Intravitreal   Bevacizumab (AVASTIN) SOLN 1.25 mg Intravitreal   Bevacizumab (AVASTIN) SOLN 1.25 mg Intravitreal   Bevacizumab (AVASTIN) SOLN 1.25 mg Intravitreal   Bevacizumab (AVASTIN) SOLN 1.25 mg Intravitreal   Bevacizumab (AVASTIN) SOLN 1.25 mg Intravitreal   Bevacizumab (AVASTIN) SOLN 1.25 mg Intravitreal   REVIEW OF SYSTEMS: ROS   Positive for: Gastrointestinal, Eyes Negative for: Constitutional, Neurological, Skin, Genitourinary, Musculoskeletal, HENT, Endocrine, Cardiovascular, Respiratory, Psychiatric, Allergic/Imm, Heme/Lymph Last edited by Doneen Poisson on 08/13/2021  8:29 AM.     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;  Service: Ophthalmology;  Laterality: Right;   CATARACT EXTRACTION Bilateral    2013   DILATION AND CURETTAGE OF UTERUS     EYE SURGERY Bilateral 2013   Cat Sx   GAS/FLUID EXCHANGE Right 04/07/2019   Procedure: Gas/Fluid Exchange;  Surgeon: [name], MD;  Location: [facility];  Service: Ophthalmology;  Laterality: Right;   MEMBRANE PEEL Right 04/07/2019   Procedure: [name];  Surgeon: [name], MD;  Location: [facility];  Service: Ophthalmology;  Laterality: Right;   PHOTOCOAGULATION WITH LASER Right 04/07/2019   Procedure: Photocoagulation With Laser;  Surgeon: [name], MD;  Location: [facility];  Service: Ophthalmology;  Laterality: Right;   YAG LASER APPLICATION Bilateral    FAMILY HISTORY Family History  Problem Relation Age of Onset   Stroke Father    SOCIAL HISTORY Social History   Tobacco Use   Smoking status: Never   Smokeless tobacco: Never  Vaping Use   Vaping Use: Never used  Substance Use Topics   Alcohol use: No   Drug use: No       OPHTHALMIC EXAM: Base Eye Exam     Visual Acuity (Snellen - Linear)       Right Left   Dist cc 20/60 +1 20/40 -2   Dist ph cc NI NI    Correction: Glasses         Tonometry (Tonopen, 8:34 AM)       Right Left   Pressure 11 09         Pupils       Dark Light Shape React APD   Right 3 2 Round Minimal 0   Left 3 2 Round Minimal 0         Visual Fields       Left Right    Full Full         Extraocular Movement       Right Left    Full Full         Neuro/Psych     Oriented x3: Yes   Mood/Affect: Normal         Dilation     Both eyes: 1.0% Mydriacyl, 2.5% Phenylephrine @ 8:34 AM           Slit Lamp and Fundus Exam     Slit Lamp Exam       Right Left   Lids/Lashes Dermatochalasis - upper lid, periorbital edema Dermatochalasis - upper lid   Conjunctiva/Sclera White and quiet White and quiet   Cornea Arcus, trace  Punctate epithelial erosions, mild endo pigment Arcus, 1+ Punctate epithelial erosions, mild tear film debris, mild endo pigment   Anterior Chamber Deep, quiet Deep and quiet   Iris Round and dilated Round and dilated   Lens PC IOL in good postion with anterior capsular phimosis, 1+ Posterior capsular opacification PC IOL in good postion with anterior capsular phimosis, 1+ Posterior capsular opacification  sparing center   Vitreous post vitrectomy Vitreous syneresis         Fundus Exam       Right Left   Disc Sharp rim, mild pallor, peripapillary drusen, PPP Pink and Sharp, +cupping   C/D Ratio 0.5 0.75   Macula flat; ERM/VMT gone, blunted foveal reflex, refractile drusen, trace cystic changes - slightly increased, pigment clumping, no heme or edema Blunted foveal reflex, refractile Drusen, early Atrophy, RPE mottling and clumping, +PEDs/CNV, +edema/cystic changes -- persistent, no heme   Vessels attenuated, mild tortuousity Vascular attenuation, mild Tortuousity   Periphery Attached, scattered peripheral drusen, laser changes superiorly Attached with peripheral drusen           Refraction     Wearing Rx       Sphere Cylinder Axis Add   Right -1.00 +0.75 180 +2.75   Left -0.50 +0.75 004 +2.75            IMAGING AND PROCEDURES  Imaging and Procedures for 02/09/18  OCT, Retina - OU - Both Eyes       Right Eye Quality was good. Central Foveal Thickness: 250. Progression has worsened. Findings include retinal drusen , outer retinal atrophy, intraretinal hyper-reflective material, abnormal foveal contour, pigment epithelial detachment, no SRF, intraretinal fluid (Mild interval increase in cystic changes/IRF IT mac).   Left Eye Quality was good. Central Foveal Thickness: 284. Progression has been stable. Findings include vitreous traction, pigment epithelial detachment, abnormal foveal contour, retinal drusen , epiretinal membrane, outer retinal atrophy, no SRF, intraretinal  fluid (persistent VMT w/ cystic changes, no active exudative disease).   Notes Images taken, stored on drive  Diagnosis / Impression:  OD: Exudative ARMD -- Mild interval increase in cystic changes/IRF IT mac  OS: exudative ARMD-- no active exudative disease; persistent VMT w/ central cystic changes   Clinical management:  See below  Abbreviations: NFP - Normal foveal profile. CME - cystoid macular edema. PED - pigment epithelial detachment. IRF - intraretinal fluid. SRF - subretinal fluid. EZ - ellipsoid zone. ERM - epiretinal membrane. ORA - outer retinal atrophy. ORT - outer retinal tubulation. SRHM - subretinal hyper-reflective material       Intravitreal Injection, Pharmacologic Agent - OD - Right Eye       Time Out 08/13/2021. 8:53 AM. Confirmed correct patient, procedure, site, and patient consented.   Anesthesia Topical anesthesia was used. Anesthetic medications included Lidocaine 2%, Proparacaine 0.5%.   Procedure Preparation included 5% betadine to ocular surface, eyelid speculum. A supplied needle was used.   Injection: 1.25 mg Bevacizumab 1.33m/0.05ml   Route: Intravitreal, Site: Right Eye   NDC:: 63875-643-32 Lot: [id number] , Expiration date: 09/25/2021, Waste: 0 mL   Post-op Post injection exam found visual acuity of at least counting fingers. The patient tolerated the procedure well. There were no complications. The patient received written and verbal post procedure care education. Post injection medications were not given.            ASSESSMENT/PLAN:    ICD-10-CM   1. Exudative age-related macular degeneration of right eye with active choroidal neovascularization (HCC)  H35.3211 Intravitreal Injection, Pharmacologic Agent - OD - Right Eye    Bevacizumab (AVASTIN) SOLN 1.25 mg    2. Exudative age-related macular degeneration of left eye with active choroidal neovascularization ([place]  H35.3221     3. Vitreomacular adhesion of both eyes  H43.823      4. Retinal edema  H35.81 OCT, Retina - OU - Both Eyes  5. Pseudophakia of both eyes  Z96.1      1. 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    - S/P IVA OD #1 (12.03.18), #2 (01.02.19), #3 (01.30.19), #4 (03.01.19), #5 (04.01.19), #6 (04.30.19), #7 (06.05.19), #8 (07.19.19), #9 (08.30.19), #10 (10.14.19), #11 (11.18.19), #12 (12.30.19), #13 (02.10.20), #14 (04.06.20), #15 (09.17.20), #16 (10.15.20), #17 (12.01.20), #18 (01.18.21), #19 (03.15.21), #20 (5.11.21), #21 (07.20.21), #22 (10.14.21), #23 (01.10.22), #24 (03.07.22), #24 (05.23.22), #25 (9.1.22)  - has had good response to IVA -- has been stable on 3 month interval  - FA (04.01.19) shows mild CNVM OD consistent with exudative ARMD  - OCT shows Mild interval increase in cystic changes/IRF IT mac OD at 8.5 wks  - BCVA improved to 20/60 from 20/80 today  - recommend IVA OD #26 today, 11.1.22  - pt wishes to proceed  - RBA of procedure discussed, questions answered  - informed consent obtained and signed  - see procedure note  - Avastin informed consent form signed and scanned on 01.18.2021  - f/u 7-8 wks -- DFE/OCT/possible injection   2. Age related macular degeneration, exudative, OS  - interval conversion from non-exudative to exudative noted 07.20.21  - s/p IVA OS #1 (07.20.21), #2 (08.19.21), #3 (09.16.21), #4 (10.14.21), #5 (11.29.21), #6 (01.10.22), #7 (03.07.22), #8 (05.23.22), #9 (9.1.22)  - exam shows focal heme overlying PEDs temporal macula stably resolved  - OCT with persistent VMT and cystic changes, no active exudative disease at 8.5 wk interval  - BCVA improved to 20/40 from 20/50 today  - recommend holding off on IVA OS today  - informed consent obtained, signed and scanned, 07.20.21  3,4. 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 Pseudophakia OU  - s/p CE/IOL OU  - s/p YAG OU  - beautiful surgeries, doing well  - monitor  Ophthalmic Meds Ordered this visit:  Meds ordered this encounter  Medications   Bevacizumab (AVASTIN) SOLN 1.25 mg     Return for 7-8 wk f/u for exu ARMD OU w/DFE/OCT/likely inj..  There are no Patient Instructions on file for this visit.  This document serves as a record of services personally performed by Gardiner Sleeper, MD, PhD. It was created on their behalf by Estill Bakes, COT an ophthalmic technician. The creation of this record is the provider's dictation and/or activities during the visit.    Electronically signed by: Estill Bakes, COT 10.26.22 @ 9:21 AM   This document serves as a record of services personally performed by Gardiner Sleeper, MD, PhD. It was created on their behalf by San Jetty. Owens Shark, OA an ophthalmic technician. The creation of this record is the provider's dictation and/or activities during the visit.    Electronically signed by: San Jetty. Owens Shark, OA 11.1.22 @ 9:21 AM   Gardiner Sleeper, M.D., Ph.D. Diseases & Surgery of the Retina and Vitreous [facility] 11.1.22  I have reviewed the above documentation for accuracy and completeness, and I agree with the above. Gardiner Sleeper, M.D., Ph.D. 08/13/21 9:21 AM   Abbreviations: M myopia (nearsighted); A astigmatism; H hyperopia (farsighted); P presbyopia; Mrx spectacle prescription;  CTL contact lenses; OD right eye; OS left eye; OU both eyes  XT exotropia; ET esotropia; PEK punctate epithelial keratitis; PEE punctate epithelial erosions; DES dry eye syndrome; MGD meibomian gland dysfunction; ATs artificial tears; PFAT's preservative free artificial tears; [place] nuclear sclerotic cataract; PSC  posterior subcapsular cataract; ERM epi-retinal membrane; PVD posterior vitreous detachment; RD retinal detachment; DM diabetes mellitus; DR diabetic retinopathy; NPDR non-proliferative diabetic retinopathy; PDR proliferative diabetic retinopathy; CSME clinically significant macular edema; DME diabetic macular edema; dbh dot blot hemorrhages; CWS cotton wool spot; POAG primary open angle glaucoma; C/D cup-to-disc ratio; HVF humphrey visual field; GVF goldmann visual field; OCT optical coherence tomography; IOP intraocular pressure; BRVO Branch retinal vein occlusion; CRVO central retinal vein occlusion; CRAO central retinal artery occlusion; BRAO branch retinal artery occlusion; RT retinal tear; SB scleral buckle; PPV pars plana vitrectomy; VH Vitreous hemorrhage; PRP panretinal laser photocoagulation; IVK intravitreal kenalog; VMT vitreomacular traction; MH Macular hole;  NVD neovascularization of the disc; NVE neovascularization elsewhere; AREDS age related eye disease study; ARMD age related macular degeneration; POAG primary open angle glaucoma; EBMD epithelial/anterior basement membrane dystrophy; ACIOL anterior chamber intraocular lens; IOL intraocular lens; PCIOL posterior chamber intraocular lens; Phaco/IOL phacoemulsification with intraocular lens placement; [place] photorefractive keratectomy; LASIK laser assisted in situ keratomileusis; HTN hypertension; DM diabetes mellitus; COPD chronic obstructive pulmonary disease

## 2021-08-13 ENCOUNTER — Other Ambulatory Visit: Payer: Self-pay

## 2021-08-13 ENCOUNTER — Ambulatory Visit (INDEPENDENT_AMBULATORY_CARE_PROVIDER_SITE_OTHER): Payer: Medicare HMO | Admitting: Ophthalmology

## 2021-08-13 ENCOUNTER — Encounter (INDEPENDENT_AMBULATORY_CARE_PROVIDER_SITE_OTHER): Payer: Self-pay | Admitting: Ophthalmology

## 2021-08-13 DIAGNOSIS — H43823 Vitreomacular adhesion, bilateral: Secondary | ICD-10-CM

## 2021-08-13 DIAGNOSIS — H353231 Exudative age-related macular degeneration, bilateral, with active choroidal neovascularization: Secondary | ICD-10-CM | POA: Diagnosis not present

## 2021-08-13 DIAGNOSIS — Z961 Presence of intraocular lens: Secondary | ICD-10-CM | POA: Diagnosis not present

## 2021-08-13 DIAGNOSIS — H353221 Exudative age-related macular degeneration, left eye, with active choroidal neovascularization: Secondary | ICD-10-CM

## 2021-08-13 DIAGNOSIS — H3581 Retinal edema: Secondary | ICD-10-CM

## 2021-08-13 DIAGNOSIS — H353211 Exudative age-related macular degeneration, right eye, with active choroidal neovascularization: Secondary | ICD-10-CM

## 2021-08-13 MED ORDER — BEVACIZUMAB CHEMO INJECTION 1.25MG/0.05ML SYRINGE FOR KALEIDOSCOPE
1.2500 mg | INTRAVITREAL | Status: AC | PRN
  Administered 2021-08-13: 1.25 mg via INTRAVITREAL

## 2021-08-27 ENCOUNTER — Other Ambulatory Visit: Payer: Self-pay | Admitting: Internal Medicine

## 2021-08-27 ENCOUNTER — Ambulatory Visit: Payer: Medicare HMO | Admitting: Orthopaedic Surgery

## 2021-08-27 DIAGNOSIS — Z1231 Encounter for screening mammogram for malignant neoplasm of breast: Secondary | ICD-10-CM

## 2021-10-02 ENCOUNTER — Ambulatory Visit
Admission: RE | Admit: 2021-10-02 | Discharge: 2021-10-02 | Disposition: A | Discharge status: Home / Self Care | Payer: Medicare HMO | Source: Ambulatory Visit | Attending: Internal Medicine | Admitting: Internal Medicine

## 2021-10-02 DIAGNOSIS — Z1231 Encounter for screening mammogram for malignant neoplasm of breast: Secondary | ICD-10-CM

## 2021-10-03 NOTE — Progress Notes (Signed)
[facility] Note  10/09/2021     CHIEF COMPLAINT Patient presents for Retina Follow Up    HISTORY OF PRESENT ILLNESS: Lindsay Harvey is a 85 y.o. female who presents to the clinic today for:   HPI     Retina Follow Up   Patient presents with  Wet AMD.  In right eye.  Severity is moderate.  Duration of 7 weeks.  Since onset it is stable.  I, the attending physician,  performed the HPI with the patient and updated documentation appropriately.        Comments   Pt here for 7 wk ret f/u for exu ARMD OD. Pt states vision is unchanged since previous visit, no ocular complaints. She does reports some pain in OU that occurs a few times per week. Does not seem to affect her vision. She does report using Refresh gtts 3-4 times per day OU. She did not bring corrections with her today.       Last edited by [name], MD on 10/09/2021 12:22 PM.    Pt states VA is okay, left eye hurts   Referring physician: [name], MD [id number] [place],  [place] [address]  HISTORICAL INFORMATION:   Selected notes from the MEDICAL RECORD NUMBER Referred by Dr. [name] for concern of AMD OU, CNV   CURRENT MEDICATIONS: Current Outpatient Medications (Ophthalmic Drugs)  Medication Sig   carboxymethylcellulose (REFRESH PLUS) 0.5 % SOLN Place 1 drop into both eyes 3 (three) times daily as needed (dry/irritated eyes).    REFRESH OPTIVE MEGA-3 0.5-1-0.5 % SOLN Place 1 drop into both eyes 3 (three) times daily as needed (discomfort/eye irritation.).   prednisoLONE acetate (PRED FORTE) 1 % ophthalmic suspension Place 1 drop into the right eye 4 (four) times daily. (Patient not taking: Reported on 10/09/2021)   No current facility-administered medications for this visit. (Ophthalmic Drugs)   Current Outpatient Medications (Other)  Medication Sig   calcium carbonate (OSCAL) 1500 (600 Ca) MG TABS tablet Take 600 mg of elemental calcium by mouth 2 (two) times a day.    Cholecalciferol (VITAMIN D3) 50 MCG (2000 UT) TABS Take 2,000 Units by mouth daily.   famotidine (PEPCID) 20 MG tablet Take 20 mg by mouth daily.   methylPREDNISolone (MEDROL DOSEPAK) 4 MG TBPK tablet Use as directed   Multiple Vitamins-Minerals (EMERGEN-C IMMUNE PO) Take 1 packet by mouth daily as needed (for immune support).   Multiple Vitamins-Minerals (PRESERVISION AREDS 2 PO) Take 1 tablet by mouth 2 (two) times a day.    omeprazole (PRILOSEC) 40 MG capsule 1 cap(s)   simvastatin (ZOCOR) 40 MG tablet Take 10 mg by mouth every evening. 1/4 tablet at night   Current Facility-Administered Medications (Other)  Medication Route   Bevacizumab (AVASTIN) SOLN 1.25 mg Intravitreal   Bevacizumab (AVASTIN) SOLN 1.25 mg Intravitreal   Bevacizumab (AVASTIN) SOLN 1.25 mg Intravitreal   Bevacizumab (AVASTIN) SOLN 1.25 mg Intravitreal   Bevacizumab (AVASTIN) SOLN 1.25 mg Intravitreal   Bevacizumab (AVASTIN) SOLN 1.25 mg Intravitreal   Bevacizumab (AVASTIN) SOLN 1.25 mg Intravitreal   Bevacizumab (AVASTIN) SOLN 1.25 mg Intravitreal   Bevacizumab (AVASTIN) SOLN 1.25 mg Intravitreal   Bevacizumab (AVASTIN) SOLN 1.25 mg Intravitreal   Bevacizumab (AVASTIN) SOLN 1.25 mg Intravitreal   Bevacizumab (AVASTIN) SOLN 1.25 mg Intravitreal   Bevacizumab (AVASTIN) SOLN 1.25 mg Intravitreal   REVIEW OF SYSTEMS: ROS   Positive for: Gastrointestinal, Eyes Negative for: Constitutional, Neurological, Skin, Genitourinary, Musculoskeletal,  HENT, Endocrine, Cardiovascular, Respiratory, Psychiatric, Allergic/Imm, Heme/Lymph Last edited by Kingsley Spittle, COT on 10/09/2021  8:33 AM.     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   Headache    Hypercholesterolemia    Macular degeneration    Wet OU   Wears glasses    Past Surgical History:  Procedure Laterality Date   25  GAUGE PARS PLANA VITRECTOMY WITH 20 GAUGE MVR PORT FOR MACULAR HOLE Right 04/07/2019   Procedure: 25 GAUGE PARS PLANA VITRECTOMY WITH 20 GAUGE MVR PORT FOR MACULAR HOLE;  Surgeon: [name], MD;  Location: [place];  Service: Ophthalmology;  Laterality: Right;   CATARACT EXTRACTION Bilateral    2013   DILATION AND CURETTAGE OF UTERUS     EYE SURGERY Bilateral 2013   Cat Sx   GAS/FLUID EXCHANGE Right 04/07/2019   Procedure: Gas/Fluid Exchange;  Surgeon: [name], MD;  Location: [place];  Service: Ophthalmology;  Laterality: Right;   MEMBRANE PEEL Right 04/07/2019   Procedure: [name];  Surgeon: [name], MD;  Location: [place];  Service: Ophthalmology;  Laterality: Right;   PHOTOCOAGULATION WITH LASER Right 04/07/2019   Procedure: Photocoagulation With Laser;  Surgeon: [name], MD;  Location: [place];  Service: Ophthalmology;  Laterality: Right;   YAG LASER APPLICATION Bilateral    FAMILY HISTORY Family History  Problem Relation Age of Onset   Stroke Father    SOCIAL HISTORY Social History   Tobacco Use   Smoking status: Never   Smokeless tobacco: Never  Vaping Use   Vaping Use: Never used  Substance Use Topics   Alcohol use: No   Drug use: No       OPHTHALMIC EXAM: Base Eye Exam     Visual Acuity (Snellen - Linear)       Right Left   Dist [place] 20/150 +1 20/70 -2   Dist ph [place] NI NI         Tonometry (Tonopen, 8:45 AM)       Right Left   Pressure 12 12         Pupils       Dark Light Shape React APD   Right 3 2 Round Brisk None   Left 3 2 Round Brisk None         Visual Fields (Counting fingers)       Left Right    Full Full         Extraocular Movement       Right Left    Full, Ortho Full, Ortho         Neuro/Psych     Oriented x3: Yes   Mood/Affect: Normal         Dilation     Both eyes: 1.0% Mydriacyl, 2.5% Phenylephrine @ 8:46 AM           Slit Lamp and Fundus Exam     Slit Lamp Exam       Right Left    Lids/Lashes Dermatochalasis - upper lid, periorbital edema Dermatochalasis - upper lid   Conjunctiva/Sclera White and quiet White and quiet   Cornea Arcus, 1+ fine Punctate epithelial erosions, mild endo pigment Arcus, 3+ Punctate epithelial erosions, mild tear film debris, mild endo pigment, dry tear fil, decreased TBUT   Anterior Chamber Deep, quiet Deep and quiet  Iris Round and dilated Round and dilated   Lens PC IOL in good postion with anterior capsular phimosis, 1+ Posterior capsular opacification PC IOL in good postion with anterior capsular phimosis, 1+ Posterior capsular opacification sparing center   Anterior Vitreous post vitrectomy Vitreous syneresis         Fundus Exam       Right Left   Disc Sharp rim, mild pallor, peripapillary drusen, PPP Pink and Sharp, +cupping, +PPP, peripapillary drusen   C/D Ratio 0.5 0.75   Macula flat; ERM/VMT gone, blunted foveal reflex, refractile drusen, trace cystic changes - slightly increased, pigment clumping, no heme or edema Blunted foveal reflex, refractile Drusen, early Atrophy, RPE mottling and clumping, +PEDs/CNV, +edema/cystic changes -- persistent, no heme   Vessels attenuated, mild Copper wiring Vascular attenuation, mild Tortuousity   Periphery Attached, scattered peripheral drusen, laser changes superiorly Attached with peripheral drusen           Refraction     Wearing Rx       Sphere Cylinder Axis Add   Right -1.00 +0.75 180 +2.75   Left -0.50 +0.75 004 +2.75         Manifest Refraction       Sphere Cylinder Axis Dist VA   Right -1.00 +0.50 180 20/60   Left -0.50 +1.00 005 20/40-1            IMAGING AND PROCEDURES  Imaging and Procedures for 02/09/18  OCT, Retina - OU - Both Eyes       Right Eye Quality was good. Central Foveal Thickness: 299. Progression has worsened. Findings include retinal drusen , outer retinal atrophy, intraretinal hyper-reflective material, abnormal foveal contour, pigment  epithelial detachment, no SRF, intraretinal fluid (Mild interval increase in IRF overlying low PED inferior macula).   Left Eye Quality was good. Central Foveal Thickness: 300. Progression has been stable. Findings include vitreous traction, pigment epithelial detachment, abnormal foveal contour, retinal drusen , epiretinal membrane, outer retinal atrophy, no SRF, intraretinal fluid (persistent VMT w/ cystic changes, no active exudative disease).   Notes Images taken, stored on drive  Diagnosis / Impression:  OD: Exudative ARMD -- Mild interval increase in IRF overlying low PED inferior macula OS: exudative ARMD-- no active exudative disease; persistent VMT w/ central cystic changes   Clinical management:  See below  Abbreviations: NFP - Normal foveal profile. CME - cystoid macular edema. PED - pigment epithelial detachment. IRF - intraretinal fluid. SRF - subretinal fluid. EZ - ellipsoid zone. ERM - epiretinal membrane. ORA - outer retinal atrophy. ORT - outer retinal tubulation. SRHM - subretinal hyper-reflective material       Intravitreal Injection, Pharmacologic Agent - OD - Right Eye       Time Out 10/09/2021. 9:31 AM. Confirmed correct patient, procedure, site, and patient consented.   Anesthesia Topical anesthesia was used. Anesthetic medications included Lidocaine 2%, Proparacaine 0.5%.   Procedure Preparation included 5% betadine to ocular surface, eyelid speculum. A supplied needle was used.   Injection: 1.25 mg Bevacizumab 1.23m/0.05ml   Route: Intravitreal, Site: Right Eye   NDC:: 13086-578-46 Lot: [id number] , Expiration date: 11/19/2021, Waste: 0 mL   Post-op Post injection exam found visual acuity of at least counting fingers. The patient tolerated the procedure well. There were no complications. The patient received written and verbal post procedure care education. Post injection medications were not given.            ASSESSMENT/PLAN:    ICD-10-CM   1.  Exudative  age-related macular degeneration of right eye with active choroidal neovascularization (HCC)  H35.3211 OCT, Retina - OU - Both Eyes    Intravitreal Injection, Pharmacologic Agent - OD - Right Eye    Bevacizumab (AVASTIN) SOLN 1.25 mg    2. Exudative age-related macular degeneration of left eye with active choroidal neovascularization ([place])  H35.3221     3. 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 Pseudophakia of both eyes  Z96.1       1. 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    - S/P IVA OD #1 (12.03.18), #2 (01.02.19), #3 (01.30.19), #4 (03.01.19), #5 (04.01.19), #6 (04.30.19), #7 (06.05.19), #8 (07.19.19), #9 (08.30.19), #10 (10.14.19), #11 (11.18.19), #12 (12.30.19), #13 (02.10.20), #14 (04.06.20), #15 (09.17.20), #16 (10.15.20), #17 (12.01.20), #18 (01.18.21), #19 (03.15.21), #20 (5.11.21), #21 (07.20.21), #22 (10.14.21), #23 (01.10.22), #24 (03.07.22), #24 (05.23.22), #25 (9.1.22), #26 (11.1.22)  - has had good response to IVA -- has been stable on 3 month interval  - FA (04.01.19) shows mild CNVM OD consistent with exudative ARMD  - OCT shows Mild interval increase in cystic changes/IRF IT mac OD at 8 wks -- ?developing IVA resistance  - BCVA 20/150 today, but pt did not bring glasses  - recommend IVA OD #27 today, 12.28.22  - pt wishes to proceed  - RBA of procedure discussed, questions answered  - informed consent obtained and signed  - see procedure note  - Avastin informed consent form signed and scanned on 01.18.2021  - f/u 6 wks -- DFE/OCT/possible injection   2. Age related macular degeneration, exudative, OS  - interval conversion from non-exudative to exudative noted 07.20.21  - s/p IVA OS #1 (07.20.21), #2 (08.19.21), #3 (09.16.21), #4 (10.14.21), #5 (11.29.21), #6 (01.10.22), #7 (03.07.22), #8 (05.23.22), #9 (9.1.22)  - exam shows no heme  - OCT with persistent VMT and cystic changes, no active  exudative disease (now 3+ mos since last IVA OS)  - BCVA 20/70 today, but pt did not bring glasses  - recommend holding off on IVA OS today  - informed consent obtained, signed and scanned, 07.20.21  3. 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 Pseudophakia OU  - s/p CE/IOL OU  - s/p YAG OU  - beautiful surgeries, doing well  - monitor  Ophthalmic Meds Ordered this visit:  Meds ordered this encounter  Medications   Bevacizumab (AVASTIN) SOLN 1.25 mg     Return in about 6 weeks (around 11/20/2021) for f/u exu ARMD OD, DFE, OCT.  There are no Patient Instructions on file for this visit.  This document serves as a record of services personally performed by Gardiner Sleeper, MD, PhD. It was created on their behalf by Orvan Falconer, an ophthalmic technician. The creation of this record is the provider's dictation and/or activities during the visit.    Electronically signed by: Orvan Falconer, OA, 10/09/21  12:25 PM  This document serves as a record of services personally performed by Gardiner Sleeper, MD, PhD. It was created on their behalf by San Jetty. Owens Shark, OA an ophthalmic technician. The creation of this record is the provider's dictation and/or activities during the visit.    Electronically signed by: San Jetty. Marguerita Merles 12.28.2022 12:25 PM  Gardiner Sleeper, M.D., Ph.D. Diseases & Surgery of the Retina and Vitreous [facility]  I have reviewed the above documentation for accuracy and completeness, and I agree with the above. Gardiner Sleeper, M.D., Ph.D. 10/09/21 12:25 PM   Abbreviations: M myopia (nearsighted); A  astigmatism; H hyperopia (farsighted); P presbyopia; Mrx spectacle prescription;  CTL contact lenses; OD right eye; OS left eye; OU both eyes  XT exotropia; ET esotropia; PEK punctate epithelial keratitis; PEE punctate epithelial erosions; DES dry eye syndrome; MGD meibomian gland dysfunction; ATs artificial tears; PFAT's preservative free artificial tears; [place] nuclear sclerotic cataract; PSC posterior subcapsular cataract; ERM epi-retinal membrane; PVD posterior vitreous detachment; RD retinal detachment; DM diabetes mellitus; DR diabetic retinopathy; NPDR non-proliferative diabetic retinopathy; PDR proliferative diabetic retinopathy; CSME clinically significant macular edema; DME diabetic macular edema; dbh dot blot hemorrhages; CWS cotton wool spot; POAG primary open angle glaucoma; C/D cup-to-disc ratio; HVF humphrey visual field; GVF goldmann visual field; OCT optical coherence tomography; IOP intraocular pressure; BRVO Branch retinal vein occlusion; CRVO central retinal vein occlusion; CRAO central retinal artery occlusion; BRAO branch retinal artery occlusion; RT retinal tear; SB scleral buckle; PPV pars plana vitrectomy; VH Vitreous hemorrhage; PRP panretinal laser photocoagulation; IVK intravitreal kenalog; VMT vitreomacular traction; MH Macular hole;  NVD neovascularization of the disc; NVE neovascularization elsewhere; AREDS age related eye disease study; ARMD age related macular degeneration; POAG primary open angle glaucoma; EBMD epithelial/anterior basement membrane dystrophy; ACIOL anterior chamber intraocular lens; IOL intraocular lens; PCIOL posterior chamber intraocular lens; Phaco/IOL phacoemulsification with intraocular lens placement; [place] photorefractive keratectomy; LASIK laser assisted in situ keratomileusis; HTN hypertension; DM diabetes mellitus; COPD chronic obstructive pulmonary disease

## 2021-10-09 ENCOUNTER — Encounter (INDEPENDENT_AMBULATORY_CARE_PROVIDER_SITE_OTHER): Payer: Self-pay | Admitting: Ophthalmology

## 2021-10-09 ENCOUNTER — Ambulatory Visit (INDEPENDENT_AMBULATORY_CARE_PROVIDER_SITE_OTHER): Payer: Medicare HMO | Admitting: Ophthalmology

## 2021-10-09 ENCOUNTER — Other Ambulatory Visit: Payer: Self-pay

## 2021-10-09 DIAGNOSIS — H353211 Exudative age-related macular degeneration, right eye, with active choroidal neovascularization: Secondary | ICD-10-CM

## 2021-10-09 DIAGNOSIS — H353221 Exudative age-related macular degeneration, left eye, with active choroidal neovascularization: Secondary | ICD-10-CM

## 2021-10-09 DIAGNOSIS — H43823 Vitreomacular adhesion, bilateral: Secondary | ICD-10-CM | POA: Diagnosis not present

## 2021-10-09 DIAGNOSIS — H353231 Exudative age-related macular degeneration, bilateral, with active choroidal neovascularization: Secondary | ICD-10-CM

## 2021-10-09 DIAGNOSIS — Z961 Presence of intraocular lens: Secondary | ICD-10-CM

## 2021-10-09 MED ORDER — BEVACIZUMAB CHEMO INJECTION 1.25MG/0.05ML SYRINGE FOR KALEIDOSCOPE
1.2500 mg | INTRAVITREAL | Status: AC | PRN
Start: 2021-10-09 — End: 2021-10-09
  Administered 2021-10-09: 12:00:00 1.25 mg via INTRAVITREAL

## 2021-11-18 NOTE — Progress Notes (Shared)
[facility] Note  11/20/2021     CHIEF COMPLAINT Patient presents for No chief complaint on file.    HISTORY OF PRESENT ILLNESS: Lindsay Harvey is a 86 y.o. female who presents to the clinic today for:    Pt states VA is okay, left eye hurts   Referring physician: [name], MD [address],  [place] [address]  HISTORICAL INFORMATION:   Selected notes from the MEDICAL RECORD NUMBER Referred by Dr. [name] for concern of AMD OU, CNV   CURRENT MEDICATIONS: Current Outpatient Medications (Ophthalmic Drugs)  Medication Sig   carboxymethylcellulose (REFRESH PLUS) 0.5 % SOLN Place 1 drop into both eyes 3 (three) times daily as needed (dry/irritated eyes).    prednisoLONE acetate (PRED FORTE) 1 % ophthalmic suspension Place 1 drop into the right eye 4 (four) times daily. (Patient not taking: Reported on 10/09/2021)   REFRESH OPTIVE MEGA-3 0.5-1-0.5 % SOLN Place 1 drop into both eyes 3 (three) times daily as needed (discomfort/eye irritation.).   No current facility-administered medications for this visit. (Ophthalmic Drugs)   Current Outpatient Medications (Other)  Medication Sig   calcium carbonate (OSCAL) 1500 (600 Ca) MG TABS tablet Take 600 mg of elemental calcium by mouth 2 (two) times a day.   Cholecalciferol (VITAMIN D3) 50 MCG (2000 UT) TABS Take 2,000 Units by mouth daily.   famotidine (PEPCID) 20 MG tablet Take 20 mg by mouth daily.   methylPREDNISolone (MEDROL DOSEPAK) 4 MG TBPK tablet Use as directed   Multiple Vitamins-Minerals (EMERGEN-C IMMUNE PO) Take 1 packet by mouth daily as needed (for immune support).   Multiple Vitamins-Minerals (PRESERVISION AREDS 2 PO) Take 1 tablet by mouth 2 (two) times a day.    omeprazole (PRILOSEC) 40 MG capsule 1 cap(s)   simvastatin (ZOCOR) 40 MG tablet Take 10 mg by mouth every evening. 1/4 tablet at night   Current Facility-Administered Medications (Other)  Medication Route   Bevacizumab  (AVASTIN) SOLN 1.25 mg Intravitreal   Bevacizumab (AVASTIN) SOLN 1.25 mg Intravitreal   Bevacizumab (AVASTIN) SOLN 1.25 mg Intravitreal   Bevacizumab (AVASTIN) SOLN 1.25 mg Intravitreal   Bevacizumab (AVASTIN) SOLN 1.25 mg Intravitreal   Bevacizumab (AVASTIN) SOLN 1.25 mg Intravitreal   Bevacizumab (AVASTIN) SOLN 1.25 mg Intravitreal   Bevacizumab (AVASTIN) SOLN 1.25 mg Intravitreal   Bevacizumab (AVASTIN) SOLN 1.25 mg Intravitreal   Bevacizumab (AVASTIN) SOLN 1.25 mg Intravitreal   Bevacizumab (AVASTIN) SOLN 1.25 mg Intravitreal   Bevacizumab (AVASTIN) SOLN 1.25 mg Intravitreal   Bevacizumab (AVASTIN) SOLN 1.25 mg Intravitreal   REVIEW OF SYSTEMS:   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   Headache    Hypercholesterolemia    Macular degeneration    Wet OU   Wears glasses    Past Surgical History:  Procedure Laterality Date   25 GAUGE PARS PLANA VITRECTOMY WITH 20 GAUGE MVR PORT FOR MACULAR HOLE Right 04/07/2019   Procedure: 25 GAUGE PARS PLANA VITRECTOMY WITH 20 GAUGE MVR PORT FOR MACULAR HOLE;  Surgeon: [name], MD;  Location: [facility];  Service: Ophthalmology;  Laterality: Right;   CATARACT EXTRACTION Bilateral    2013   DILATION AND CURETTAGE OF UTERUS     EYE SURGERY Bilateral 2013   Cat Sx   GAS/FLUID EXCHANGE Right 04/07/2019   Procedure: Gas/Fluid Exchange;  Surgeon: [name], MD;  Location: [facility];  Service: Ophthalmology;  Laterality: Right;   MEMBRANE PEEL Right 04/07/2019   Procedure: [name];  Surgeon: [name], MD;  Location: [facility];  Service: Ophthalmology;  Laterality: Right;   PHOTOCOAGULATION WITH LASER Right 04/07/2019   Procedure: Photocoagulation With Laser;  Surgeon: [name], MD;  Location: [facility];  Service: Ophthalmology;  Laterality: Right;   YAG LASER APPLICATION Bilateral    FAMILY  HISTORY Family History  Problem Relation Age of Onset   Stroke Father    SOCIAL HISTORY Social History   Tobacco Use   Smoking status: Never   Smokeless tobacco: Never  Vaping Use   Vaping Use: Never used  Substance Use Topics   Alcohol use: No   Drug use: No       OPHTHALMIC EXAM: Not recorded     IMAGING AND PROCEDURES  Imaging and Procedures for 02/09/18          ASSESSMENT/PLAN:  No diagnosis found.   1. 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    - S/P IVA OD #1 (12.03.18), #2 (01.02.19), #3 (01.30.19), #4 (03.01.19), #5 (04.01.19), #6 (04.30.19), #7 (06.05.19), #8 (07.19.19), #9 (08.30.19), #10 (10.14.19), #11 (11.18.19), #12 (12.30.19), #13 (02.10.20), #14 (04.06.20), #15 (09.17.20), #16 (10.15.20), #17 (12.01.20), #18 (01.18.21), #19 (03.15.21), #20 (5.11.21), #21 (07.20.21), #22 (10.14.21), #23 (01.10.22), #24 (03.07.22), #24 (05.23.22), #25 (9.1.22), #26 (11.1.22), #27 (12.28.22)  - has had good response to IVA -- has been stable on 3 month interval  - FA (04.01.19) shows mild CNVM OD consistent with exudative ARMD  - OCT shows Mild interval increase in cystic changes/IRF IT mac OD at 8 wks -- ?developing IVA resistance  - BCVA 20/150 today, but pt did not bring glasses  - recommend IVA OD #28 today, 02.08.23  - pt wishes to proceed  - RBA of procedure discussed, questions answered  - informed consent obtained and signed  - see procedure note  - Avastin informed consent form signed and scanned on 01.18.2021  - f/u 6 wks -- DFE/OCT/possible injection   2. 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 Pseudophakia OU  - s/p CE/IOL OU  - s/p YAG OU  - beautiful surgeries, doing well  - monitor  Ophthalmic Meds Ordered this visit:  No orders of the defined types were  placed in this encounter.    No follow-ups on file.  There are no Patient Instructions on file for this visit.  This document serves as a record of services personally performed by Gardiner Sleeper, MD, PhD. It was created on their behalf by Orvan Falconer, an ophthalmic technician. The creation of this record is the provider's dictation and/or activities during the visit.    Electronically signed by: Orvan Falconer, OA, 11/18/21  10:53 AM    Gardiner Sleeper, M.D., Ph.D. Diseases & Surgery of the Retina and Vitreous [facility]  I have reviewed the above documentation for accuracy and completeness, and I agree with the above. Gardiner Sleeper, M.D., Ph.D. 10/09/21 10:53 AM   Abbreviations: M myopia (nearsighted); A astigmatism; H hyperopia (farsighted); P presbyopia; Mrx spectacle prescription;  CTL contact lenses; OD right eye; OS left eye; OU both eyes  XT exotropia; ET esotropia; PEK punctate epithelial keratitis; PEE punctate epithelial erosions; DES dry eye syndrome; MGD meibomian gland dysfunction; ATs artificial tears; PFAT's preservative free artificial tears; [place] nuclear sclerotic cataract; PSC posterior subcapsular cataract;  ERM epi-retinal membrane; PVD posterior vitreous detachment; RD retinal detachment; DM diabetes mellitus; DR diabetic retinopathy; NPDR non-proliferative diabetic retinopathy; PDR proliferative diabetic retinopathy; CSME clinically significant macular edema; DME diabetic macular edema; dbh dot blot hemorrhages; CWS cotton wool spot; POAG primary open angle glaucoma; C/D cup-to-disc ratio; HVF humphrey visual field; GVF goldmann visual field; OCT optical coherence tomography; IOP intraocular pressure; BRVO Branch retinal vein occlusion; CRVO central retinal vein occlusion; CRAO central retinal artery occlusion; BRAO branch retinal artery occlusion; RT retinal tear; SB scleral buckle; PPV pars plana vitrectomy; VH Vitreous hemorrhage; PRP panretinal laser photocoagulation; IVK intravitreal kenalog; VMT vitreomacular traction; MH Macular hole;  NVD neovascularization of the disc; NVE neovascularization elsewhere; AREDS age related eye disease study; ARMD age related macular degeneration; POAG primary open angle glaucoma; EBMD epithelial/anterior basement membrane dystrophy; ACIOL anterior chamber intraocular lens; IOL intraocular lens; PCIOL posterior chamber intraocular lens; Phaco/IOL phacoemulsification with intraocular lens placement; [place] photorefractive keratectomy; LASIK laser assisted in situ keratomileusis; HTN hypertension; DM diabetes mellitus; COPD chronic obstructive pulmonary disease

## 2021-11-20 ENCOUNTER — Encounter (INDEPENDENT_AMBULATORY_CARE_PROVIDER_SITE_OTHER): Payer: Medicare Other | Admitting: Ophthalmology

## 2021-11-20 DIAGNOSIS — H43823 Vitreomacular adhesion, bilateral: Secondary | ICD-10-CM

## 2021-11-20 DIAGNOSIS — H353221 Exudative age-related macular degeneration, left eye, with active choroidal neovascularization: Secondary | ICD-10-CM

## 2021-11-20 DIAGNOSIS — Z961 Presence of intraocular lens: Secondary | ICD-10-CM

## 2021-11-20 DIAGNOSIS — H353211 Exudative age-related macular degeneration, right eye, with active choroidal neovascularization: Secondary | ICD-10-CM

## 2021-11-21 NOTE — Progress Notes (Signed)
[facility] Note  11/26/2021     CHIEF COMPLAINT Patient presents for Retina Follow Up    HISTORY OF PRESENT ILLNESS: Lindsay Harvey is a 86 y.o. female who presents to the clinic today for:   HPI     Retina Follow Up   Patient presents with  Wet AMD.  In right eye.  Severity is moderate.  Duration of 7 weeks.  Since onset it is stable.  I, the attending physician,  performed the HPI with the patient and updated documentation appropriately.        Comments   Pt here for 7 wk ret f/u (delayed) for exu ARMD OU. Pt reports vision is the same, just some mild dryness/grittiness OD. She does report using Refresh 3-4 times daily OU, GenTeal Tears QHS OU, which helps.       Last edited by [name], MD on 11/26/2021 11:08 PM.     Pt is delayed to follow up from 6 weeks to 7 weeks, she states her eye is still gritty and irritating   Referring physician: [name], MD [address],  [place] [address]  HISTORICAL INFORMATION:   Selected notes from the MEDICAL RECORD NUMBER Referred by Dr. [name] for concern of AMD OU, CNV   CURRENT MEDICATIONS: Current Outpatient Medications (Ophthalmic Drugs)  Medication Sig   carboxymethylcellulose (REFRESH PLUS) 0.5 % SOLN Place 1 drop into both eyes 3 (three) times daily as needed (dry/irritated eyes).    REFRESH OPTIVE MEGA-3 0.5-1-0.5 % SOLN Place 1 drop into both eyes 3 (three) times daily as needed (discomfort/eye irritation.).   prednisoLONE acetate (PRED FORTE) 1 % ophthalmic suspension Place 1 drop into the right eye 4 (four) times daily. (Patient not taking: Reported on 11/26/2021)   No current facility-administered medications for this visit. (Ophthalmic Drugs)   Current Outpatient Medications (Other)  Medication Sig   calcium carbonate (OSCAL) 1500 (600 Ca) MG TABS tablet Take 600 mg of elemental calcium by mouth 2 (two) times a day.   Cholecalciferol (VITAMIN D3) 50 MCG (2000 UT) TABS  Take 2,000 Units by mouth daily.   famotidine (PEPCID) 20 MG tablet Take 20 mg by mouth daily.   Multiple Vitamins-Minerals (EMERGEN-C IMMUNE PO) Take 1 packet by mouth daily as needed (for immune support).   Multiple Vitamins-Minerals (PRESERVISION AREDS 2 PO) Take 1 tablet by mouth 2 (two) times a day.    omeprazole (PRILOSEC) 40 MG capsule 1 cap(s)   simvastatin (ZOCOR) 40 MG tablet Take 10 mg by mouth every evening. 1/4 tablet at night   methylPREDNISolone (MEDROL DOSEPAK) 4 MG TBPK tablet Use as directed (Patient not taking: Reported on 11/26/2021)   Current Facility-Administered Medications (Other)  Medication Route   Bevacizumab (AVASTIN) SOLN 1.25 mg Intravitreal   Bevacizumab (AVASTIN) SOLN 1.25 mg Intravitreal   Bevacizumab (AVASTIN) SOLN 1.25 mg Intravitreal   Bevacizumab (AVASTIN) SOLN 1.25 mg Intravitreal   Bevacizumab (AVASTIN) SOLN 1.25 mg Intravitreal   Bevacizumab (AVASTIN) SOLN 1.25 mg Intravitreal   Bevacizumab (AVASTIN) SOLN 1.25 mg Intravitreal   Bevacizumab (AVASTIN) SOLN 1.25 mg Intravitreal   Bevacizumab (AVASTIN) SOLN 1.25 mg Intravitreal   Bevacizumab (AVASTIN) SOLN 1.25 mg Intravitreal   Bevacizumab (AVASTIN) SOLN 1.25 mg Intravitreal   Bevacizumab (AVASTIN) SOLN 1.25 mg Intravitreal   Bevacizumab (AVASTIN) SOLN 1.25 mg Intravitreal   REVIEW OF SYSTEMS: ROS   Positive for: Gastrointestinal, Eyes Negative for: Constitutional, Neurological, Skin, Genitourinary, Musculoskeletal, HENT, Endocrine, Cardiovascular,  Respiratory, Psychiatric, Allergic/Imm, Heme/Lymph Last edited by Kingsley Spittle, COT on 11/26/2021  8:45 AM.      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   Headache    Hypercholesterolemia    Macular degeneration    Wet OU   Wears glasses    Past Surgical History:  Procedure Laterality Date   25 GAUGE PARS  PLANA VITRECTOMY WITH 20 GAUGE MVR PORT FOR MACULAR HOLE Right 04/07/2019   Procedure: 25 GAUGE PARS PLANA VITRECTOMY WITH 20 GAUGE MVR PORT FOR MACULAR HOLE;  Surgeon: [name], MD;  Location: [facility];  Service: Ophthalmology;  Laterality: Right;   CATARACT EXTRACTION Bilateral    2013   DILATION AND CURETTAGE OF UTERUS     EYE SURGERY Bilateral 2013   Cat Sx   GAS/FLUID EXCHANGE Right 04/07/2019   Procedure: Gas/Fluid Exchange;  Surgeon: [name], MD;  Location: [facility];  Service: Ophthalmology;  Laterality: Right;   MEMBRANE PEEL Right 04/07/2019   Procedure: [name];  Surgeon: [name], MD;  Location: [facility];  Service: Ophthalmology;  Laterality: Right;   PHOTOCOAGULATION WITH LASER Right 04/07/2019   Procedure: Photocoagulation With Laser;  Surgeon: [name], MD;  Location: [facility];  Service: Ophthalmology;  Laterality: Right;   YAG LASER APPLICATION Bilateral    FAMILY HISTORY Family History  Problem Relation Age of Onset   Stroke Father    SOCIAL HISTORY Social History   Tobacco Use   Smoking status: Never   Smokeless tobacco: Never  Vaping Use   Vaping Use: Never used  Substance Use Topics   Alcohol use: No   Drug use: No       OPHTHALMIC EXAM: Base Eye Exam     Visual Acuity (Snellen - Linear)       Right Left   Dist cc 20/70 -1 20/50 -2   Dist ph cc NI NI    Correction: Glasses         Tonometry (Tonopen, 8:54 AM)       Right Left   Pressure 13 17         Pupils       Dark Light Shape React APD   Right 3 2 Round Brisk None   Left 3 2 Round Brisk None         Visual Fields (Counting fingers)       Left Right    Full Full         Extraocular Movement       Right Left    Full, Ortho Full, Ortho         Neuro/Psych     Oriented x3: Yes   Mood/Affect: Normal         Dilation     Both eyes: 1.0% Mydriacyl, 2.5% Phenylephrine @ 8:55 AM           Slit Lamp and Fundus Exam     Slit Lamp Exam        Right Left   Lids/Lashes Dermatochalasis - upper lid, periorbital edema Dermatochalasis - upper lid   Conjunctiva/Sclera White and quiet White and quiet   Cornea Arcus, 1+ fine Punctate epithelial erosions, mild endo pigment Arcus, 3+ Punctate epithelial erosions, mild tear film debris, mild endo pigment, dry tear fil, decreased TBUT   Anterior Chamber Deep, quiet Deep and  quiet   Iris Round and dilated Round and dilated   Lens PC IOL in good postion with anterior capsular phimosis, 1+ Posterior capsular opacification PC IOL in good postion with anterior capsular phimosis, 1+ Posterior capsular opacification sparing center   Anterior Vitreous post vitrectomy Vitreous syneresis         Fundus Exam       Right Left   Disc Sharp rim, mild pallor, peripapillary drusen, PPP Pink and Sharp, +cupping, +PPP, peripapillary drusen   C/D Ratio 0.5 0.75   Macula flat; ERM/VMT gone, blunted foveal reflex, refractile drusen, trace cystic changes - slightly improved, pigment clumping, no heme or edema Blunted foveal reflex, refractile Drusen, early Atrophy, RPE mottling and clumping, +PEDs/CNV, +cystic changes -- persistent, no heme   Vessels attenuated attenuated, Tortuous   Periphery Attached, scattered peripheral drusen, laser changes superiorly Attached with peripheral drusen           Refraction     Wearing Rx       Sphere Cylinder Axis Add   Right -1.00 +0.75 180 +2.75   Left -0.50 +0.75 004 +2.75            IMAGING AND PROCEDURES  Imaging and Procedures for 02/09/18  OCT, Retina - OU - Both Eyes       Right Eye Quality was good. Central Foveal Thickness: 238. Progression has improved. Findings include retinal drusen , outer retinal atrophy, intraretinal hyper-reflective material, abnormal foveal contour, pigment epithelial detachment, no SRF, intraretinal fluid, subretinal hyper-reflective material (Mild interval improvement in IRF and SRHM IT macula and fovea).   Left  Eye Quality was good. Central Foveal Thickness: 287. Progression has been stable. Findings include vitreous traction, pigment epithelial detachment, abnormal foveal contour, retinal drusen , epiretinal membrane, outer retinal atrophy, no SRF, intraretinal fluid (persistent VMT w/ cystic changes, no active exudative disease).   Notes Images taken, stored on drive  Diagnosis / Impression:  OD: Exudative ARMD -- Mild interval improvement in IRF and SRHM IT macula and fovea OS: exudative ARMD-- no active exudative disease; persistent VMT w/ central cystic changes   Clinical management:  See below  Abbreviations: NFP - Normal foveal profile. CME - cystoid macular edema. PED - pigment epithelial detachment. IRF - intraretinal fluid. SRF - subretinal fluid. EZ - ellipsoid zone. ERM - epiretinal membrane. ORA - outer retinal atrophy. ORT - outer retinal tubulation. SRHM - subretinal hyper-reflective material       Intravitreal Injection, Pharmacologic Agent - OD - Right Eye       Time Out 11/26/2021. 9:19 AM. Confirmed correct patient, procedure, site, and patient consented.   Anesthesia Topical anesthesia was used. Anesthetic medications included Lidocaine 2%, Proparacaine 0.5%.   Procedure Preparation included 5% betadine to ocular surface, eyelid speculum. A supplied needle was used.   Injection: 1.25 mg Bevacizumab 1.53m/0.05ml   Route: Intravitreal, Site: Right Eye   NDC: 50242-060-01, Lot: [id number], Expiration date: 12/23/2021   Post-op Post injection exam found visual acuity of at least counting fingers. The patient tolerated the procedure well. There were no complications. The patient received written and verbal post procedure care education. Post injection medications were not given.            ASSESSMENT/PLAN:    ICD-10-CM   1. Exudative age-related macular degeneration of right eye with active choroidal neovascularization (HCC)  H35.3211 OCT, Retina - OU - Both Eyes     Intravitreal Injection, Pharmacologic Agent - OD - Right Eye  Bevacizumab (AVASTIN) SOLN 1.25 mg    2. Exudative age-related macular degeneration of left eye with active choroidal neovascularization ([place])  H35.3221     3. 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 Pseudophakia of both eyes  Z96.1       1. 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    - S/P IVA OD #1 (12.03.18), #2 (01.02.19), #3 (01.30.19), #4 (03.01.19), #5 (04.01.19), #6 (04.30.19), #7 (06.05.19), #8 (07.19.19), #9 (08.30.19), #10 (10.14.19), #11 (11.18.19), #12 (12.30.19), #13 (02.10.20), #14 (04.06.20), #15 (09.17.20), #16 (10.15.20), #17 (12.01.20), #18 (01.18.21), #19 (03.15.21), #20 (5.11.21), #21 (07.20.21), #22 (10.14.21), #23 (01.10.22), #24 (03.07.22), #24 (05.23.22), #25 (9.1.22), #26 (11.01.22), #27 (12.28.22)  - has had good response to IVA -- has been stable on 3 month interval  - FA (04.01.19) shows mild CNVM OD consistent with exudative ARMD  - OCT shows mild interval improvement in IRF and SRHM IT macula and fovea 7 wks  - BCVA 20/70  - recommend IVA OD #28 today, 02.14.23  - pt wishes to proceed  - RBA of procedure discussed, questions answered  - informed consent obtained and signed  - see procedure note  - Avastin informed consent form signed and scanned on 01.18.2021  - f/u 6-7 wks -- DFE/OCT/possible injection   2. Age related macular degeneration, exudative, OS  - interval conversion from non-exudative to exudative noted 07.20.21  - s/p IVA OS #1 (07.20.21), #2 (08.19.21), #3 (09.16.21), #4 (10.14.21), #5 (11.29.21), #6 (01.10.22), #7 (03.07.22), #8 (05.23.22), #9 (09.01.22)  - exam shows no heme  - OCT with persistent VMT and cystic changes, no active exudative disease (now 5+ mos since last IVA OS)  - BCVA 20/70 today, but pt did not bring glasses  - recommend holding off on IVA OS today  - informed consent obtained, signed and scanned,  07.20.21  3. 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 Pseudophakia OU  - s/p CE/IOL OU  - s/p YAG OU  - beautiful surgeries, doing well  - monitor  Ophthalmic Meds Ordered this visit:  Meds ordered this encounter  Medications   Bevacizumab (AVASTIN) SOLN 1.25 mg     Return for f/u 6-7 weeks, exu ARMD OU, DFE, OCT.  There are no Patient Instructions on file for this visit.  This document serves as a record of services personally performed by Gardiner Sleeper, MD, PhD. It was created on their behalf by San Jetty. Owens Shark, OA an ophthalmic technician. The creation of this record is the provider's dictation and/or activities during the visit.    Electronically signed by: San Jetty. Owens Shark, [place] 02.09.2023 11:10 PM   Gardiner Sleeper, M.D., Ph.D. Diseases & Surgery of the Retina and Vitreous [facility]  I have reviewed the above documentation for accuracy and completeness, and I agree with the above. Gardiner Sleeper, M.D., Ph.D.  11/26/21 11:12 PM   Abbreviations: M myopia (nearsighted); A astigmatism; H hyperopia (farsighted); P presbyopia; Mrx spectacle prescription;  CTL contact lenses; OD right eye; OS left eye; OU both eyes  XT exotropia; ET esotropia; PEK punctate epithelial keratitis; PEE punctate epithelial erosions; DES dry eye syndrome; MGD meibomian gland dysfunction; ATs artificial tears; PFAT's preservative free artificial tears; [place] nuclear sclerotic cataract; PSC posterior subcapsular cataract; ERM epi-retinal membrane; PVD posterior vitreous detachment; RD retinal detachment; DM diabetes mellitus; DR diabetic retinopathy;  NPDR non-proliferative diabetic retinopathy; PDR proliferative diabetic retinopathy; CSME clinically significant macular edema; DME diabetic macular edema; dbh dot blot hemorrhages; CWS cotton wool spot; POAG primary open angle glaucoma; C/D cup-to-disc ratio; HVF humphrey visual field; GVF goldmann visual field; OCT optical coherence tomography; IOP intraocular pressure; BRVO Branch retinal vein occlusion; CRVO central retinal vein occlusion; CRAO central retinal artery occlusion; BRAO branch retinal artery occlusion; RT retinal tear; SB scleral buckle; PPV pars plana vitrectomy; VH Vitreous hemorrhage; PRP panretinal laser photocoagulation; IVK intravitreal kenalog; VMT vitreomacular traction; MH Macular hole;  NVD neovascularization of the disc; NVE neovascularization elsewhere; AREDS age related eye disease study; ARMD age related macular degeneration; POAG primary open angle glaucoma; EBMD epithelial/anterior basement membrane dystrophy; ACIOL anterior chamber intraocular lens; IOL intraocular lens; PCIOL posterior chamber intraocular lens; Phaco/IOL phacoemulsification with intraocular lens placement; [place] photorefractive keratectomy; LASIK laser assisted in situ keratomileusis; HTN hypertension; DM diabetes mellitus; COPD chronic obstructive pulmonary disease

## 2021-11-26 ENCOUNTER — Encounter (INDEPENDENT_AMBULATORY_CARE_PROVIDER_SITE_OTHER): Payer: Self-pay | Admitting: Ophthalmology

## 2021-11-26 ENCOUNTER — Ambulatory Visit (INDEPENDENT_AMBULATORY_CARE_PROVIDER_SITE_OTHER): Payer: Medicare Other | Admitting: Ophthalmology

## 2021-11-26 ENCOUNTER — Other Ambulatory Visit: Payer: Self-pay

## 2021-11-26 DIAGNOSIS — H353211 Exudative age-related macular degeneration, right eye, with active choroidal neovascularization: Secondary | ICD-10-CM

## 2021-11-26 DIAGNOSIS — H353231 Exudative age-related macular degeneration, bilateral, with active choroidal neovascularization: Secondary | ICD-10-CM | POA: Diagnosis not present

## 2021-11-26 DIAGNOSIS — H43823 Vitreomacular adhesion, bilateral: Secondary | ICD-10-CM

## 2021-11-26 DIAGNOSIS — Z961 Presence of intraocular lens: Secondary | ICD-10-CM | POA: Diagnosis not present

## 2021-11-26 DIAGNOSIS — H353221 Exudative age-related macular degeneration, left eye, with active choroidal neovascularization: Secondary | ICD-10-CM

## 2021-11-26 MED ORDER — BEVACIZUMAB CHEMO INJECTION 1.25MG/0.05ML SYRINGE FOR KALEIDOSCOPE
1.2500 mg | INTRAVITREAL | Status: AC | PRN
  Administered 2021-11-26: 1.25 mg via INTRAVITREAL

## 2022-01-02 NOTE — Progress Notes (Addendum)
?[facility] Note ? ?01/07/2022 ? ?  ? ?CHIEF COMPLAINT ?Patient presents for Retina Follow Up ? ? ? ?HISTORY OF PRESENT ILLNESS: ?Lindsay Harvey is a 86 y.o. female who presents to the clinic today for:  ? ?HPI   ? ? 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  Severity is moderate.  Duration of 6.  Since onset it is gradually improving.  I, the attending physician,  performed the HPI with the patient and updated documentation appropriately. ? ?  ?  ? ? 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 Pt states VA is improving but dryness in OD is still noticeable. She uses Refresh Tears BID/TID then GenTeal QHS but is about to switch to Soothe Tears during the day instead.   ? ?  ?  ?Last edited by [name], MD on 01/07/2022  9:53 AM.  ?  ?Pt states vision is stable ? ? ?Referring physician: ?[name], MD ?[address] ?[place],  [place] [address] ? ?HISTORICAL INFORMATION:  ? ?Selected notes from the [place] ?Referred by Dr. [name] for concern of AMD OU, CNV  ? ?CURRENT MEDICATIONS: ?Current Outpatient Medications (Ophthalmic Drugs)  ?Medication Sig  ? carboxymethylcellulose (REFRESH PLUS) 0.5 % SOLN Place 1 drop into both eyes 3 (three) times daily as needed (dry/irritated eyes).   ? REFRESH OPTIVE MEGA-3 0.5-1-0.5 % SOLN Place 1 drop into both eyes 3 (three) times daily as needed (discomfort/eye irritation.).  ? prednisoLONE acetate (PRED FORTE) 1 % ophthalmic suspension Place 1 drop into the right eye 4 (four) times daily. (Patient not taking: Reported on 11/26/2021)  ? ?No current facility-administered medications for this visit. (Ophthalmic Drugs)  ? ?Current Outpatient Medications (Other)  ?Medication Sig  ? calcium carbonate (OSCAL) 1500 (600 Ca) MG TABS tablet Take 600 mg of elemental calcium by mouth 2 (two) times a day.  ? Cholecalciferol (VITAMIN D3) 50 MCG (2000 UT) TABS Take 2,000 Units by mouth daily.  ? famotidine (PEPCID)  20 MG tablet Take 20 mg by mouth daily.  ? Multiple Vitamins-Minerals (EMERGEN-C IMMUNE PO) Take 1 packet by mouth daily as needed (for immune support).  ? Multiple Vitamins-Minerals (PRESERVISION AREDS 2 PO) Take 1 tablet by mouth 2 (two) times a day.   ? omeprazole (PRILOSEC) 40 MG capsule 1 cap(s)  ? simvastatin (ZOCOR) 40 MG tablet Take 10 mg by mouth every evening. 1/4 tablet at night  ? methylPREDNISolone (MEDROL DOSEPAK) 4 MG TBPK tablet Use as directed (Patient not taking: Reported on 11/26/2021)  ? ?Current Facility-Administered Medications (Other)  ?Medication Route  ? Bevacizumab (AVASTIN) SOLN 1.25 mg Intravitreal  ? Bevacizumab (AVASTIN) SOLN 1.25 mg Intravitreal  ? Bevacizumab (AVASTIN) SOLN 1.25 mg Intravitreal  ? Bevacizumab (AVASTIN) SOLN 1.25 mg Intravitreal  ? Bevacizumab (AVASTIN) SOLN 1.25 mg Intravitreal  ? Bevacizumab (AVASTIN) SOLN 1.25 mg Intravitreal  ? Bevacizumab (AVASTIN) SOLN 1.25 mg Intravitreal  ? Bevacizumab (AVASTIN) SOLN 1.25 mg Intravitreal  ? Bevacizumab (AVASTIN) SOLN 1.25 mg Intravitreal  ? Bevacizumab (AVASTIN) SOLN 1.25 mg Intravitreal  ? Bevacizumab (AVASTIN) SOLN 1.25 mg Intravitreal  ? Bevacizumab (AVASTIN) SOLN 1.25 mg Intravitreal  ? Bevacizumab (AVASTIN) SOLN 1.25 mg Intravitreal  ? ?REVIEW OF SYSTEMS: ?ROS   ?Positive for: Gastrointestinal, Eyes ?Negative for: Constitutional, Neurological, Skin, Genitourinary, Musculoskeletal, HENT, Endocrine, Cardiovascular, Respiratory, Psychiatric, Allergic/Imm, Heme/Lymph ?Last edited by Kingsley Spittle, COT  on 01/07/2022  8:23 AM.  ?  ? ?ALLERGIES ?No Known Allergies ? ?PAST MEDICAL HISTORY ?Past Medical History:  ?Diagnosis Date  ? Allergies   ? Cyst of eye   ? 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 Headache   ? Hypercholesterolemia   ? Macular degeneration   ? Wet OU  ? Wears glasses   ? ?Past Surgical History:  ?Procedure Laterality Date  ? 25 GAUGE PARS PLANA VITRECTOMY WITH 20 GAUGE MVR PORT FOR MACULAR HOLE  Right 04/07/2019  ? Procedure: 101 GAUGE PARS PLANA VITRECTOMY WITH 20 GAUGE MVR PORT FOR MACULAR HOLE;  Surgeon: [name], MD;  Location: [facility];  Service: Ophthalmology;  Laterality: Right;  ? CATARACT EXTRACTION Bilateral   ? 2013  ? DILATION AND CURETTAGE OF UTERUS    ? EYE SURGERY Bilateral 2013  ? Cat Sx  ? GAS/FLUID EXCHANGE Right 04/07/2019  ? Procedure: Gas/Fluid Exchange;  Surgeon: [name], MD;  Location: [facility];  Service: Ophthalmology;  Laterality: Right;  ? MEMBRANE PEEL Right 04/07/2019  ? Procedure: [name];  Surgeon: [name], MD;  Location: [facility];  Service: Ophthalmology;  Laterality: Right;  ? PHOTOCOAGULATION WITH LASER Right 04/07/2019  ? Procedure: Photocoagulation With Laser;  Surgeon: [name], MD;  Location: [facility];  Service: Ophthalmology;  Laterality: Right;  ? YAG LASER APPLICATION Bilateral   ? ?FAMILY HISTORY ?Family History  ?Problem Relation Age of Onset  ? Stroke Father   ? ?SOCIAL HISTORY ?Social History  ? ?Tobacco Use  ? Smoking status: Never  ? Smokeless tobacco: Never  ?Vaping Use  ? Vaping Use: Never used  ?Substance Use Topics  ? Alcohol use: No  ? Drug use: No  ?  ? ?  ?OPHTHALMIC EXAM: ?Base Eye Exam   ? ? Visual Acuity (Snellen - Linear)   ? ?   Right Left  ? Dist [place] 20/150 20/60  ? Dist ph [place] 20/70 -1 20/50 -2  ? ?  ?  ? ? Tonometry (Tonopen, 8:33 AM)   ? ?   Right Left  ? Pressure 13 13  ? ?  ?  ? ? Pupils   ? ?   Dark Light Shape React APD  ? Right 3 2 Round Brisk None  ? Left 3 2 Round Brisk None  ? ?  ?  ? ? Visual Fields (Counting fingers)   ? ?   Left Right  ?  Full Full  ? ?  ?  ? ? Extraocular Movement   ? ?   Right Left  ?  Full, Ortho Full, Ortho  ? ?  ?  ? ? Neuro/Psych   ? ? Oriented x3: Yes  ? Mood/Affect: Normal  ? ?  ?  ? ? Dilation   ? ? Both eyes: 1.0% Mydriacyl, 2.5% Phenylephrine @ 8:33 AM  ? ?  ?  ? ?  ? ?Slit Lamp and Fundus Exam   ? ? Slit Lamp Exam   ? ?   Right Left  ? Lids/Lashes Dermatochalasis - upper lid, periorbital edema  Dermatochalasis - upper lid  ? Conjunctiva/Sclera White and quiet White and quiet  ? Cornea Arcus, 1+ fine Punctate epithelial erosions, mild endo pigment Arcus, 3+ Punctate epithelial erosions, mild tear film debris, mild endo pigment, dry tear fil, decreased TBUT  ? Anterior Chamber Deep, quiet Deep and quiet  ? Iris Round and dilated Round and dilated  ? Lens PC IOL in good  postion with anterior capsular phimosis, 1+ Posterior capsular opacification PC IOL in good postion with anterior capsular phimosis, 1+ Posterior capsular opacification sparing center  ? Anterior Vitreous post vitrectomy Vitreous syneresis  ? ?  ?  ? ? Fundus Exam   ? ?   Right Left  ? Disc Sharp rim, mild pallor, peripapillary drusen, PPP Pink and Sharp, +cupping, +PPP, peripapillary drusen  ? C/D Ratio 0.5 0.75  ? Macula flat; ERM/VMT gone, blunted foveal reflex, refractile drusen, trace cystic changes - slightly improved, pigment clumping, focal IRH centrally Blunted foveal reflex, refractile Drusen, early Atrophy, RPE mottling and clumping, +PEDs/CNV, +cystic changes -- increased inferior macula, no heme  ? Vessels attenuated attenuated, Tortuous  ? Periphery Attached, scattered peripheral drusen, laser changes superiorly Attached with peripheral drusen  ? ?  ?  ? ?  ? ?IMAGING AND PROCEDURES  ?Imaging and Procedures for 02/09/18 ? ?OCT, Retina - OU - Both Eyes   ? ?   ?Right Eye ?Quality was good. Central Foveal Thickness: 240. Progression has improved. Findings include retinal drusen , outer retinal atrophy, intraretinal hyper-reflective material, abnormal foveal contour, pigment epithelial detachment, no SRF, subretinal hyper-reflective material, no IRF (Mild interval improvement in IRF and SRHM IT macula and fovea).  ? ?Left Eye ?Quality was good. Central Foveal Thickness: 294. Progression has worsened. Findings include vitreous traction, pigment epithelial detachment, abnormal foveal contour, retinal drusen , epiretinal membrane,  outer retinal atrophy, no SRF, intraretinal fluid (Interval re-development of IRF inferior macular, persistent VMT w/ cystic changes).  ? ?Notes ?Images taken, stored on drive ? ?Diagnosis / Impression:  ?OD:

## 2022-01-07 ENCOUNTER — Ambulatory Visit (INDEPENDENT_AMBULATORY_CARE_PROVIDER_SITE_OTHER): Payer: Medicare Other | Admitting: Ophthalmology

## 2022-01-07 ENCOUNTER — Encounter (INDEPENDENT_AMBULATORY_CARE_PROVIDER_SITE_OTHER): Payer: Self-pay | Admitting: Ophthalmology

## 2022-01-07 ENCOUNTER — Other Ambulatory Visit: Payer: Self-pay

## 2022-01-07 DIAGNOSIS — H353231 Exudative age-related macular degeneration, bilateral, with active choroidal neovascularization: Secondary | ICD-10-CM

## 2022-01-07 DIAGNOSIS — Z961 Presence of intraocular lens: Secondary | ICD-10-CM | POA: Diagnosis not present

## 2022-01-07 DIAGNOSIS — H43823 Vitreomacular adhesion, bilateral: Secondary | ICD-10-CM

## 2022-01-07 DIAGNOSIS — H353221 Exudative age-related macular degeneration, left eye, with active choroidal neovascularization: Secondary | ICD-10-CM

## 2022-01-07 DIAGNOSIS — H353211 Exudative age-related macular degeneration, right eye, with active choroidal neovascularization: Secondary | ICD-10-CM

## 2022-01-07 MED ORDER — BEVACIZUMAB CHEMO INJECTION 1.25MG/0.05ML SYRINGE FOR KALEIDOSCOPE
1.2500 mg | INTRAVITREAL | Status: AC | PRN
  Administered 2022-01-07: 1.25 mg via INTRAVITREAL

## 2022-02-05 NOTE — Progress Notes (Signed)
?[facility] Note ? ?02/18/2022 ? ?  ? ?CHIEF COMPLAINT ?Patient presents for Retina Follow Up ? ? ? ?HISTORY OF PRESENT ILLNESS: ?Lindsay Harvey is a 86 y.o. female who presents to the clinic today for:  ? ?HPI   ? ? 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  In right eye.  This started years ago.  Severity is moderate.  Duration of 6 weeks.  Since onset it is gradually improving.  I, the attending physician,  performed the HPI with the patient and updated documentation appropriately. ? ?  ?  ? ? Comments   ?Patient feels that the vision has remained unchanged since her last visit 6 weeks ago. She is using Genteal Gel OU QHS and Soothe OU PRN through the day. ? ?  ?  ?Last edited by Rennis Chris, MD on 02/18/2022  9:29 AM.  ?  ? ?Pt states vision is stable ? ? ?Referring physician: ?Ralene Ok, MD ?[address] ?[place],  [place] [address] ? ?HISTORICAL INFORMATION:  ? ?Selected notes from the MEDICAL RECORD NUMBER ?Referred by Dr. Marcha Solders for concern of AMD OU, CNV  ? ?CURRENT MEDICATIONS: ?Current Outpatient Medications (Ophthalmic Drugs)  ?Medication Sig  ? carboxymethylcellulose (REFRESH PLUS) 0.5 % SOLN Place 1 drop into both eyes 3 (three) times daily as needed (dry/irritated eyes).   ? prednisoLONE acetate (PRED FORTE) 1 % ophthalmic suspension Place 1 drop into the right eye 4 (four) times daily. (Patient not taking: Reported on 11/26/2021)  ? REFRESH OPTIVE MEGA-3 0.5-1-0.5 % SOLN Place 1 drop into both eyes 3 (three) times daily as needed (discomfort/eye irritation.).  ? ?No current facility-administered medications for this visit. (Ophthalmic Drugs)  ? ?Current Outpatient Medications (Other)  ?Medication Sig  ? calcium carbonate (OSCAL) 1500 (600 Ca) MG TABS tablet Take 600 mg of elemental calcium by mouth 2 (two) times a day.  ? Cholecalciferol (VITAMIN D3) 50 MCG (2000 UT) TABS Take 2,000 Units by mouth daily.  ? famotidine (PEPCID) 20 MG tablet  Take 20 mg by mouth daily.  ? methylPREDNISolone (MEDROL DOSEPAK) 4 MG TBPK tablet Use as directed (Patient not taking: Reported on 11/26/2021)  ? Multiple Vitamins-Minerals (EMERGEN-C IMMUNE PO) Take 1 packet by mouth daily as needed (for immune support).  ? Multiple Vitamins-Minerals (PRESERVISION AREDS 2 PO) Take 1 tablet by mouth 2 (two) times a day.   ? omeprazole (PRILOSEC) 40 MG capsule 1 cap(s)  ? simvastatin (ZOCOR) 40 MG tablet Take 10 mg by mouth every evening. 1/4 tablet at night  ? ?Current Facility-Administered Medications (Other)  ?Medication Route  ? Bevacizumab (AVASTIN) SOLN 1.25 mg Intravitreal  ? Bevacizumab (AVASTIN) SOLN 1.25 mg Intravitreal  ? Bevacizumab (AVASTIN) SOLN 1.25 mg Intravitreal  ? Bevacizumab (AVASTIN) SOLN 1.25 mg Intravitreal  ? Bevacizumab (AVASTIN) SOLN 1.25 mg Intravitreal  ? Bevacizumab (AVASTIN) SOLN 1.25 mg Intravitreal  ? Bevacizumab (AVASTIN) SOLN 1.25 mg Intravitreal  ? Bevacizumab (AVASTIN) SOLN 1.25 mg Intravitreal  ? Bevacizumab (AVASTIN) SOLN 1.25 mg Intravitreal  ? Bevacizumab (AVASTIN) SOLN 1.25 mg Intravitreal  ? Bevacizumab (AVASTIN) SOLN 1.25 mg Intravitreal  ? Bevacizumab (AVASTIN) SOLN 1.25 mg Intravitreal  ? Bevacizumab (AVASTIN) SOLN 1.25 mg Intravitreal  ? ?REVIEW OF SYSTEMS: ?ROS   ?Positive for: Gastrointestinal, Eyes ?Negative for: Constitutional, Neurological, Skin, Genitourinary, Musculoskeletal, HENT, Endocrine, Cardiovascular, Respiratory, Psychiatric, Allergic/Imm, Heme/Lymph ?Last edited by Julieanne Cotton, COT on 02/18/2022  8:23 AM.  ?  ? ? ?  ALLERGIES ?No Known Allergies ? ?PAST MEDICAL HISTORY ?Past Medical History:  ?Diagnosis Date  ? Allergies   ? Cyst of eye   ? 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 Headache   ? Hypercholesterolemia   ? Macular degeneration   ? Wet OU  ? Wears glasses   ? ?Past Surgical History:  ?Procedure Laterality Date  ? 25 GAUGE PARS PLANA VITRECTOMY WITH 20 GAUGE MVR PORT FOR MACULAR HOLE Right  04/07/2019  ? Procedure: 25 GAUGE PARS PLANA VITRECTOMY WITH 20 GAUGE MVR PORT FOR MACULAR HOLE;  Surgeon: Rennis Chris, MD;  [facility];  Service: Ophthalmology;  Laterality: Right;  ? CATARACT EXTRACTION Bilateral   ? 2013  ? DILATION AND CURETTAGE OF UTERUS    ? EYE SURGERY Bilateral 2013  ? Cat Sx  ? GAS/FLUID EXCHANGE Right 04/07/2019  ? Procedure: Gas/Fluid Exchange;  Surgeon: Rennis Chris, MD;  Location: [facility] OR;  Service: Ophthalmology;  Laterality: Right;  ? MEMBRANE PEEL Right 04/07/2019  ? Procedure: Eula Flax;  Surgeon: Rennis Chris, MD;  Location: [facility] OR;  Service: Ophthalmology;  Laterality: Right;  ? PHOTOCOAGULATION WITH LASER Right 04/07/2019  ? Procedure: Photocoagulation With Laser;  Surgeon: Rennis Chris, MD;  Location: [facility] OR;  Service: Ophthalmology;  Laterality: Right;  ? YAG LASER APPLICATION Bilateral   ? ?FAMILY HISTORY ?Family History  ?Problem Relation Age of Onset  ? Stroke Father   ? ?SOCIAL HISTORY ?Social History  ? ?Tobacco Use  ? Smoking status: Never  ? Smokeless tobacco: Never  ?Vaping Use  ? Vaping Use: Never used  ?Substance Use Topics  ? Alcohol use: No  ? Drug use: No  ?  ? ?  ?OPHTHALMIC EXAM: ?Base Eye Exam   ? ? Visual Acuity (Snellen - Linear)   ? ?   Right Left  ? Dist cc 20/100 20/50  ? Dist ph cc NI NI  ? ? Correction: Glasses  ? ?  ?  ? ? Tonometry (Tonopen, 8:29 AM)   ? ?   Right Left  ? Pressure 13 16  ? ?  ?  ? ? Pupils   ? ?   Dark Light Shape React APD  ? Right 3 2 Round Brisk None  ? Left 3 2 Round Brisk None  ? ?  ?  ? ? Visual Fields   ? ?   Left Right  ?  Full Full  ? ?  ?  ? ? Extraocular Movement   ? ?   Right Left  ?  Full Full  ? ?  ?  ? ? Neuro/Psych   ? ? Oriented x3: Yes  ? Mood/Affect: Normal  ? ?  ?  ? ? Dilation   ? ? Both eyes: 1.0% Mydriacyl, 2.5% Phenylephrine @ 8:26 AM  ? ?  ?  ? ?  ? ?Slit Lamp and Fundus Exam   ? ? Slit Lamp Exam   ? ?   Right Left  ? Lids/Lashes Dermatochalasis - upper lid, periorbital edema Dermatochalasis - upper lid  ?  Conjunctiva/Sclera White and quiet White and quiet  ? Cornea Arcus, 1+ fine Punctate epithelial erosions, mild endo pigment Arcus, 3+ Punctate epithelial erosions, mild tear film debris, mild endo pigment, dry tear fil, decreased TBUT  ? Anterior Chamber Deep, quiet Deep and quiet  ? Iris Round and dilated Round and dilated  ? Lens PC IOL in good postion with anterior capsular phimosis, 1+ Posterior capsular opacification PC  IOL in good postion with anterior capsular phimosis, 1+ Posterior capsular opacification sparing center  ? Anterior Vitreous post vitrectomy Vitreous syneresis  ? ?  ?  ? ? Fundus Exam   ? ?   Right Left  ? Disc Sharp rim, mild pallor, peripapillary drusen, PPP Pink and Sharp, central pallor, +cupping, +PPP, peripapillary drusen  ? C/D Ratio 0.5 0.75  ? Macula flat; ERM/VMT gone, blunted foveal reflex, refractile drusen, trace cystic changes - increased, pigment clumping, focal IRH centrally - slightly increased Blunted foveal reflex, refractile Drusen, early Atrophy, RPE mottling and clumping, +PEDs/CNV, +cystic changes -- improved, no heme  ? Vessels attenuated attenuated, Tortuous  ? Periphery Attached, scattered peripheral drusen, laser changes superiorly Attached with peripheral drusen  ? ?  ?  ? ?  ? ?Refraction   ? ? Wearing Rx   ? ?   Sphere Cylinder Axis Add  ? Right -1.00 +0.75 180 +2.75  ? Left -0.50 +0.75 004 +2.75  ? ?  ?  ? ?  ? ?IMAGING AND PROCEDURES  ?Imaging and Procedures for 02/09/18 ? ?OCT, Retina - OU - Both Eyes   ? ?   ?Right Eye ?Quality was good. Central Foveal Thickness: 289. Progression has worsened. Findings include retinal drusen , outer retinal atrophy, intraretinal hyper-reflective material, abnormal foveal contour, pigment epithelial detachment, no SRF, subretinal hyper-reflective material, no IRF (Mild interval re-development of IRF and SRHM temporal macula ).  ? ?Left Eye ?Quality was good. Central Foveal Thickness: 292. Progression has improved. Findings  include vitreous traction, pigment epithelial detachment, abnormal foveal contour, retinal drusen , epiretinal membrane, outer retinal atrophy, no SRF, intraretinal fluid (Interval improvement in IRF inferio

## 2022-02-18 ENCOUNTER — Ambulatory Visit (INDEPENDENT_AMBULATORY_CARE_PROVIDER_SITE_OTHER): Payer: Medicare Other | Admitting: Ophthalmology

## 2022-02-18 ENCOUNTER — Encounter (INDEPENDENT_AMBULATORY_CARE_PROVIDER_SITE_OTHER): Payer: Self-pay | Admitting: Ophthalmology

## 2022-02-18 DIAGNOSIS — H43823 Vitreomacular adhesion, bilateral: Secondary | ICD-10-CM

## 2022-02-18 DIAGNOSIS — Z961 Presence of intraocular lens: Secondary | ICD-10-CM | POA: Diagnosis not present

## 2022-02-18 DIAGNOSIS — H353231 Exudative age-related macular degeneration, bilateral, with active choroidal neovascularization: Secondary | ICD-10-CM

## 2022-02-18 DIAGNOSIS — H353221 Exudative age-related macular degeneration, left eye, with active choroidal neovascularization: Secondary | ICD-10-CM

## 2022-02-18 DIAGNOSIS — H353211 Exudative age-related macular degeneration, right eye, with active choroidal neovascularization: Secondary | ICD-10-CM

## 2022-02-18 MED ORDER — BEVACIZUMAB CHEMO INJECTION 1.25MG/0.05ML SYRINGE FOR KALEIDOSCOPE
1.2500 mg | INTRAVITREAL | Status: AC | PRN
  Administered 2022-02-18: 1.25 mg via INTRAVITREAL

## 2022-02-18 MED ORDER — AFLIBERCEPT 2MG/0.05ML IZ SOLN FOR KALEIDOSCOPE
2.0000 mg | INTRAVITREAL | Status: AC | PRN
  Administered 2022-02-18: 2 mg via INTRAVITREAL

## 2022-03-19 NOTE — Progress Notes (Signed)
[facility] - Clinic Note  03/26/2022     CHIEF COMPLAINT Patient presents for Retina Follow Up    HISTORY OF PRESENT ILLNESS: Lindsay Harvey is a 86 y.o. female who presents to the clinic today for:   HPI     Retina Follow Up   Patient presents with  Wet AMD (IVE OD #1 05.09.23).  In right eye.  This started years ago.  Severity is moderate.  Duration of 4 weeks.  Since onset it is gradually improving.  I, the attending physician,  performed the HPI with the patient and updated documentation appropriately.        Comments   Patient states that the vision is the same. Patient states that she is seeing floaters in the right eye.       Last edited by Rennis ChrisZamora, Aubre Quincy, MD on 03/26/2022 11:38 PM.    Patient had some discomfort for about a week after Eylea OD.  Referring physician: Ralene OkMoreira, Roy, MD [address] [facility] [address] [place] [id number]  HISTORICAL INFORMATION:   Selected notes from the MEDICAL RECORD NUMBER Referred by Dr. Marcha Solders. [name] for concern of AMD OU, CNV   CURRENT MEDICATIONS: Current Outpatient Medications (Ophthalmic Drugs)  Medication Sig   carboxymethylcellulose (REFRESH PLUS) 0.5 % SOLN Place 1 drop into both eyes 3 (three) times daily as needed (dry/irritated eyes).    prednisoLONE acetate (PRED FORTE) 1 % ophthalmic suspension Place 1 drop into the right eye 4 (four) times daily.   REFRESH OPTIVE MEGA-3 0.5-1-0.5 % SOLN Place 1 drop into both eyes 3 (three) times daily as needed (discomfort/eye irritation.).   No current facility-administered medications for this visit. (Ophthalmic Drugs)   Current Outpatient Medications (Other)  Medication Sig   calcium carbonate (OSCAL) 1500 (600 Ca) MG TABS tablet Take 600 mg of elemental calcium by mouth 2 (two) times a day.   Cholecalciferol (VITAMIN D3) 50 MCG (2000 UT) TABS Take 2,000 Units by mouth daily.   famotidine (PEPCID) 20 MG tablet Take 20 mg by mouth daily.   methylPREDNISolone (MEDROL  DOSEPAK) 4 MG TBPK tablet Use as directed   Multiple Vitamins-Minerals (EMERGEN-C IMMUNE PO) Take 1 packet by mouth daily as needed (for immune support).   Multiple Vitamins-Minerals (PRESERVISION AREDS 2 PO) Take 1 tablet by mouth 2 (two) times a day.    omeprazole (PRILOSEC) 40 MG capsule 1 cap(s)   simvastatin (ZOCOR) 40 MG tablet Take 10 mg by mouth every evening. 1/4 tablet at night   Current Facility-Administered Medications (Other)  Medication Route   Bevacizumab (AVASTIN) SOLN 1.25 mg Intravitreal   Bevacizumab (AVASTIN) SOLN 1.25 mg Intravitreal   Bevacizumab (AVASTIN) SOLN 1.25 mg Intravitreal   Bevacizumab (AVASTIN) SOLN 1.25 mg Intravitreal   Bevacizumab (AVASTIN) SOLN 1.25 mg Intravitreal   Bevacizumab (AVASTIN) SOLN 1.25 mg Intravitreal   Bevacizumab (AVASTIN) SOLN 1.25 mg Intravitreal   Bevacizumab (AVASTIN) SOLN 1.25 mg Intravitreal   Bevacizumab (AVASTIN) SOLN 1.25 mg Intravitreal   Bevacizumab (AVASTIN) SOLN 1.25 mg Intravitreal   Bevacizumab (AVASTIN) SOLN 1.25 mg Intravitreal   Bevacizumab (AVASTIN) SOLN 1.25 mg Intravitreal   Bevacizumab (AVASTIN) SOLN 1.25 mg Intravitreal   REVIEW OF SYSTEMS: ROS   Positive for: Gastrointestinal, Eyes Negative for: Constitutional, Neurological, Skin, Genitourinary, Musculoskeletal, HENT, Endocrine, Cardiovascular, Respiratory, Psychiatric, Allergic/Imm, Heme/Lymph Last edited by Julieanne CottonShamlian, Christine N, COT on 03/26/2022  8:37 AM.     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   Headache    Hypercholesterolemia    Macular degeneration    Wet OU   Wears glasses    Past Surgical History:  Procedure Laterality Date   25 GAUGE PARS PLANA VITRECTOMY WITH 20 GAUGE MVR PORT FOR MACULAR HOLE Right 04/07/2019   Procedure: 25 GAUGE PARS PLANA VITRECTOMY WITH 20 GAUGE MVR PORT FOR MACULAR HOLE;  Surgeon:  Rennis Chris, MD;  Location: [facility] OR;  Service: Ophthalmology;  Laterality: Right;   CATARACT EXTRACTION Bilateral    2013   DILATION AND CURETTAGE OF UTERUS     EYE SURGERY Bilateral 2013   Cat Sx   GAS/FLUID EXCHANGE Right 04/07/2019   Procedure: Gas/Fluid Exchange;  Surgeon: Rennis Chris, MD;  Location: [facility] OR;  Service: Ophthalmology;  Laterality: Right;   MEMBRANE PEEL Right 04/07/2019   Procedure: Eula Flax;  Surgeon: Rennis Chris, MD;  Location: [facility] OR;  Service: Ophthalmology;  Laterality: Right;   PHOTOCOAGULATION WITH LASER Right 04/07/2019   Procedure: Photocoagulation With Laser;  Surgeon: Rennis Chris, MD;  Location: [facility] OR;  Service: Ophthalmology;  Laterality: Right;   YAG LASER APPLICATION Bilateral    FAMILY HISTORY Family History  Problem Relation Age of Onset   Stroke Father    SOCIAL HISTORY Social History   Tobacco Use   Smoking status: Never   Smokeless tobacco: Never  Vaping Use   Vaping Use: Never used  Substance Use Topics   Alcohol use: No   Drug use: No       OPHTHALMIC EXAM: Base Eye Exam     Visual Acuity (Snellen - Linear)       Right Left   Dist [place] 20/80 +2 20/50   Dist ph [place] NI NI  Forgot glasses        Tonometry (Tonopen, 8:40 AM)       Right Left   Pressure 14 15         Pupils       Pupils Dark Light Shape React APD   Right PERRL 3 2 Round Brisk None   Left PERRL 3 2 Round Brisk None         Visual Fields       Left Right    Full Full         Extraocular Movement       Right Left    Full, Ortho Full, Ortho         Neuro/Psych     Oriented x3: Yes   Mood/Affect: Normal         Dilation     Both eyes: 2.5% Phenylephrine, 1.0% Mydriacyl @ 8:37 AM           Slit Lamp and Fundus Exam     Slit Lamp Exam       Right Left   Lids/Lashes Dermatochalasis - upper lid, periorbital edema Dermatochalasis - upper lid   Conjunctiva/Sclera White and quiet White and quiet   Cornea Arcus, 1+ fine  Punctate epithelial erosions, mild endo pigment Arcus, 3+ Punctate epithelial erosions, mild tear film debris, mild endo pigment, dry tear fil, decreased TBUT   Anterior Chamber Deep, quiet Deep and quiet   Iris Round and dilated Round and dilated   Lens PC IOL in good postion with anterior capsular phimosis, 1+ Posterior capsular opacification PC IOL in good postion with anterior capsular phimosis, 1+ Posterior capsular opacification sparing  center   Anterior Vitreous post vitrectomy Vitreous syneresis         Fundus Exam       Right Left   Disc Sharp rim, mild pallor, peripapillary drusen, PPP Pink and Sharp, central pallor, +cupping, +PPP, peripapillary drusen   C/D Ratio 0.5 0.75   Macula flat; ERM/VMT gone, blunted foveal reflex, refractile drusen, trace cystic changes - improved, pigment clumping, focal IRH centrally - slightly improved Blunted foveal reflex, refractile Drusen, early Atrophy, RPE mottling and clumping, +PEDs/CNV, +cystic changes -- stably improved, no heme   Vessels attenuated attenuated, Tortuous   Periphery Attached, scattered peripheral drusen, peripheral laser changes superiorly  Attached with peripheral drusen           Refraction     Wearing Rx       Sphere Cylinder Axis Add   Right -1.00 +0.75 180 +2.75   Left -0.50 +0.75 004 +2.75           IMAGING AND PROCEDURES  Imaging and Procedures for 02/09/18  OCT, Retina - OU - Both Eyes       Right Eye Quality was good. Central Foveal Thickness: 235. Progression has improved. Findings include no IRF, no SRF, abnormal foveal contour, retinal drusen , subretinal hyper-reflective material, intraretinal hyper-reflective material, pigment epithelial detachment, outer retinal atrophy (Interval improvement in IRF and SRHM temporal macula ).   Left Eye Quality was good. Central Foveal Thickness: 227. Progression has been stable. Findings include no SRF, abnormal foveal contour, retinal drusen , epiretinal  membrane, intraretinal fluid, pigment epithelial detachment, vitreous traction, outer retinal atrophy (Stable improvement in IRF inferior macula, persistent VMT w/ cystic changes).   Notes Images taken, stored on drive  Diagnosis / Impression:  OD: Exudative ARMD -- Interval improvement in IRF and SRHM temporal macula  OS: exudative ARMD -- Stable improvement in IRF inferior macula, persistent VMT w/ cystic changes  Clinical management:  See below  Abbreviations: NFP - Normal foveal profile. CME - cystoid macular edema. PED - pigment epithelial detachment. IRF - intraretinal fluid. SRF - subretinal fluid. EZ - ellipsoid zone. ERM - epiretinal membrane. ORA - outer retinal atrophy. ORT - outer retinal tubulation. SRHM - subretinal hyper-reflective material       Intravitreal Injection, Pharmacologic Agent - OD - Right Eye       Time Out 03/26/2022. 9:44 AM. Confirmed correct patient, procedure, site, and patient consented.   Anesthesia Topical anesthesia was used. Anesthetic medications included Lidocaine 2%, Proparacaine 0.5%.   Procedure Preparation included 5% betadine to ocular surface, eyelid speculum. A (32g) needle was used.   Injection: 2 mg aflibercept 2 MG/0.05ML   Route: Intravitreal, Site: Right Eye   NDC: L6038910, Lot: [id number], Expiration date: 01/11/2023, Waste: 0 mL   Post-op Post injection exam found visual acuity of at least counting fingers. The patient tolerated the procedure well. There were no complications. The patient received written and verbal post procedure care education. Post injection medications were not given.      Intravitreal Injection, Pharmacologic Agent - OS - Left Eye       Time Out 03/26/2022. 9:44 AM. Confirmed correct patient, procedure, site, and patient consented.   Anesthesia Topical anesthesia was used. Anesthetic medications included Lidocaine 2%, Proparacaine 0.5%.   Procedure Preparation included 5% betadine to ocular  surface, eyelid speculum. A (32g) needle was used.   Injection: 1.25 mg Bevacizumab 1.25mg /0.46ml   Route: Intravitreal, Site: Left Eye   NDC: P3213405, Lot: [id number], Expiration date: 05/10/2022  Post-op Post injection exam found visual acuity of at least counting fingers. The patient tolerated the procedure well. There were no complications. The patient received written and verbal post procedure care education. Post injection medications were not given.             ASSESSMENT/PLAN:    ICD-10-CM   1. 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 Exudative age-related macular degeneration of left eye with active choroidal neovascularization (HCC)  H35.3221 OCT, Retina - OU - Both Eyes    Intravitreal Injection, Pharmacologic Agent - OS - Left Eye    Bevacizumab (AVASTIN) SOLN 1.25 mg    3. 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 Pseudophakia of both eyes  Z96.1       1. 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  - S/P IVA OD #1 (12.03.18), #2 (01.02.19), #3 (01.30.19), #4 (03.01.19), #5 (04.01.19), #6 (04.30.19), #7 (06.05.19), #8 (07.19.19), #9 (08.30.19), #10 (10.14.19), #11 (11.18.19), #12 (12.30.19), #13 (02.10.20), #14 (04.06.20), #15 (09.17.20), #16 (10.15.20), #17 (12.01.20), #18 (01.18.21), #19 (03.15.21), #20 (5.11.21), #21 (07.20.21), #22 (10.14.21), #23 (01.10.22), #24 (03.07.22), #24 (05.23.22), #25 (9.1.22), #26 (11.01.22), #27 (12.28.22), # 28 (02.14.23), #29 (03.28.23)  - s/p IVE OD #1 (05.09.23) sample  - historically good response to IVA -- has previously been stable on 3 month interval  - FA (04.01.19) shows mild CNVM OD consistent with exudative ARMD  - OCT shows OD: interval improvement in IRF and SRHM  temporal macula -- good response to IVE #1  - BCVA 20/80+ from 20/100   - recommend IVE OD #2 today, 06.14.23  - pt wishes to proceed with Eylea sample  - RBA of procedure discussed, questions answered  - see procedure note  - Eylea informed consent form signed and scanned on 05.09.23 (OU)  - f/u April 23, 2022 -- DFE/OCT/possible injection (pt traveling to [place] in July)  2. Age related macular degeneration, exudative, OS  - interval conversion from non-exudative to exudative noted 07.20.21  - s/p IVA OS #1 (07.20.21), #2 (08.19.21), #3 (09.16.21), #4 (10.14.21), #5 (11.29.21), #6 (01.10.22), #7 (03.07.22), #8 (05.23.22), #9 (09.01.22), #10 (3.28.23), #11 (05.09.23)  - exam shows no heme  - OCT with stable improvement in IRF inferior macular, persistent VMT w/ cystic changes  - BCVA 20/50 today -- stable  - recommend IVA OS #12 today, 06.14.23  - RBA of procedure discussed, questions answered - informed consent obtained and signed - see procedure note - IVA informed consent obtained, signed and scanned, 07.20.21 - f/u 4 weeks, DFE, OCT  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 Pseudophakia OU  - s/p CE/IOL OU  -  s/p YAG OU  - beautiful surgeries, doing well  - monitor  Ophthalmic Meds Ordered this visit:  Meds ordered this encounter  Medications   Bevacizumab (AVASTIN) SOLN 1.25 mg   aflibercept (EYLEA) SOLN 2 mg     Return in about 4 weeks (around 04/23/2022) for DFE, OCT, possible injection.  There are no Patient Instructions on file for this visit.  This document serves as a record of services personally performed by Karie Chimera, MD, PhD. It was created on their behalf by De Blanch, an ophthalmic technician. The creation of this record is the provider's dictation and/or activities during the visit.    Electronically signed by: De Blanch, OA, 03/26/22  11:57 PM   Karie Chimera, M.D., Ph.D. Diseases & Surgery of the Retina and Vitreous [facility]  I have reviewed the above documentation for accuracy and completeness, and I agree with the above. Karie Chimera, M.D., Ph.D. 03/27/22 12:00 AM  Abbreviations: M myopia (nearsighted); A astigmatism; H hyperopia (farsighted); P presbyopia; Mrx spectacle prescription;  CTL contact lenses; OD right eye; OS left eye; OU both eyes  XT exotropia; ET esotropia; PEK punctate epithelial keratitis; PEE punctate epithelial erosions; DES dry eye syndrome; MGD meibomian gland dysfunction; ATs artificial tears; PFAT's preservative free artificial tears; NSC nuclear sclerotic cataract; PSC posterior subcapsular cataract; ERM epi-retinal membrane; PVD posterior vitreous detachment; RD retinal detachment; DM diabetes mellitus; DR diabetic retinopathy; NPDR non-proliferative diabetic retinopathy; PDR proliferative diabetic retinopathy; CSME clinically significant macular edema; DME diabetic macular edema; dbh dot blot hemorrhages; CWS cotton wool spot; POAG primary open angle glaucoma; C/D cup-to-disc ratio; HVF humphrey visual field; GVF goldmann visual field; OCT optical coherence tomography; IOP intraocular pressure; BRVO Branch retinal vein occlusion; CRVO central retinal vein occlusion; CRAO central retinal artery occlusion; BRAO branch retinal artery occlusion; RT retinal tear; SB scleral buckle; PPV pars plana vitrectomy; VH Vitreous hemorrhage; PRP panretinal laser photocoagulation; IVK intravitreal kenalog; VMT vitreomacular traction; MH Macular hole;  NVD neovascularization of the disc; NVE neovascularization elsewhere; AREDS age related eye  disease study; ARMD age related macular degeneration; POAG primary open angle glaucoma; EBMD epithelial/anterior basement membrane dystrophy; ACIOL anterior chamber intraocular lens; IOL intraocular lens; PCIOL posterior chamber intraocular lens; Phaco/IOL phacoemulsification with intraocular lens placement; PRK photorefractive keratectomy; LASIK laser assisted in situ keratomileusis; HTN hypertension; DM diabetes mellitus; COPD chronic obstructive pulmonary disease

## 2022-03-26 ENCOUNTER — Ambulatory Visit (INDEPENDENT_AMBULATORY_CARE_PROVIDER_SITE_OTHER): Payer: Medicare Other | Admitting: Ophthalmology

## 2022-03-26 ENCOUNTER — Encounter (INDEPENDENT_AMBULATORY_CARE_PROVIDER_SITE_OTHER): Payer: Self-pay | Admitting: Ophthalmology

## 2022-03-26 DIAGNOSIS — H353231 Exudative age-related macular degeneration, bilateral, with active choroidal neovascularization: Secondary | ICD-10-CM

## 2022-03-26 DIAGNOSIS — Z961 Presence of intraocular lens: Secondary | ICD-10-CM | POA: Diagnosis not present

## 2022-03-26 DIAGNOSIS — H43823 Vitreomacular adhesion, bilateral: Secondary | ICD-10-CM | POA: Diagnosis not present

## 2022-03-26 DIAGNOSIS — H353221 Exudative age-related macular degeneration, left eye, with active choroidal neovascularization: Secondary | ICD-10-CM

## 2022-03-26 DIAGNOSIS — H353211 Exudative age-related macular degeneration, right eye, with active choroidal neovascularization: Secondary | ICD-10-CM

## 2022-03-26 MED ORDER — AFLIBERCEPT 2MG/0.05ML IZ SOLN FOR KALEIDOSCOPE
2.0000 mg | INTRAVITREAL | Status: AC | PRN
  Administered 2022-03-26: 2 mg via INTRAVITREAL

## 2022-03-26 MED ORDER — BEVACIZUMAB CHEMO INJECTION 1.25MG/0.05ML SYRINGE FOR KALEIDOSCOPE
1.2500 mg | INTRAVITREAL | Status: AC | PRN
  Administered 2022-03-26: 1.25 mg via INTRAVITREAL

## 2022-03-27 ENCOUNTER — Ambulatory Visit: Payer: Medicare Other | Admitting: Cardiology

## 2022-03-27 ENCOUNTER — Encounter: Payer: Self-pay | Admitting: Cardiology

## 2022-03-27 VITALS — BP 146/77 | HR 82 | Temp 98.7°F | Resp 16 | Ht 62.0 in | Wt 112.0 lb

## 2022-03-27 DIAGNOSIS — R0609 Other forms of dyspnea: Secondary | ICD-10-CM

## 2022-03-27 DIAGNOSIS — R072 Precordial pain: Secondary | ICD-10-CM

## 2022-03-27 DIAGNOSIS — I10 Essential (primary) hypertension: Secondary | ICD-10-CM

## 2022-03-27 MED ORDER — NITROGLYCERIN 0.4 MG SL SUBL: 0.4000 mg | SUBLINGUAL_TABLET | SUBLINGUAL | 3 refills | Status: AC | PRN

## 2022-03-27 MED ORDER — ASPIRIN 81 MG PO TBEC: 81.0000 mg | DELAYED_RELEASE_TABLET | Freq: Every day | ORAL | 3 refills | Status: AC

## 2022-03-27 MED ORDER — AMLODIPINE BESYLATE 5 MG PO TABS: 5.0000 mg | ORAL_TABLET | Freq: Every day | ORAL | 3 refills | Status: DC

## 2022-03-27 NOTE — Progress Notes (Signed)
Patient referred by [name], MD for chest pain  Subjective:   Lindsay Harvey, female    DOB: March 23, 1936, 86 y.o.   MRN: [id number]   Chief Complaint  Patient presents with   Chest Pain   Hyperlipidemia   New Patient (Initial Visit)     HPI  86 y.o. [place] female with hypertension, referred for chest pain  Patient is here with her daughter, who helps with language interpretation. Patient lives fairly sedentary lifestyle. Lately, her blood pressure has been elevated. She reports left sided chest pain, which does seem to worsen with exercise, improves with rest. She also notices that she gets winded easily.   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   Headache    Hypercholesterolemia    Macular degeneration    Wet OU   Wears glasses      Past Surgical History:  Procedure Laterality Date   25 GAUGE PARS PLANA VITRECTOMY WITH 20 GAUGE MVR PORT FOR MACULAR HOLE Right 04/07/2019   Procedure: 25 GAUGE PARS PLANA VITRECTOMY WITH 20 GAUGE MVR PORT FOR MACULAR HOLE;  Surgeon: [name], MD;  Location: [place];  Service: Ophthalmology;  Laterality: Right;   CATARACT EXTRACTION Bilateral    2013   DILATION AND CURETTAGE OF UTERUS     EYE SURGERY Bilateral 2013   Cat Sx   GAS/FLUID EXCHANGE Right 04/07/2019   Procedure: Gas/Fluid Exchange;  Surgeon: [name], MD;  Location: [place];  Service: Ophthalmology;  Laterality: Right;   MEMBRANE PEEL Right 04/07/2019   Procedure: [name];  Surgeon: [name], MD;  Location: [place];  Service: Ophthalmology;  Laterality: Right;   PHOTOCOAGULATION WITH LASER Right 04/07/2019   Procedure: Photocoagulation With Laser;  Surgeon: [name], MD;  Location: [place];  Service: Ophthalmology;  Laterality: Right;   YAG LASER APPLICATION Bilateral      Social History   Tobacco Use  Smoking Status Never  Smokeless Tobacco Never    Social History    Substance and Sexual Activity  Alcohol Use No     Family History  Problem Relation Age of Onset   Stroke Father       Current Outpatient Medications:    calcium carbonate (OSCAL) 1500 (600 Ca) MG TABS tablet, Take 600 mg of elemental calcium by mouth 2 (two) times a day., Disp: , Rfl:    carboxymethylcellulose (REFRESH PLUS) 0.5 % SOLN, Place 1 drop into both eyes 3 (three) times daily as needed (dry/irritated eyes). , Disp: , Rfl:    Cholecalciferol (VITAMIN D3) 50 MCG (2000 UT) TABS, Take 2,000 Units by mouth daily., Disp: , Rfl:    famotidine (PEPCID) 20 MG tablet, Take 20 mg by mouth daily., Disp: , Rfl:    methylPREDNISolone (MEDROL DOSEPAK) 4 MG TBPK tablet, Use as directed, Disp: 21 tablet, Rfl: 0   Multiple Vitamins-Minerals (EMERGEN-C IMMUNE PO), Take 1 packet by mouth daily as needed (for immune support)., Disp: , Rfl:    Multiple Vitamins-Minerals (PRESERVISION AREDS 2 PO), Take 1 tablet by mouth 2 (two) times a day. , Disp: , Rfl:    omeprazole (PRILOSEC) 40 MG capsule, 1 cap(s), Disp: , Rfl:    prednisoLONE acetate (PRED FORTE) 1 % ophthalmic suspension, Place 1 drop into the right eye 4 (four) times daily., Disp: 15 mL, Rfl: 0   REFRESH OPTIVE MEGA-3 0.5-1-0.5 % SOLN, Place  1 drop into both eyes 3 (three) times daily as needed (discomfort/eye irritation.)., Disp: , Rfl:    simvastatin (ZOCOR) 40 MG tablet, Take 10 mg by mouth every evening. 1/4 tablet at night, Disp: , Rfl:   Current Facility-Administered Medications:    Bevacizumab (AVASTIN) SOLN 1.25 mg, 1.25 mg, Intravitreal, , [name], MD, 1.25 mg at 09/15/17 6333   Bevacizumab (AVASTIN) SOLN 1.25 mg, 1.25 mg, Intravitreal, , [name], MD, 1.25 mg at 10/14/17 5456   Bevacizumab (AVASTIN) SOLN 1.25 mg, 1.25 mg, Intravitreal, , [name], MD, 1.25 mg at 11/11/17 0900   Bevacizumab (AVASTIN) SOLN 1.25 mg, 1.25 mg, Intravitreal, , [name], MD, 1.25 mg at 12/11/17 0846   Bevacizumab (AVASTIN) SOLN  1.25 mg, 1.25 mg, Intravitreal, , [name], MD, 1.25 mg at 01/11/18 0932   Bevacizumab (AVASTIN) SOLN 1.25 mg, 1.25 mg, Intravitreal, , [name], MD, 1.25 mg at 02/09/18 0846   Bevacizumab (AVASTIN) SOLN 1.25 mg, 1.25 mg, Intravitreal, , [name], MD, 1.25 mg at 03/17/18 0856   Bevacizumab (AVASTIN) SOLN 1.25 mg, 1.25 mg, Intravitreal, , [name], MD, 1.25 mg at 04/29/18 2563   Bevacizumab (AVASTIN) SOLN 1.25 mg, 1.25 mg, Intravitreal, , [name], MD, 1.25 mg at 06/11/18 0857   Bevacizumab (AVASTIN) SOLN 1.25 mg, 1.25 mg, Intravitreal, , [name], MD, 1.25 mg at 07/26/18 2244   Bevacizumab (AVASTIN) SOLN 1.25 mg, 1.25 mg, Intravitreal, , [name], MD, 1.25 mg at 08/30/18 0850   Bevacizumab (AVASTIN) SOLN 1.25 mg, 1.25 mg, Intravitreal, , [name], MD, 1.25 mg at 10/11/18 1301   Bevacizumab (AVASTIN) SOLN 1.25 mg, 1.25 mg, Intravitreal, , [name], MD, 1.25 mg at 11/22/18 8937   Cardiovascular and other pertinent studies:  Reviewed external labs and tests, independently interpreted  EKG 03/18/2023: Sinus rhythm 65 bpm Left axis deviation  Recent labs: 03/18/2022 : Glucose 90, BUN/Cr 12/0.66. EGFR 87. Na/K 142/4.9. Rest of the CMP normal H/H 11/36. MCV 97. Platelets 251 Chol 186, TG 90, HDL 76, LDL 92   Review of Systems  Cardiovascular:  Positive for chest pain and dyspnea on exertion. Negative for leg swelling, palpitations and syncope.       Vitals:   03/27/22 0942  BP: (!) 146/77  Pulse: 82  Resp: 16  Temp: 98.7 F (37.1 C)  SpO2: 98%     Body mass index is 20.49 kg/m. Filed Weights   03/27/22 0942  Weight: 112 lb (50.8 kg)     Objective:   Physical Exam Vitals and nursing note reviewed.  Constitutional:      General: She is not in acute distress. Neck:     Vascular: No JVD.  Cardiovascular:     Rate and Rhythm: Normal rate and regular rhythm.     Heart sounds: Normal heart sounds. No murmur heard. Pulmonary:      Effort: Pulmonary effort is normal.     Breath sounds: Normal breath sounds. No wheezing or rales.  Musculoskeletal:     Right lower leg: No edema.     Left lower leg: No edema.         Visit diagnoses:   ICD-10-CM   1. Dyspnea on exertion  R06.09 PCV ECHOCARDIOGRAM COMPLETE    PCV MYOCARDIAL PERFUSION WO LEXISCAN    2. Precordial pain  R07.2 aspirin EC 81 MG tablet    PCV ECHOCARDIOGRAM COMPLETE    PCV MYOCARDIAL PERFUSION WO LEXISCAN    CT CARDIAC SCORING ([facility] LOCATIONS ONLY)  nitroGLYCERIN (NITROSTAT) 0.4 MG SL tablet    3. Essential hypertension  I10 amLODipine (NORVASC) 5 MG tablet    PCV ECHOCARDIOGRAM COMPLETE       Orders Placed This Encounter  Procedures   CT CARDIAC SCORING ([facility] LOCATIONS ONLY)   PCV MYOCARDIAL PERFUSION WO LEXISCAN   PCV ECHOCARDIOGRAM COMPLETE     Meds ordered this encounter  Medications   aspirin EC 81 MG tablet    Sig: Take 1 tablet (81 mg total) by mouth daily. Swallow whole.    Dispense:  90 tablet    Refill:  3   amLODipine (NORVASC) 5 MG tablet    Sig: Take 1 tablet (5 mg total) by mouth daily.    Dispense:  30 tablet    Refill:  3   nitroGLYCERIN (NITROSTAT) 0.4 MG SL tablet    Sig: Place 1 tablet (0.4 mg total) under the tongue every 5 (five) minutes as needed for chest pain.    Dispense:  90 tablet    Refill:  3     Assessment & Recommendations:   86 y.o. [place] female with hypertension, referred for chest pain  Chest pain, dyspnea: There is some exertional component to the symptoms. Recommend echocardiogram, exercise nuclear stress test, CT cardiac scoring. Recommend Aspirin 81 mg daily for now. Continue simvastatin.  Hypertension: Added amlodipine 5 mg daily. Discussed diet and lifestyle modification  Thank you for referring the patient to [place]. Please feel free to contact with any questions.   Nigel Mormon, MD Pager: [phone] Office: [phone]

## 2022-04-01 ENCOUNTER — Encounter (INDEPENDENT_AMBULATORY_CARE_PROVIDER_SITE_OTHER): Payer: Medicare Other | Admitting: Ophthalmology

## 2022-04-01 ENCOUNTER — Ambulatory Visit: Payer: Medicare Other

## 2022-04-01 ENCOUNTER — Other Ambulatory Visit: Payer: Self-pay

## 2022-04-01 DIAGNOSIS — R0609 Other forms of dyspnea: Secondary | ICD-10-CM

## 2022-04-01 DIAGNOSIS — I10 Essential (primary) hypertension: Secondary | ICD-10-CM

## 2022-04-01 DIAGNOSIS — R072 Precordial pain: Secondary | ICD-10-CM

## 2022-04-01 MED ORDER — AMLODIPINE BESYLATE 5 MG PO TABS: 5.0000 mg | ORAL_TABLET | Freq: Every day | ORAL | 3 refills | Status: DC

## 2022-04-02 ENCOUNTER — Ambulatory Visit
Admission: RE | Admit: 2022-04-02 | Discharge: 2022-04-02 | Disposition: A | Discharge status: Home / Self Care | Payer: Medicare HMO | Source: Ambulatory Visit | Attending: Cardiology | Admitting: Cardiology

## 2022-04-02 DIAGNOSIS — R072 Precordial pain: Secondary | ICD-10-CM

## 2022-04-03 ENCOUNTER — Telehealth: Payer: Self-pay

## 2022-04-03 NOTE — Telephone Encounter (Signed)
Patient daughter called stating that patient's medication (Amlodipine)needs to go to [facility] pharmacy if it a 90 day supply. Refill has been done.

## 2022-04-08 ENCOUNTER — Telehealth: Payer: Self-pay | Admitting: Cardiology

## 2022-04-09 ENCOUNTER — Other Ambulatory Visit: Payer: Self-pay

## 2022-04-09 DIAGNOSIS — I10 Essential (primary) hypertension: Secondary | ICD-10-CM

## 2022-04-09 MED ORDER — AMLODIPINE BESYLATE 5 MG PO TABS: 5.0000 mg | ORAL_TABLET | Freq: Every day | ORAL | 3 refills | Status: DC

## 2022-04-16 ENCOUNTER — Ambulatory Visit: Payer: Medicare Other

## 2022-04-16 DIAGNOSIS — R0609 Other forms of dyspnea: Secondary | ICD-10-CM

## 2022-04-16 DIAGNOSIS — R072 Precordial pain: Secondary | ICD-10-CM

## 2022-04-16 NOTE — Progress Notes (Deleted)
[facility] - Clinic Note  04/23/2022     CHIEF COMPLAINT Patient presents for No chief complaint on file.    HISTORY OF PRESENT ILLNESS: Lindsay Harvey is a 86 y.o. female who presents to the clinic today for:     Referring physician: Ralene Ok, MD [address] [facility] [address] [place],  [place] [address]  HISTORICAL INFORMATION:   Selected notes from the MEDICAL RECORD NUMBER Referred by Dr. Marcha Solders for concern of AMD OU, CNV   CURRENT MEDICATIONS: Current Outpatient Medications (Ophthalmic Drugs)  Medication Sig   carboxymethylcellulose (REFRESH PLUS) 0.5 % SOLN Place 1 drop into both eyes 3 (three) times daily as needed (dry/irritated eyes).    prednisoLONE acetate (PRED FORTE) 1 % ophthalmic suspension Place 1 drop into the right eye 4 (four) times daily.   REFRESH OPTIVE MEGA-3 0.5-1-0.5 % SOLN Place 1 drop into both eyes 3 (three) times daily as needed (discomfort/eye irritation.).   No current facility-administered medications for this visit. (Ophthalmic Drugs)   Current Outpatient Medications (Other)  Medication Sig   amLODipine (NORVASC) 5 MG tablet Take 1 tablet (5 mg total) by mouth daily.   aspirin EC 81 MG tablet Take 1 tablet (81 mg total) by mouth daily. Swallow whole.   b complex vitamins capsule Take 1 capsule by mouth daily.   calcium carbonate (OSCAL) 1500 (600 Ca) MG TABS tablet Take 600 mg of elemental calcium by mouth 2 (two) times a day.   Cholecalciferol (VITAMIN D3) 50 MCG (2000 UT) TABS Take 2,000 Units by mouth daily.   Multiple Vitamins-Minerals (ICAPS AREDS 2 PO) Take by mouth 2 (two) times daily.   nitroGLYCERIN (NITROSTAT) 0.4 MG SL tablet Place 1 tablet (0.4 mg total) under the tongue every 5 (five) minutes as needed for chest pain.   simvastatin (ZOCOR) 40 MG tablet Take 10 mg by mouth every evening. 1/4 tablet at night   Current Facility-Administered Medications (Other)  Medication Route   Bevacizumab (AVASTIN) SOLN 1.25 mg  Intravitreal   Bevacizumab (AVASTIN) SOLN 1.25 mg Intravitreal   Bevacizumab (AVASTIN) SOLN 1.25 mg Intravitreal   Bevacizumab (AVASTIN) SOLN 1.25 mg Intravitreal   Bevacizumab (AVASTIN) SOLN 1.25 mg Intravitreal   Bevacizumab (AVASTIN) SOLN 1.25 mg Intravitreal   Bevacizumab (AVASTIN) SOLN 1.25 mg Intravitreal   Bevacizumab (AVASTIN) SOLN 1.25 mg Intravitreal   Bevacizumab (AVASTIN) SOLN 1.25 mg Intravitreal   Bevacizumab (AVASTIN) SOLN 1.25 mg Intravitreal   Bevacizumab (AVASTIN) SOLN 1.25 mg Intravitreal   Bevacizumab (AVASTIN) SOLN 1.25 mg Intravitreal   Bevacizumab (AVASTIN) SOLN 1.25 mg Intravitreal   REVIEW OF SYSTEMS:   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   Headache    Hypercholesterolemia    Macular degeneration    Wet OU   Wears glasses    Past Surgical History:  Procedure Laterality Date   25 GAUGE PARS PLANA VITRECTOMY WITH 20 GAUGE MVR PORT FOR MACULAR HOLE Right 04/07/2019   Procedure: 25 GAUGE PARS PLANA VITRECTOMY WITH 20 GAUGE MVR PORT FOR MACULAR HOLE;  Surgeon: Rennis Chris, MD;  Location: [facility] OR;  Service: Ophthalmology;  Laterality: Right;   CATARACT EXTRACTION Bilateral    2013   DILATION AND CURETTAGE OF UTERUS     EYE SURGERY Bilateral 2013   Cat Sx   GAS/FLUID EXCHANGE Right 04/07/2019   Procedure: Gas/Fluid  Exchange;  Surgeon: Rennis Chris, MD;  Location: [facility] OR;  Service: Ophthalmology;  Laterality: Right;   MEMBRANE PEEL Right 04/07/2019   Procedure: Eula Flax;  Surgeon: Rennis Chris, MD;  Location: [facility] OR;  Service: Ophthalmology;  Laterality: Right;   PHOTOCOAGULATION WITH LASER Right 04/07/2019   Procedure: Photocoagulation With Laser;  Surgeon: Rennis Chris, MD;  Location: [facility] OR;  Service: Ophthalmology;  Laterality: Right;   YAG LASER APPLICATION Bilateral    FAMILY HISTORY Family History  Problem  Relation Age of Onset   Stroke Father    SOCIAL HISTORY Social History   Tobacco Use   Smoking status: Never   Smokeless tobacco: Never  Vaping Use   Vaping Use: Never used  Substance Use Topics   Alcohol use: No   Drug use: No       OPHTHALMIC EXAM: Not recorded    IMAGING AND PROCEDURES  Imaging and Procedures for 02/09/18           ASSESSMENT/PLAN:  No diagnosis found.   1. 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  - S/P IVA OD #1 (12.03.18), #2 (01.02.19), #3 (01.30.19), #4 (03.01.19), #5 (04.01.19), #6 (04.30.19), #7 (06.05.19), #8 (07.19.19), #9 (08.30.19), #10 (10.14.19), #11 (11.18.19), #12 (12.30.19), #13 (02.10.20), #14 (04.06.20), #15 (09.17.20), #16 (10.15.20), #17 (12.01.20), #18 (01.18.21), #19 (03.15.21), #20 (5.11.21), #21 (07.20.21), #22 (10.14.21), #23 (01.10.22), #24 (03.07.22), #24 (05.23.22), #25 (9.1.22), #26 (11.01.22), #27 (12.28.22), # 28 (02.14.23), #29 (03.28.23)  - s/p IVE OD #1 (05.09.23) sample  - historically good response to IVA -- has previously been stable on 3 month interval  - FA (04.01.19) shows mild CNVM OD consistent with exudative ARMD  - OCT shows OD: interval improvement in IRF and SRHM temporal macula -- good response to IVE #1  - BCVA 20/80+ from 20/100   - recommend IVE OD #2 today, 06.14.23  - pt wishes to proceed with Eylea sample  - RBA of procedure discussed, questions answered  - see procedure note  - Eylea informed consent form signed and scanned on 05.09.23 (OU)  - f/u April 23, 2022 -- DFE/OCT/possible injection (pt traveling to [place] in July)  2. Age related macular degeneration, exudative, OS  - interval conversion from non-exudative to exudative noted 07.20.21  - s/p IVA OS #1 (07.20.21), #2 (08.19.21), #3 (09.16.21), #4 (10.14.21), #5 (11.29.21), #6 (01.10.22), #7 (03.07.22), #8 (05.23.22), #9 (09.01.22), #10 (3.28.23), #11 (05.09.23), #12 (06.14.23)  - exam  shows no heme  - OCT with stable improvement in IRF inferior macular, persistent VMT w/ cystic changes  - BCVA 20/50 today -- stable  - recommend IVA OS #13 today, 07.12.23  - RBA of procedure discussed, questions answered - informed consent obtained and signed - see procedure note - IVA informed consent obtained, signed and scanned, 07.20.21 - f/u 4 weeks, DFE, OCT  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 Pseudophakia OU  -  s/p CE/IOL OU  - s/p YAG OU  - beautiful surgeries, doing well  - monitor  Ophthalmic Meds Ordered this visit:  No orders of the defined types were placed in this encounter.    No follow-ups on file.  There are no Patient Instructions on file for this visit.  This document serves as a record of services personally performed by Karie Chimera, MD, PhD. It was created on their behalf by De Blanch, an ophthalmic technician. The creation of this record is the provider's dictation and/or activities during the visit.    Electronically signed by: De Blanch, OA, 04/16/22  1:48 PM   Karie Chimera, M.D., Ph.D. Diseases & Surgery of the Retina and Vitreous [facility]   Abbreviations: M myopia (nearsighted); A astigmatism; H hyperopia (farsighted); P presbyopia; Mrx spectacle prescription;  CTL contact lenses; OD right eye; OS left eye; OU both eyes  XT exotropia; ET esotropia; PEK punctate epithelial keratitis; PEE punctate epithelial erosions; DES dry eye syndrome; MGD meibomian gland dysfunction; ATs artificial tears; PFAT's preservative free artificial tears; NSC nuclear sclerotic  cataract; PSC posterior subcapsular cataract; ERM epi-retinal membrane; PVD posterior vitreous detachment; RD retinal detachment; DM diabetes mellitus; DR diabetic retinopathy; NPDR non-proliferative diabetic retinopathy; PDR proliferative diabetic retinopathy; CSME clinically significant macular edema; DME diabetic macular edema; dbh dot blot hemorrhages; CWS cotton wool spot; POAG primary open angle glaucoma; C/D cup-to-disc ratio; HVF humphrey visual field; GVF goldmann visual field; OCT optical coherence tomography; IOP intraocular pressure; BRVO Branch retinal vein occlusion; CRVO central retinal vein occlusion; CRAO central retinal artery occlusion; BRAO branch retinal artery occlusion; RT retinal tear; SB scleral buckle; PPV pars plana vitrectomy; VH Vitreous hemorrhage; PRP panretinal laser photocoagulation; IVK intravitreal kenalog; VMT vitreomacular traction; MH Macular hole;  NVD neovascularization of the disc; NVE neovascularization elsewhere; AREDS age related eye disease study; ARMD age related macular degeneration; POAG primary open angle glaucoma; EBMD epithelial/anterior basement membrane dystrophy; ACIOL anterior chamber intraocular lens; IOL intraocular lens; PCIOL posterior chamber intraocular lens; Phaco/IOL phacoemulsification with intraocular lens placement; PRK photorefractive keratectomy; LASIK laser assisted in situ keratomileusis; HTN hypertension; DM diabetes mellitus; COPD chronic obstructive pulmonary disease

## 2022-04-23 ENCOUNTER — Ambulatory Visit (INDEPENDENT_AMBULATORY_CARE_PROVIDER_SITE_OTHER): Payer: Medicare Other | Admitting: Ophthalmology

## 2022-04-23 ENCOUNTER — Encounter (INDEPENDENT_AMBULATORY_CARE_PROVIDER_SITE_OTHER): Payer: Self-pay | Admitting: Ophthalmology

## 2022-04-23 DIAGNOSIS — H353231 Exudative age-related macular degeneration, bilateral, with active choroidal neovascularization: Secondary | ICD-10-CM

## 2022-04-23 DIAGNOSIS — H43823 Vitreomacular adhesion, bilateral: Secondary | ICD-10-CM | POA: Diagnosis not present

## 2022-04-23 DIAGNOSIS — Z961 Presence of intraocular lens: Secondary | ICD-10-CM | POA: Diagnosis not present

## 2022-04-23 DIAGNOSIS — H353211 Exudative age-related macular degeneration, right eye, with active choroidal neovascularization: Secondary | ICD-10-CM

## 2022-04-23 DIAGNOSIS — H353221 Exudative age-related macular degeneration, left eye, with active choroidal neovascularization: Secondary | ICD-10-CM

## 2022-04-23 NOTE — Progress Notes (Signed)
[facility] - Clinic Note  04/23/2022     CHIEF COMPLAINT Patient presents for Retina Follow Up    HISTORY OF PRESENT ILLNESS: Lindsay Harvey is a 86 y.o. female who presents to the clinic today for:   HPI     Retina Follow Up   Patient presents with  Wet AMD.  In left eye.  Severity is moderate.  Duration of 4 weeks.  Since onset it is stable.  I, the attending physician,  performed the HPI with the patient and updated documentation appropriately.        Comments   Patient states vision the same OS. Sees some floaters OS. Using AT's as needed.      Last edited by Rennis Chris, MD on 04/24/2022 12:01 AM.     Referring physician: Ralene Ok, MD [address] [place],  [place] [address]  HISTORICAL INFORMATION:   Selected notes from the MEDICAL RECORD NUMBER Referred by Dr. Marcha Solders for concern of AMD OU, CNV   CURRENT MEDICATIONS: Current Outpatient Medications (Ophthalmic Drugs)  Medication Sig   carboxymethylcellulose (REFRESH PLUS) 0.5 % SOLN Place 1 drop into both eyes 3 (three) times daily as needed (dry/irritated eyes).    REFRESH OPTIVE MEGA-3 0.5-1-0.5 % SOLN Place 1 drop into both eyes 3 (three) times daily as needed (discomfort/eye irritation.).   prednisoLONE acetate (PRED FORTE) 1 % ophthalmic suspension Place 1 drop into the right eye 4 (four) times daily. (Patient not taking: Reported on 04/23/2022)   No current facility-administered medications for this visit. (Ophthalmic Drugs)   Current Outpatient Medications (Other)  Medication Sig   amLODipine (NORVASC) 5 MG tablet Take 1 tablet (5 mg total) by mouth daily.   aspirin EC 81 MG tablet Take 1 tablet (81 mg total) by mouth daily. Swallow whole.   b complex vitamins capsule Take 1 capsule by mouth daily.   calcium carbonate (OSCAL) 1500 (600 Ca) MG TABS tablet Take 600 mg of elemental calcium by mouth 2 (two) times a day.   Cholecalciferol (VITAMIN D3) 50 MCG (2000 UT) TABS Take  2,000 Units by mouth daily.   Multiple Vitamins-Minerals (ICAPS AREDS 2 PO) Take by mouth 2 (two) times daily.   nitroGLYCERIN (NITROSTAT) 0.4 MG SL tablet Place 1 tablet (0.4 mg total) under the tongue every 5 (five) minutes as needed for chest pain.   simvastatin (ZOCOR) 40 MG tablet Take 10 mg by mouth every evening. 1/4 tablet at night   Current Facility-Administered Medications (Other)  Medication Route   Bevacizumab (AVASTIN) SOLN 1.25 mg Intravitreal   Bevacizumab (AVASTIN) SOLN 1.25 mg Intravitreal   Bevacizumab (AVASTIN) SOLN 1.25 mg Intravitreal   Bevacizumab (AVASTIN) SOLN 1.25 mg Intravitreal   Bevacizumab (AVASTIN) SOLN 1.25 mg Intravitreal   Bevacizumab (AVASTIN) SOLN 1.25 mg Intravitreal   Bevacizumab (AVASTIN) SOLN 1.25 mg Intravitreal   Bevacizumab (AVASTIN) SOLN 1.25 mg Intravitreal   Bevacizumab (AVASTIN) SOLN 1.25 mg Intravitreal   Bevacizumab (AVASTIN) SOLN 1.25 mg Intravitreal   Bevacizumab (AVASTIN) SOLN 1.25 mg Intravitreal   Bevacizumab (AVASTIN) SOLN 1.25 mg Intravitreal   Bevacizumab (AVASTIN) SOLN 1.25 mg Intravitreal   REVIEW OF SYSTEMS: ROS   Positive for: Gastrointestinal, Eyes Negative for: Constitutional, Neurological, Skin, Genitourinary, Musculoskeletal, HENT, Endocrine, Cardiovascular, Respiratory, Psychiatric, Allergic/Imm, Heme/Lymph Last edited by Annalee Genta D, COT on 04/23/2022  8:35 AM.      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   Headache    Hypercholesterolemia    Macular degeneration    Wet OU   Wears glasses    Past Surgical History:  Procedure Laterality Date   25 GAUGE PARS PLANA VITRECTOMY WITH 20 GAUGE MVR PORT FOR MACULAR HOLE Right 04/07/2019   Procedure: 25 GAUGE PARS PLANA VITRECTOMY WITH 20 GAUGE MVR PORT FOR MACULAR HOLE;  Surgeon: Rennis Chris, MD;  Location: [facility] OR;  Service:  Ophthalmology;  Laterality: Right;   CATARACT EXTRACTION Bilateral    2013   DILATION AND CURETTAGE OF UTERUS     EYE SURGERY Bilateral 2013   Cat Sx   GAS/FLUID EXCHANGE Right 04/07/2019   Procedure: Gas/Fluid Exchange;  Surgeon: Rennis Chris, MD;  Location: [facility] OR;  Service: Ophthalmology;  Laterality: Right;   MEMBRANE PEEL Right 04/07/2019   Procedure: Eula Flax;  Surgeon: Rennis Chris, MD;  Location: [facility] OR;  Service: Ophthalmology;  Laterality: Right;   PHOTOCOAGULATION WITH LASER Right 04/07/2019   Procedure: Photocoagulation With Laser;  Surgeon: Rennis Chris, MD;  Location: [facility] OR;  Service: Ophthalmology;  Laterality: Right;   YAG LASER APPLICATION Bilateral    FAMILY HISTORY Family History  Problem Relation Age of Onset   Stroke Father    SOCIAL HISTORY Social History   Tobacco Use   Smoking status: Never   Smokeless tobacco: Never  Vaping Use   Vaping Use: Never used  Substance Use Topics   Alcohol use: No   Drug use: No       OPHTHALMIC EXAM: Base Eye Exam     Visual Acuity (Snellen - Linear)       Right Left   Dist Arma 20/60 20/60   Dist ph  NI 20/50 -2         Tonometry (Tonopen, 8:44 AM)       Right Left   Pressure 13 15         Pupils       Dark Light Shape React APD   Right 3 2 Round Brisk None   Left 3 2 Round Brisk None         Visual Fields (Counting fingers)       Left Right    Full Full         Extraocular Movement       Right Left    Full, Ortho Full, Ortho         Neuro/Psych     Oriented x3: Yes   Mood/Affect: Normal         Dilation     Both eyes: 1.0% Mydriacyl, 2.5% Phenylephrine @ 8:43 AM           Slit Lamp and Fundus Exam     Slit Lamp Exam       Right Left   Lids/Lashes Dermatochalasis - upper lid, periorbital edema Dermatochalasis - upper lid   Conjunctiva/Sclera White and quiet White and quiet   Cornea Arcus, 1+ fine Punctate epithelial erosions, mild endo pigment Arcus, 3+  Punctate epithelial erosions, mild tear film debris, mild endo pigment, dry tear fil, decreased TBUT   Anterior Chamber Deep, quiet Deep and quiet   Iris Round and dilated Round and dilated   Lens PC IOL in good postion with anterior capsular phimosis, 1+ Posterior capsular opacification PC IOL in good postion with anterior capsular phimosis, 1+ Posterior capsular opacification sparing center   Anterior Vitreous post vitrectomy  Vitreous syneresis         Fundus Exam       Right Left   Disc Sharp rim, mild pallor, peripapillary drusen, PPP Pink and Sharp, central pallor, +cupping, +PPP, peripapillary drusen   C/D Ratio 0.5 0.75   Macula flat; ERM/VMT gone, blunted foveal reflex, refractile drusen, trace cystic changes - improved, pigment clumping, focal IRH centrally - fading Blunted foveal reflex, refractile Drusen, early Atrophy, RPE mottling and clumping, +PEDs/CNV, +cystic changes -- stably improved, no heme   Vessels attenuated, Tortuous attenuated, Tortuous   Periphery Attached, scattered peripheral drusen, peripheral laser changes superiorly  Attached with peripheral drusen           IMAGING AND PROCEDURES  Imaging and Procedures for 02/09/18  OCT, Retina - OU - Both Eyes       Right Eye Quality was good. Central Foveal Thickness: 213. Progression has improved. Findings include no IRF, no SRF, abnormal foveal contour, retinal drusen , subretinal hyper-reflective material, intraretinal hyper-reflective material, pigment epithelial detachment, outer retinal atrophy (stable improvement in IRF and SRHM temporal macula ).   Left Eye Quality was good. Central Foveal Thickness: 275. Progression has been stable. Findings include no SRF, abnormal foveal contour, retinal drusen , epiretinal membrane, intraretinal fluid, pigment epithelial detachment, vitreous traction, outer retinal atrophy (Stable improvement in IRF inferior macula, persistent VMT w/ cystic changes).   Notes Images  taken, stored on drive  Diagnosis / Impression:  OD: Exudative ARMD -- Interval improvement in IRF and SRHM temporal macula  OS: exudative ARMD -- Stable improvement in IRF inferior macula, persistent VMT w/ cystic changes  Clinical management:  See below  Abbreviations: NFP - Normal foveal profile. CME - cystoid macular edema. PED - pigment epithelial detachment. IRF - intraretinal fluid. SRF - subretinal fluid. EZ - ellipsoid zone. ERM - epiretinal membrane. ORA - outer retinal atrophy. ORT - outer retinal tubulation. SRHM - subretinal hyper-reflective material       Intravitreal Injection, Pharmacologic Agent - OD - Right Eye       Time Out 04/23/2022. 9:08 AM. Confirmed correct patient, procedure, site, and patient consented.   Anesthesia Topical anesthesia was used. Anesthetic medications included Lidocaine 2%, Proparacaine 0.5%.   Procedure Preparation included 5% betadine to ocular surface, eyelid speculum. A (32g) needle was used.   Injection: 2 mg aflibercept 2 MG/0.05ML   Route: Intravitreal, Site: Right Eye   NDC: L6038910, Lot: [id number], Expiration date: 02/10/2023, Waste: 0 mL   Post-op Post injection exam found visual acuity of at least counting fingers. The patient tolerated the procedure well. There were no complications. The patient received written and verbal post procedure care education. Post injection medications were not given.      Intravitreal Injection, Pharmacologic Agent - OS - Left Eye       Time Out 04/23/2022. 9:09 AM. Confirmed correct patient, procedure, site, and patient consented.   Anesthesia Topical anesthesia was used. Anesthetic medications included Lidocaine 2%, Proparacaine 0.5%.   Procedure Preparation included 5% betadine to ocular surface, eyelid speculum. A (32g) needle was used.   Injection: 1.25 mg Bevacizumab 1.25mg /0.47ml   Route: Intravitreal, Site: Left Eye   NDC: P3213405, Lot: [id number], Expiration date:  06/09/2022   Post-op Post injection exam found visual acuity of at least counting fingers. The patient tolerated the procedure well. There were no complications. The patient received written and verbal post procedure care education. Post injection medications were not given.  ASSESSMENT/PLAN:    ICD-10-CM   1. 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 Exudative age-related macular degeneration of left eye with active choroidal neovascularization (HCC)  H35.3221 Intravitreal Injection, Pharmacologic Agent - OS - Left Eye    Bevacizumab (AVASTIN) SOLN 1.25 mg    3. 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 Pseudophakia of both eyes  Z96.1      1. 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  - S/P IVA OD #1 (12.03.18), #2 (01.02.19), #3 (01.30.19), #4 (03.01.19), #5 (04.01.19), #6 (04.30.19), #7 (06.05.19), #8 (07.19.19), #9 (08.30.19), #10 (10.14.19), #11 (11.18.19), #12 (12.30.19), #13 (02.10.20), #14 (04.06.20), #15 (09.17.20), #16 (10.15.20), #17 (12.01.20), #18 (01.18.21), #19 (03.15.21), #20 (5.11.21), #21 (07.20.21), #22 (10.14.21), #23 (01.10.22), #24 (03.07.22), #24 (05.23.22), #25 (9.1.22), #26 (11.01.22), #27 (12.28.22), # 28 (02.14.23), #29 (03.28.23)  - s/p IVE OD #1 (05.09.23) sample, #2 (06.14.23)  - historically good response to IVA -- has previously been stable on 3 month interval  - FA (04.01.19) shows mild CNVM OD consistent with exudative ARMD  - OCT shows OD: interval improvement in IRF and SRHM temporal macula at 4 wks  - BCVA 20/60 from 20/80   - recommend IVE OD #3 today, 07.12.23 w/ ext to 5-6 wks  - pt wishes to proceed with Eylea  - RBA of procedure discussed, questions answered  - see procedure  note  - Eylea informed consent form signed and scanned on 05.09.23 (OU)  - f/u 5-6 weeks -- DFE/OCT/possible injection  2. Age related macular degeneration, exudative, OS  - interval conversion from non-exudative to exudative noted 07.20.21  - s/p IVA OS #1 (07.20.21), #2 (08.19.21), #3 (09.16.21), #4 (10.14.21), #5 (11.29.21), #6 (01.10.22), #7 (03.07.22), #8 (05.23.22), #9 (09.01.22), #10 (03.28.23), #11 (05.09.23), #12 (06.14.23)  - exam shows no heme  - OCT with OS: stable improvement in IRF inferior macula, persistent VMT w/ cystic changes at 4 wks  - BCVA 20/50 today -- stable  - recommend IVA OS #13 today, 07.12.23 w/ ext to 5-6 wks  - RBA of procedure discussed, questions answered - informed consent obtained and signed - see procedure note - IVA informed consent obtained, signed and scanned, 07.20.21 - f/u 5-6 weeks, DFE, OCT  3. 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 Pseudophakia OU  - s/p CE/IOL OU  - s/p YAG OU  - beautiful surgeries, doing well  - monitor  Ophthalmic Meds Ordered this visit:  Meds ordered this encounter  Medications   aflibercept (EYLEA) SOLN 2 mg   Bevacizumab (AVASTIN) SOLN 1.25 mg     Return for f/u 5-6 weeks, exu ARMD OU, DFE, OCT.  There are no Patient Instructions on file for this visit.  This document serves as a record of services personally performed by Karie ChimeraBrian G. Elwood Bazinet, MD, PhD. It was created on their behalf by De BlanchMakenzie Simpson, an ophthalmic technician. The creation of this record is the provider's dictation and/or activities during the visit.    Electronically signed by: De BlanchMakenzie Simpson, OA,  04/24/22  12:15 AM  This document serves as a record of services personally performed by Karie ChimeraBrian G. Josslin Sanjuan, MD, PhD. It was created on their behalf by Glee ArvinAmanda J. Manson PasseyBrown, OA an ophthalmic technician. The creation of this record is the provider's dictation and/or activities during the visit.    Electronically signed by: Glee ArvinAmanda J. Manson PasseyBrown, [place] 07.12.2023 12:15 AM  Karie ChimeraBrian G. Kourtland Coopman, M.D., Ph.D. Diseases & Surgery of the Retina and Vitreous [facility]  I have reviewed the above documentation for accuracy and completeness, and I agree with the above. Karie ChimeraBrian G. Alton Bouknight, M.D., Ph.D. 04/24/22 12:17 AM   Abbreviations: M myopia (nearsighted); A astigmatism; H hyperopia (farsighted); P presbyopia; Mrx spectacle prescription;  CTL contact lenses; OD right eye; OS left eye; OU both eyes  XT exotropia; ET esotropia; PEK punctate epithelial keratitis; PEE punctate epithelial erosions; DES dry eye syndrome; MGD meibomian gland dysfunction; ATs artificial tears; PFAT's preservative free artificial tears; NSC nuclear sclerotic cataract; PSC posterior subcapsular cataract; ERM epi-retinal membrane; PVD posterior vitreous detachment; RD retinal detachment; DM diabetes mellitus; DR diabetic retinopathy; NPDR non-proliferative diabetic retinopathy; PDR proliferative diabetic retinopathy; CSME clinically significant macular edema; DME diabetic macular edema; dbh dot blot hemorrhages; CWS cotton wool spot; POAG primary open angle glaucoma; C/D cup-to-disc ratio; HVF humphrey visual field; GVF goldmann visual field; OCT optical coherence tomography; IOP intraocular pressure; BRVO Branch retinal vein occlusion; CRVO central retinal vein occlusion; CRAO central retinal artery occlusion; BRAO branch retinal artery occlusion; RT retinal tear; SB scleral buckle; PPV pars plana vitrectomy; VH Vitreous hemorrhage; PRP panretinal laser photocoagulation; IVK intravitreal kenalog; VMT vitreomacular traction; MH Macular  hole;  NVD neovascularization of the disc; NVE neovascularization elsewhere; AREDS age related eye disease study; ARMD age related macular degeneration; POAG primary open angle glaucoma; EBMD epithelial/anterior basement membrane dystrophy; ACIOL anterior chamber intraocular lens; IOL intraocular lens; PCIOL posterior chamber intraocular lens; Phaco/IOL phacoemulsification with intraocular lens placement; PRK photorefractive keratectomy; LASIK laser assisted in situ keratomileusis; HTN hypertension; DM diabetes mellitus; COPD chronic obstructive pulmonary disease

## 2022-04-24 ENCOUNTER — Encounter (INDEPENDENT_AMBULATORY_CARE_PROVIDER_SITE_OTHER): Payer: Self-pay | Admitting: Ophthalmology

## 2022-04-24 MED ORDER — AFLIBERCEPT 2MG/0.05ML IZ SOLN FOR KALEIDOSCOPE
2.0000 mg | INTRAVITREAL | Status: AC | PRN
  Administered 2022-04-24: 2 mg via INTRAVITREAL

## 2022-04-24 MED ORDER — BEVACIZUMAB CHEMO INJECTION 1.25MG/0.05ML SYRINGE FOR KALEIDOSCOPE
1.2500 mg | INTRAVITREAL | Status: AC | PRN
  Administered 2022-04-24: 1.25 mg via INTRAVITREAL

## 2022-04-28 ENCOUNTER — Encounter: Payer: Self-pay | Admitting: Cardiology

## 2022-04-28 ENCOUNTER — Ambulatory Visit: Payer: Medicare Other | Admitting: Cardiology

## 2022-04-28 DIAGNOSIS — R072 Precordial pain: Secondary | ICD-10-CM

## 2022-04-28 DIAGNOSIS — I10 Essential (primary) hypertension: Secondary | ICD-10-CM

## 2022-04-28 NOTE — Progress Notes (Unsigned)
Telephone visit   Patient referred by [name], MD for chest pain  Subjective:   Lindsay Harvey, female    DOB: 1936-01-08, 86 y.o.   MRN: [id number]   Chief Complaint  Patient presents with   Dyspnea on exertion   Results   Follow-up     HPI  86 y.o. [place] female with hypertension, chest pain  Daughter [name] assisted with language interpretation  Reviewed recent test results with the patient, details below. Patient has not had any recurrence of chest pain    Current Outpatient Medications:    amLODipine (NORVASC) 5 MG tablet, Take 1 tablet (5 mg total) by mouth daily., Disp: 90 tablet, Rfl: 3   aspirin EC 81 MG tablet, Take 1 tablet (81 mg total) by mouth daily. Swallow whole., Disp: 90 tablet, Rfl: 3   calcium carbonate (OSCAL) 1500 (600 Ca) MG TABS tablet, Take 600 mg of elemental calcium by mouth 2 (two) times a day., Disp: , Rfl:    carboxymethylcellulose (REFRESH PLUS) 0.5 % SOLN, Place 1 drop into both eyes 3 (three) times daily as needed (dry/irritated eyes). , Disp: , Rfl:    Cholecalciferol (VITAMIN D3) 50 MCG (2000 UT) TABS, Take 2,000 Units by mouth daily., Disp: , Rfl:    nitroGLYCERIN (NITROSTAT) 0.4 MG SL tablet, Place 1 tablet (0.4 mg total) under the tongue every 5 (five) minutes as needed for chest pain., Disp: 90 tablet, Rfl: 3   prednisoLONE acetate (PRED FORTE) 1 % ophthalmic suspension, Place 1 drop into the right eye 4 (four) times daily., Disp: 15 mL, Rfl: 0   REFRESH OPTIVE MEGA-3 0.5-1-0.5 % SOLN, Place 1 drop into both eyes 3 (three) times daily as needed (discomfort/eye irritation.)., Disp: , Rfl:    simvastatin (ZOCOR) 40 MG tablet, Take 20 mg by mouth every evening. 1/4 tablet at night, Disp: , Rfl:   Current Facility-Administered Medications:    Bevacizumab (AVASTIN) SOLN 1.25 mg, 1.25 mg, Intravitreal, , [name], MD, 1.25 mg at 09/15/17 5320   Bevacizumab (AVASTIN) SOLN 1.25 mg, 1.25 mg, Intravitreal, , [name], MD, 1.25 mg at  10/14/17 2334   Bevacizumab (AVASTIN) SOLN 1.25 mg, 1.25 mg, Intravitreal, , [name], MD, 1.25 mg at 11/11/17 0900   Bevacizumab (AVASTIN) SOLN 1.25 mg, 1.25 mg, Intravitreal, , [name], MD, 1.25 mg at 12/11/17 0846   Bevacizumab (AVASTIN) SOLN 1.25 mg, 1.25 mg, Intravitreal, , [name], MD, 1.25 mg at 01/11/18 0932   Bevacizumab (AVASTIN) SOLN 1.25 mg, 1.25 mg, Intravitreal, , [name], MD, 1.25 mg at 02/09/18 0846   Bevacizumab (AVASTIN) SOLN 1.25 mg, 1.25 mg, Intravitreal, , [name], MD, 1.25 mg at 03/17/18 0856   Bevacizumab (AVASTIN) SOLN 1.25 mg, 1.25 mg, Intravitreal, , [name], MD, 1.25 mg at 04/29/18 3568   Bevacizumab (AVASTIN) SOLN 1.25 mg, 1.25 mg, Intravitreal, , [name], MD, 1.25 mg at 06/11/18 0857   Bevacizumab (AVASTIN) SOLN 1.25 mg, 1.25 mg, Intravitreal, , [name], MD, 1.25 mg at 07/26/18 2244   Bevacizumab (AVASTIN) SOLN 1.25 mg, 1.25 mg, Intravitreal, , [name], MD, 1.25 mg at 08/30/18 0850   Bevacizumab (AVASTIN) SOLN 1.25 mg, 1.25 mg, Intravitreal, , [name], MD, 1.25 mg at 10/11/18 1301   Bevacizumab (AVASTIN) SOLN 1.25 mg, 1.25 mg, Intravitreal, , [name], MD, 1.25 mg at 11/22/18 6168   Cardiovascular and other pertinent studies:  Reviewed external labs and tests, independently interpreted  Exercise Myoview stress test 04/16/2022: Exercise nuclear stress  test was performed using Bruce protocol.   1 Day Rest and Stress images. Exercise time 5 minutes 12 seconds, achieved 4.64 METS, 88% APMHR.  Stress ECG negative for ischemia. Normal myocardial perfusion without evidence of reversible myocardial ischemia or prior infarct. Left ventricular size normal, wall thickness preserved, no regional wall motion abnormalities. LVEF calculated at 58% Low risk study. No prior studies for comparison.  CT cardiac scoring 04/02/2022: 1. Coronary calcium score 3.7(LAD) is at the 21st percentile for the patient's age,  sex and race. 2. Evidence of prior granulomatous disease as well as postinflammatory scarring in the lungs, especially in the right lung.   Echocardiogram 04/01/2022:  Left ventricle cavity is normal in size and wall thickness. Normal global  wall motion. Normal LV systolic function with EF 57%. Normal diastolic  filling pattern.  Structurally normal trileaflet aortic valve.  Mild (Grade I) aortic  regurgitation.  Mild tricuspid regurgitation. Estimated pulmonary artery systolic pressure  30 mmHg.   EKG 03/18/2023: Sinus rhythm 65 bpm Left axis deviation  Recent labs: 03/18/2022 : Glucose 90, BUN/Cr 12/0.66. EGFR 87. Na/K 142/4.9. Rest of the CMP normal H/H 11/36. MCV 97. Platelets 251 Chol 186, TG 90, HDL 76, LDL 92   Review of Systems  Cardiovascular:  Positive for chest pain and dyspnea on exertion. Negative for leg swelling, palpitations and syncope.    Vitals not available   Objective:   Physical Exam Not performe,d telephone visit     Visit diagnoses:   ICD-10-CM   1. Precordial pain  R07.2     2. Essential hypertension  I10         Assessment & Recommendations:   86 y.o. [place] female with hypertension, referred for chest pain  Chest pain, dyspnea: No ischemia on stress testing Echocardiogram unremarkable Possible noncardiac chest pain Discontinued Aspirin  Hypertension: Added amlodipine 5 mg daily at last visit. 90 day refill sent. Can be refilled by Dr. Mellody Drown after that.  If higher dose of either amlodipine or simvastatin is needed in future, may need to change to alternate statin to avoid myalgias.   Follow up as needed   Nigel Mormon, MD Pager: [phone] Office: [phone]

## 2022-04-30 ENCOUNTER — Encounter: Payer: Self-pay | Admitting: Cardiology

## 2022-04-30 MED ORDER — AMLODIPINE BESYLATE 5 MG PO TABS: 5.0000 mg | ORAL_TABLET | Freq: Every day | ORAL | 1 refills | Status: DC

## 2022-05-21 NOTE — Progress Notes (Signed)
[facility] Note  06/03/2022     CHIEF COMPLAINT Patient presents for Retina Follow Up    HISTORY OF PRESENT ILLNESS: Lindsay Harvey is a 86 y.o. female who presents to the clinic today for:   HPI     Retina Follow Up   Patient presents with  Wet AMD.  In right eye.  Severity is moderate.  Duration of 6 weeks.  Since onset it is stable.  I, the attending physician,  performed the HPI with the patient and updated documentation appropriately.        Comments   Pt here for 6 wk ret f/u exu ARMD OD. PT states VA the same, no changes noted.       Last edited by [name], MD on 06/03/2022  3:44 PM.    Pt states vision is the same  Referring physician: [name], MD [id number],  [place] [id number]  HISTORICAL INFORMATION:   Selected notes from the MEDICAL RECORD NUMBER Referred by Dr. [name] for concern of AMD OU, CNV   CURRENT MEDICATIONS: Current Outpatient Medications (Ophthalmic Drugs)  Medication Sig   carboxymethylcellulose (REFRESH PLUS) 0.5 % SOLN Place 1 drop into both eyes 3 (three) times daily as needed (dry/irritated eyes).    prednisoLONE acetate (PRED FORTE) 1 % ophthalmic suspension Place 1 drop into the right eye 4 (four) times daily.   REFRESH OPTIVE MEGA-3 0.5-1-0.5 % SOLN Place 1 drop into both eyes 3 (three) times daily as needed (discomfort/eye irritation.).   No current facility-administered medications for this visit. (Ophthalmic Drugs)   Current Outpatient Medications (Other)  Medication Sig   amLODipine (NORVASC) 5 MG tablet Take 1 tablet (5 mg total) by mouth daily.   aspirin EC 81 MG tablet Take 1 tablet (81 mg total) by mouth daily. Swallow whole.   calcium carbonate (OSCAL) 1500 (600 Ca) MG TABS tablet Take 600 mg of elemental calcium by mouth 2 (two) times a day.   Cholecalciferol (VITAMIN D3) 50 MCG (2000 UT) TABS Take 2,000 Units by mouth daily.   nitroGLYCERIN (NITROSTAT) 0.4 MG SL tablet  Place 1 tablet (0.4 mg total) under the tongue every 5 (five) minutes as needed for chest pain.   simvastatin (ZOCOR) 40 MG tablet Take 20 mg by mouth every evening. 1/4 tablet at night   Current Facility-Administered Medications (Other)  Medication Route   Bevacizumab (AVASTIN) SOLN 1.25 mg Intravitreal   Bevacizumab (AVASTIN) SOLN 1.25 mg Intravitreal   Bevacizumab (AVASTIN) SOLN 1.25 mg Intravitreal   Bevacizumab (AVASTIN) SOLN 1.25 mg Intravitreal   Bevacizumab (AVASTIN) SOLN 1.25 mg Intravitreal   Bevacizumab (AVASTIN) SOLN 1.25 mg Intravitreal   Bevacizumab (AVASTIN) SOLN 1.25 mg Intravitreal   Bevacizumab (AVASTIN) SOLN 1.25 mg Intravitreal   Bevacizumab (AVASTIN) SOLN 1.25 mg Intravitreal   Bevacizumab (AVASTIN) SOLN 1.25 mg Intravitreal   Bevacizumab (AVASTIN) SOLN 1.25 mg Intravitreal   Bevacizumab (AVASTIN) SOLN 1.25 mg Intravitreal   Bevacizumab (AVASTIN) SOLN 1.25 mg Intravitreal   REVIEW OF SYSTEMS: ROS   Positive for: Gastrointestinal, Eyes Negative for: Constitutional, Neurological, Skin, Genitourinary, Musculoskeletal, HENT, Endocrine, Cardiovascular, Respiratory, Psychiatric, Allergic/Imm, Heme/Lymph Last edited by Kingsley Spittle, COT on 06/03/2022  8:32 AM.       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   Headache  Hypercholesterolemia    Macular degeneration    Wet OU   Wears glasses    Past Surgical History:  Procedure Laterality Date   25 GAUGE PARS PLANA VITRECTOMY WITH 20 GAUGE MVR PORT FOR MACULAR HOLE Right 04/07/2019   Procedure: 25 GAUGE PARS PLANA VITRECTOMY WITH 20 GAUGE MVR PORT FOR MACULAR HOLE;  Surgeon: [name], MD;  Location: [facility];  Service: Ophthalmology;  Laterality: Right;   CATARACT EXTRACTION Bilateral    2013   DILATION AND CURETTAGE OF UTERUS     EYE SURGERY Bilateral 2013   Cat Sx    GAS/FLUID EXCHANGE Right 04/07/2019   Procedure: Gas/Fluid Exchange;  Surgeon: [name], MD;  Location: [facility];  Service: Ophthalmology;  Laterality: Right;   MEMBRANE PEEL Right 04/07/2019   Procedure: [name];  Surgeon: [name], MD;  Location: [facility];  Service: Ophthalmology;  Laterality: Right;   PHOTOCOAGULATION WITH LASER Right 04/07/2019   Procedure: Photocoagulation With Laser;  Surgeon: [name], MD;  Location: [facility];  Service: Ophthalmology;  Laterality: Right;   YAG LASER APPLICATION Bilateral    FAMILY HISTORY Family History  Problem Relation Age of Onset   Stroke Father    SOCIAL HISTORY Social History   Tobacco Use   Smoking status: Never   Smokeless tobacco: Never  Vaping Use   Vaping Use: Never used  Substance Use Topics   Alcohol use: No   Drug use: No       OPHTHALMIC EXAM: Base Eye Exam     Visual Acuity (Snellen - Linear)       Right Left   Dist [place] 20/80 +1 20/60 +2   Dist ph [place] 20/60 -2 20/50 -1         Tonometry (Tonopen, 8:39 AM)       Right Left   Pressure 12 11         Pupils       Dark Light Shape React APD   Right 3 2 Round Brisk None   Left 3 2 Round Brisk None         Visual Fields (Counting fingers)       Left Right    Full Full         Extraocular Movement       Right Left    Full, Ortho Full, Ortho         Neuro/Psych     Oriented x3: Yes   Mood/Affect: Normal         Dilation     Both eyes: 1.0% Mydriacyl, 2.5% Phenylephrine @ 8:39 AM           Slit Lamp and Fundus Exam     Slit Lamp Exam       Right Left   Lids/Lashes Dermatochalasis - upper lid, periorbital edema Dermatochalasis - upper lid   Conjunctiva/Sclera White and quiet White and quiet   Cornea Arcus, 1+ fine Punctate epithelial erosions, mild endo pigment Arcus, 3+ Punctate epithelial erosions, mild tear film debris, mild endo pigment, dry tear fil, decreased TBUT   Anterior Chamber Deep, quiet Deep and quiet    Iris Round and dilated Round and dilated   Lens PC IOL in good postion with anterior capsular phimosis, 1+ Posterior capsular opacification PC IOL in good postion with anterior capsular phimosis, 1+ Posterior capsular opacification sparing center   Anterior Vitreous post vitrectomy Vitreous syneresis         Fundus Exam  Right Left   Disc Sharp rim, mild pallor, peripapillary drusen, PPP Pink and Sharp, central pallor, +cupping, +PPP, peripapillary drusen   C/D Ratio 0.5 0.75   Macula flat; ERM/VMT gone, blunted foveal reflex, refractile drusen, trace cystic changes - improved, pigment clumping, focal IRH centrally - fading, but persistent Blunted foveal reflex, refractile Drusen, early Atrophy, RPE mottling and clumping, +PEDs/CNV, +cystic changes -- stably improved, no heme   Vessels attenuated, Tortuous attenuated, Tortuous   Periphery Attached, scattered peripheral drusen, peripheral laser changes superiorly  Attached with peripheral drusen           IMAGING AND PROCEDURES  Imaging and Procedures for 02/09/18  OCT, Retina - OU - Both Eyes       Right Eye Quality was good. Central Foveal Thickness: 223. Progression has been stable. Findings include no IRF, no SRF, abnormal foveal contour, retinal drusen , subretinal hyper-reflective material, intraretinal hyper-reflective material, pigment epithelial detachment, outer retinal atrophy (stable improvement in IRF and SRHM temporal macula ).   Left Eye Quality was good. Central Foveal Thickness: 277. Progression has been stable. Findings include no SRF, abnormal foveal contour, retinal drusen , epiretinal membrane, intraretinal fluid, pigment epithelial detachment, vitreous traction, outer retinal atrophy (Stable improvement in IRF inferior macula, persistent VMT w/ cystic changes).   Notes Images taken, stored on drive  Diagnosis / Impression:  OD: Exudative ARMD -- stable improvement in IRF and SRHM temporal macula  OS:  exudative ARMD -- Stable improvement in IRF inferior macula, persistent VMT w/ cystic changes  Clinical management:  See below  Abbreviations: NFP - Normal foveal profile. CME - cystoid macular edema. PED - pigment epithelial detachment. IRF - intraretinal fluid. SRF - subretinal fluid. EZ - ellipsoid zone. ERM - epiretinal membrane. ORA - outer retinal atrophy. ORT - outer retinal tubulation. SRHM - subretinal hyper-reflective material       Intravitreal Injection, Pharmacologic Agent - OD - Right Eye       Time Out 06/03/2022. 9:04 AM. Confirmed correct patient, procedure, site, and patient consented.   Anesthesia Topical anesthesia was used. Anesthetic medications included Lidocaine 2%, Proparacaine 0.5%.   Procedure Preparation included 5% betadine to ocular surface, eyelid speculum. A (32g) needle was used.   Injection: 2 mg aflibercept 2 MG/0.05ML   Route: Intravitreal, Site: Right Eye   NDC: A3590391, Lot: [id number], Expiration date: 02/10/2023, Waste: 0 mL   Post-op Post injection exam found visual acuity of at least counting fingers. The patient tolerated the procedure well. There were no complications. The patient received written and verbal post procedure care education. Post injection medications were not given.      Intravitreal Injection, Pharmacologic Agent - OS - Left Eye       Time Out 06/03/2022. 9:04 AM. Confirmed correct patient, procedure, site, and patient consented.   Anesthesia Topical anesthesia was used. Anesthetic medications included Lidocaine 2%, Proparacaine 0.5%.   Procedure Preparation included 5% betadine to ocular surface, eyelid speculum. A (32g) needle was used.   Injection: 1.25 mg Bevacizumab 1.32m/0.05ml   Route: Intravitreal, Site: Left Eye   NDC:: 79390-300-92 Lot:: [id number] Expiration date: 08/01/2022   Post-op Post injection exam found visual acuity of at least counting fingers. The patient tolerated the procedure well.  There were no complications. The patient received written and verbal post procedure care education. Post injection medications were not given.            ASSESSMENT/PLAN:    ICD-10-CM   1. Exudative age-related macular degeneration  of right eye with active choroidal neovascularization (HCC)  H35.3211 OCT, Retina - OU - Both Eyes    Intravitreal Injection, Pharmacologic Agent - OD - Right Eye    aflibercept (EYLEA) SOLN 2 mg    2. Exudative age-related macular degeneration of left eye with active choroidal neovascularization (HCC)  H35.3221 Intravitreal Injection, Pharmacologic Agent - OS - Left Eye    Bevacizumab (AVASTIN) SOLN 1.25 mg    3. 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 Pseudophakia of both eyes  Z96.1      1. 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  - S/P IVA OD #1 (12.03.18), #2 (01.02.19), #3 (01.30.19), #4 (03.01.19), #5 (04.01.19), #6 (04.30.19), #7 (06.05.19), #8 (07.19.19), #9 (08.30.19), #10 (10.14.19), #11 (11.18.19), #12 (12.30.19), #13 (02.10.20), #14 (04.06.20), #15 (09.17.20), #16 (10.15.20), #17 (12.01.20), #18 (01.18.21), #19 (03.15.21), #20 (5.11.21), #21 (07.20.21), #22 (10.14.21), #23 (01.10.22), #24 (03.07.22), #24 (05.23.22), #25 (9.1.22), #26 (11.01.22), #27 (12.28.22), # 28 (02.14.23), #29 (03.28.23)  - s/p IVE OD #1 (05.09.23) sample, #2 (06.14.23), #3 (07.12.23)  - historically good response to IVA -- has previously been stable on 3 month interval  - FA (04.01.19) shows mild CNVM OD consistent with exudative ARMD  - OCT shows OD: stable improvement in IRF and SRHM temporal macula at 5+ wks  - BCVA stable at 20/60   - recommend IVE OD #3 today, 07.12.23 w/ ext to 6 wks  - pt wishes to proceed with Eylea  - RBA of procedure discussed, questions answered  - see procedure note  - Eylea informed consent form signed and scanned on 05.09.23 (OU)  - f/u 6 weeks -- DFE/OCT/possible injection  2.  Age related macular degeneration, exudative, OS  - interval conversion from non-exudative to exudative noted 07.20.21  - s/p IVA OS #1 (07.20.21), #2 (08.19.21), #3 (09.16.21), #4 (10.14.21), #5 (11.29.21), #6 (01.10.22), #7 (03.07.22), #8 (05.23.22), #9 (09.01.22), #10 (03.28.23), #11 (05.09.23), #12 (06.14.23), #13 (07.12.23)  - exam shows no heme  - OCT with OS: stable improvement in IRF inferior macula, persistent VMT w/ cystic changes at 5+ wks  - BCVA 20/50 today -- stable  - recommend IVA OS #13 today, 07.12.23 w/ ext to 6 wks  - RBA of procedure discussed, questions answered - informed consent obtained and signed - see procedure note - IVA informed consent obtained, signed and scanned, 07.20.21 - f/u 6 weeks, DFE, OCT  3. 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 Pseudophakia OU  - s/p CE/IOL OU  - s/p YAG OU  - beautiful surgeries, doing well  - monitor  Ophthalmic Meds Ordered this visit:  Meds ordered this encounter  Medications   Bevacizumab (AVASTIN) SOLN 1.25 mg   aflibercept (EYLEA) SOLN 2 mg     Return in about 6 weeks (around 07/15/2022) for f/u 6 weeks, exu ARMD OU, DFE, OCT.  There are  no Patient Instructions on file for this visit.  This document serves as a record of services personally performed by Gardiner Sleeper, MD, PhD. It was created on their behalf by Renaldo Reel, [place] an ophthalmic technician. The creation of this record is the provider's dictation and/or activities during the visit.    Electronically signed by:  Renaldo Reel, COT  05/21/22 3:44 PM   This document serves as a record of services personally  performed by Gardiner Sleeper, MD, PhD. It was created on their behalf by San Jetty. Owens Shark, OA an ophthalmic technician. The creation of this record is the provider's dictation and/or activities during the visit.    Electronically signed by: San Jetty. Owens Shark, [place] 08.22.2023 3:44 PM   Gardiner Sleeper, M.D., Ph.D. Diseases & Surgery of the Retina and Vitreous [facility]  I have reviewed the above documentation for accuracy and completeness, and I agree with the above. Gardiner Sleeper, M.D., Ph.D. 06/03/22 3:46 PM    Abbreviations: M myopia (nearsighted); A astigmatism; H hyperopia (farsighted); P presbyopia; Mrx spectacle prescription;  CTL contact lenses; OD right eye; OS left eye; OU both eyes  XT exotropia; ET esotropia; PEK punctate epithelial keratitis; PEE punctate epithelial erosions; DES dry eye syndrome; MGD meibomian gland dysfunction; ATs artificial tears; PFAT's preservative free artificial tears; [place] nuclear sclerotic cataract; PSC posterior subcapsular cataract; ERM epi-retinal membrane; PVD posterior vitreous detachment; RD retinal detachment; DM diabetes mellitus; DR diabetic retinopathy; NPDR non-proliferative diabetic retinopathy; PDR proliferative diabetic retinopathy; CSME clinically significant macular edema; DME diabetic macular edema; dbh dot blot hemorrhages; CWS cotton wool spot; POAG primary open angle glaucoma; C/D cup-to-disc ratio; HVF humphrey visual field; GVF goldmann visual field; OCT optical coherence tomography; IOP intraocular pressure; BRVO Branch retinal vein occlusion; CRVO central retinal vein occlusion; CRAO central retinal artery occlusion; BRAO branch retinal artery occlusion; RT retinal tear; SB scleral buckle; PPV pars plana vitrectomy; VH Vitreous hemorrhage; PRP panretinal laser photocoagulation; IVK intravitreal kenalog; VMT vitreomacular traction; MH Macular hole;  NVD neovascularization of the disc; NVE neovascularization elsewhere;  AREDS age related eye disease study; ARMD age related macular degeneration; POAG primary open angle glaucoma; EBMD epithelial/anterior basement membrane dystrophy; ACIOL anterior chamber intraocular lens; IOL intraocular lens; PCIOL posterior chamber intraocular lens; Phaco/IOL phacoemulsification with intraocular lens placement; [place] photorefractive keratectomy; LASIK laser assisted in situ keratomileusis; HTN hypertension; DM diabetes mellitus; COPD chronic obstructive pulmonary disease

## 2022-06-03 ENCOUNTER — Encounter (INDEPENDENT_AMBULATORY_CARE_PROVIDER_SITE_OTHER): Payer: Self-pay | Admitting: Ophthalmology

## 2022-06-03 ENCOUNTER — Ambulatory Visit (INDEPENDENT_AMBULATORY_CARE_PROVIDER_SITE_OTHER): Payer: Medicare Other | Admitting: Ophthalmology

## 2022-06-03 DIAGNOSIS — H353231 Exudative age-related macular degeneration, bilateral, with active choroidal neovascularization: Secondary | ICD-10-CM

## 2022-06-03 DIAGNOSIS — Z961 Presence of intraocular lens: Secondary | ICD-10-CM

## 2022-06-03 DIAGNOSIS — H43823 Vitreomacular adhesion, bilateral: Secondary | ICD-10-CM

## 2022-06-03 DIAGNOSIS — H353211 Exudative age-related macular degeneration, right eye, with active choroidal neovascularization: Secondary | ICD-10-CM

## 2022-06-03 DIAGNOSIS — H353221 Exudative age-related macular degeneration, left eye, with active choroidal neovascularization: Secondary | ICD-10-CM

## 2022-06-03 MED ORDER — AFLIBERCEPT 2MG/0.05ML IZ SOLN FOR KALEIDOSCOPE
2.0000 mg | INTRAVITREAL | Status: AC | PRN
  Administered 2022-06-03: 2 mg via INTRAVITREAL

## 2022-06-03 MED ORDER — BEVACIZUMAB CHEMO INJECTION 1.25MG/0.05ML SYRINGE FOR KALEIDOSCOPE
1.2500 mg | INTRAVITREAL | Status: AC | PRN
  Administered 2022-06-03: 1.25 mg via INTRAVITREAL

## 2022-07-03 NOTE — Progress Notes (Signed)
[facility] - Clinic Note  07/15/2022     CHIEF COMPLAINT Patient presents for Retina Follow Up    HISTORY OF PRESENT ILLNESS: Lindsay Harvey is a 86 y.o. female who presents to the clinic today for:   HPI     Retina Follow Up   Patient presents with  Wet AMD.  In right eye.  This started months ago.  Severity is moderate.  Duration of 6 weeks.  Since onset it is stable.  I, the attending physician,  performed the HPI with the patient and updated documentation appropriately.        Comments   Patient feels that the vision is the same.       Last edited by Rennis ChrisZamora, Briyanna Billingham, MD on 07/15/2022 12:07 PM.    Pt states vision is stable, she is using OTC AT's for dryness  Referring physician: Ralene OkMoreira, Roy, MD [address] [facility] [address] [place] [id number]  HISTORICAL INFORMATION:   Selected notes from the MEDICAL RECORD NUMBER Referred by Dr. Marcha Solders. [name] for concern of AMD OU, CNV   CURRENT MEDICATIONS: Current Outpatient Medications (Ophthalmic Drugs)  Medication Sig   carboxymethylcellulose (REFRESH PLUS) 0.5 % SOLN Place 1 drop into both eyes 3 (three) times daily as needed (dry/irritated eyes).    prednisoLONE acetate (PRED FORTE) 1 % ophthalmic suspension Place 1 drop into the right eye 4 (four) times daily.   REFRESH OPTIVE MEGA-3 0.5-1-0.5 % SOLN Place 1 drop into both eyes 3 (three) times daily as needed (discomfort/eye irritation.).   No current facility-administered medications for this visit. (Ophthalmic Drugs)   Current Outpatient Medications (Other)  Medication Sig   amLODipine (NORVASC) 5 MG tablet Take 1 tablet (5 mg total) by mouth daily.   aspirin EC 81 MG tablet Take 1 tablet (81 mg total) by mouth daily. Swallow whole.   calcium carbonate (OSCAL) 1500 (600 Ca) MG TABS tablet Take 600 mg of elemental calcium by mouth 2 (two) times a day.   Cholecalciferol (VITAMIN D3) 50 MCG (2000 UT) TABS Take 2,000 Units by mouth daily.   nitroGLYCERIN  (NITROSTAT) 0.4 MG SL tablet Place 1 tablet (0.4 mg total) under the tongue every 5 (five) minutes as needed for chest pain.   simvastatin (ZOCOR) 40 MG tablet Take 20 mg by mouth every evening. 1/4 tablet at night   Current Facility-Administered Medications (Other)  Medication Route   Bevacizumab (AVASTIN) SOLN 1.25 mg Intravitreal   Bevacizumab (AVASTIN) SOLN 1.25 mg Intravitreal   Bevacizumab (AVASTIN) SOLN 1.25 mg Intravitreal   Bevacizumab (AVASTIN) SOLN 1.25 mg Intravitreal   Bevacizumab (AVASTIN) SOLN 1.25 mg Intravitreal   Bevacizumab (AVASTIN) SOLN 1.25 mg Intravitreal   Bevacizumab (AVASTIN) SOLN 1.25 mg Intravitreal   Bevacizumab (AVASTIN) SOLN 1.25 mg Intravitreal   Bevacizumab (AVASTIN) SOLN 1.25 mg Intravitreal   Bevacizumab (AVASTIN) SOLN 1.25 mg Intravitreal   Bevacizumab (AVASTIN) SOLN 1.25 mg Intravitreal   Bevacizumab (AVASTIN) SOLN 1.25 mg Intravitreal   Bevacizumab (AVASTIN) SOLN 1.25 mg Intravitreal   REVIEW OF SYSTEMS: ROS   Positive for: Gastrointestinal, Eyes Negative for: Constitutional, Neurological, Skin, Genitourinary, Musculoskeletal, HENT, Endocrine, Cardiovascular, Respiratory, Psychiatric, Allergic/Imm, Heme/Lymph Last edited by Julieanne CottonShamlian, Christine N, COT on 07/15/2022  8:28 AM.     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   Headache    Hypercholesterolemia  Macular degeneration    Wet OU   Wears glasses    Past Surgical History:  Procedure Laterality Date   25 GAUGE PARS PLANA VITRECTOMY WITH 20 GAUGE MVR PORT FOR MACULAR HOLE Right 04/07/2019   Procedure: 25 GAUGE PARS PLANA VITRECTOMY WITH 20 GAUGE MVR PORT FOR MACULAR HOLE;  Surgeon: Rennis Chris, MD;  Location: [facility] OR;  Service: Ophthalmology;  Laterality: Right;   CATARACT EXTRACTION Bilateral    2013   DILATION AND CURETTAGE OF UTERUS     EYE SURGERY  Bilateral 2013   Cat Sx   GAS/FLUID EXCHANGE Right 04/07/2019   Procedure: Gas/Fluid Exchange;  Surgeon: Rennis Chris, MD;  Location: [facility] OR;  Service: Ophthalmology;  Laterality: Right;   MEMBRANE PEEL Right 04/07/2019   Procedure: Eula Flax;  Surgeon: Rennis Chris, MD;  Location: [facility] OR;  Service: Ophthalmology;  Laterality: Right;   PHOTOCOAGULATION WITH LASER Right 04/07/2019   Procedure: Photocoagulation With Laser;  Surgeon: Rennis Chris, MD;  Location: [facility] OR;  Service: Ophthalmology;  Laterality: Right;   YAG LASER APPLICATION Bilateral    FAMILY HISTORY Family History  Problem Relation Age of Onset   Stroke Father    SOCIAL HISTORY Social History   Tobacco Use   Smoking status: Never   Smokeless tobacco: Never  Vaping Use   Vaping Use: Never used  Substance Use Topics   Alcohol use: No   Drug use: No       OPHTHALMIC EXAM: Base Eye Exam     Visual Acuity (Snellen - Linear)       Right Left   Dist [place] 20/80 20/60   Dist ph [place] 20/60 +2 20/50 +1         Tonometry (Tonopen, 8:33 AM)       Right Left   Pressure 9 14         Pupils       Dark Light Shape React APD   Right 3 2 Round Brisk None   Left 3 2 Round Brisk None         Visual Fields       Left Right    Full Full         Extraocular Movement       Right Left    Full, Ortho Full, Ortho         Neuro/Psych     Oriented x3: Yes   Mood/Affect: Normal         Dilation     Both eyes: 1.0% Mydriacyl, 2.5% Phenylephrine @ 8:29 AM           Slit Lamp and Fundus Exam     Slit Lamp Exam       Right Left   Lids/Lashes Dermatochalasis - upper lid, periorbital edema Dermatochalasis - upper lid   Conjunctiva/Sclera White and quiet White and quiet   Cornea Arcus, 1+ fine Punctate epithelial erosions, mild endo pigment Arcus, 3+ Punctate epithelial erosions, mild tear film debris, mild endo pigment, dry tear fil, decreased TBUT   Anterior Chamber Deep, quiet Deep and quiet    Iris Round and dilated Round and dilated   Lens PC IOL in good postion with anterior capsular phimosis, 1+ Posterior capsular opacification PC IOL in good postion with anterior capsular phimosis, 1+ Posterior capsular opacification sparing center   Anterior Vitreous post vitrectomy Vitreous syneresis         Fundus Exam       Right Left   Disc Lambert Mody  rim, mild pallor, peripapillary drusen, PPP Pink and Sharp, central pallor, +cupping, +PPP, peripapillary drusen   C/D Ratio 0.5 0.75   Macula flat; ERM/VMT gone, blunted foveal reflex, refractile drusen, trace cystic changes - improved, pigment clumping, focal IRH centrally - fading Blunted foveal reflex, refractile Drusen, early Atrophy, RPE mottling and clumping, +PEDs/CNV, +cystic changes -- stably improved, no heme   Vessels attenuated, mild tortuosity attenuated, Tortuous   Periphery Attached, scattered peripheral drusen, peripheral laser changes superiorly Attached with peripheral drusen           IMAGING AND PROCEDURES  Imaging and Procedures for 02/09/18  OCT, Retina - OU - Both Eyes       Right Eye Quality was good. Central Foveal Thickness: 225. Progression has been stable. Findings include no IRF, no SRF, abnormal foveal contour, retinal drusen , subretinal hyper-reflective material, intraretinal hyper-reflective material, pigment epithelial detachment, outer retinal atrophy (stable improvement in IRF and SRHM temporal macula ).   Left Eye Quality was good. Central Foveal Thickness: 281. Progression has been stable. Findings include no SRF, abnormal foveal contour, retinal drusen , subretinal hyper-reflective material, epiretinal membrane, intraretinal fluid, pigment epithelial detachment, vitreous traction, outer retinal atrophy (Stable improvement in IRF inferior macula, persistent VMT w/ cystic changes).   Notes Images taken, stored on drive  Diagnosis / Impression:  OD: Exudative ARMD -- stable improvement in IRF  and SRHM temporal macula  OS: exudative ARMD -- Stable improvement in IRF inferior macula, persistent VMT w/ cystic changes  Clinical management:  See below  Abbreviations: NFP - Normal foveal profile. CME - cystoid macular edema. PED - pigment epithelial detachment. IRF - intraretinal fluid. SRF - subretinal fluid. EZ - ellipsoid zone. ERM - epiretinal membrane. ORA - outer retinal atrophy. ORT - outer retinal tubulation. SRHM - subretinal hyper-reflective material       Intravitreal Injection, Pharmacologic Agent - OD - Right Eye       Time Out 07/15/2022. 8:48 AM. Confirmed correct patient, procedure, site, and patient consented.   Anesthesia Topical anesthesia was used. Anesthetic medications included Lidocaine 2%, Proparacaine 0.5%.   Procedure Preparation included 5% betadine to ocular surface, eyelid speculum. A (32g) needle was used.   Injection: 2 mg aflibercept 2 MG/0.05ML   Route: Intravitreal, Site: Right Eye   NDC: L6038910, Lot: [id number], Expiration date: 08/13/2023, Waste: 0 mL   Post-op Post injection exam found visual acuity of at least counting fingers. The patient tolerated the procedure well. There were no complications. The patient received written and verbal post procedure care education. Post injection medications were not given.      Intravitreal Injection, Pharmacologic Agent - OS - Left Eye       Time Out 07/15/2022. 8:48 AM. Confirmed correct patient, procedure, site, and patient consented.   Anesthesia Topical anesthesia was used. Anesthetic medications included Lidocaine 2%, Proparacaine 0.5%.   Procedure Preparation included 5% betadine to ocular surface, eyelid speculum. A (32g) needle was used.   Injection: 1.25 mg Bevacizumab 1.25mg /0.39ml   Route: Intravitreal, Site: Left Eye   NDC: P3213405, Lot: [id number], Expiration date: 08/05/2022   Post-op Post injection exam found visual acuity of at least counting fingers. The patient  tolerated the procedure well. There were no complications. The patient received written and verbal post procedure care education. Post injection medications were not given.            ASSESSMENT/PLAN:    ICD-10-CM   1. Exudative age-related macular degeneration of right eye with active  choroidal neovascularization (HCC)  H35.3211 OCT, Retina - OU - Both Eyes    Intravitreal Injection, Pharmacologic Agent - OD - Right Eye    aflibercept (EYLEA) SOLN 2 mg    2. Exudative age-related macular degeneration of left eye with active choroidal neovascularization (HCC)  H35.3221 OCT, Retina - OU - Both Eyes    Intravitreal Injection, Pharmacologic Agent - OS - Left Eye    Bevacizumab (AVASTIN) SOLN 1.25 mg    3. 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 Pseudophakia of both eyes  Z96.1      1. 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  - S/P IVA OD #1 (12.03.18), #2 (01.02.19), #3 (01.30.19), #4 (03.01.19), #5 (04.01.19), #6 (04.30.19), #7 (06.05.19), #8 (07.19.19), #9 (08.30.19), #10 (10.14.19), #11 (11.18.19), #12 (12.30.19), #13 (02.10.20), #14 (04.06.20), #15 (09.17.20), #16 (10.15.20), #17 (12.01.20), #18 (01.18.21), #19 (03.15.21), #20 (5.11.21), #21 (07.20.21), #22 (10.14.21), #23 (01.10.22), #24 (03.07.22), #24 (05.23.22), #25 (9.1.22), #26 (11.01.22), #27 (12.28.22), # 28 (02.14.23), #29 (03.28.23)  - s/p IVE OD #1 (05.09.23) sample, #2 (06.14.23), #3 (07.12.23), #4 (08.22.23)  - historically good response to IVA -- has previously been stable on 3 month interval  - FA (04.01.19) shows mild CNVM OD consistent with exudative ARMD  - OCT shows OD: stable improvement in IRF and SRHM temporal macula at 6 wks  - BCVA stable at 20/60   - recommend IVE OD #5 today, 10.03.23 w/ ext to 7 wks  - pt wishes to proceed with Eylea  - RBA of procedure discussed, questions answered  - see procedure note  - Eylea informed consent form signed and  scanned on 05.09.23 (OU)  - f/u 6 weeks -- DFE/OCT/possible injection  2. Age related macular degeneration, exudative, OS  - interval conversion from non-exudative to exudative noted 07.20.21  - s/p IVA OS #1 (07.20.21), #2 (08.19.21), #3 (09.16.21), #4 (10.14.21), #5 (11.29.21), #6 (01.10.22), #7 (03.07.22), #8 (05.23.22), #9 (09.01.22), #10 (03.28.23), #11 (05.09.23), #12 (06.14.23), #13 (07.12.23), #14 (08.22.23)  - exam shows no heme  - OCT with OS: stable improvement in IRF inferior macula, persistent VMT w/ cystic changes at 6 wks  - BCVA 20/50 today -- stable  - recommend IVA OS #15 today, 10.03.23 w/ ext to 7 wks  - RBA of procedure discussed, questions answered - informed consent obtained and signed - see procedure note - IVA informed consent obtained, signed and scanned, 07.20.21 - f/u 7 weeks, DFE, OCT  3. 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 Pseudophakia OU  - s/p CE/IOL OU  - s/p YAG OU  - beautiful surgeries, doing well  - continue to monitor  Ophthalmic Meds Ordered this visit:  Meds ordered this encounter  Medications   Bevacizumab (AVASTIN) SOLN 1.25 mg   aflibercept (EYLEA) SOLN 2 mg     Return in about 7 weeks (around 09/02/2022) for  f/u exu ARMD OU, DFE, OCT.  There are no Patient Instructions on file for this visit.  This document serves as a record of services personally performed by Gardiner Sleeper, MD, PhD. It was created on their behalf by Renaldo Reel, [place] an ophthalmic technician. The creation of this record is the provider's dictation and/or activities during the visit.    Electronically signed by:   Renaldo Reel, COT  09.21.23 12:13 PM   This document serves as a record of services personally performed by Gardiner Sleeper, MD, PhD. It was created on their behalf by San Jetty. Owens Shark, OA an ophthalmic technician. The creation of this record is the provider's dictation and/or activities during the visit.    Electronically signed by: San Jetty. Owens Shark, [place] 10.03.2023 12:13 PM  Gardiner Sleeper, M.D., Ph.D. Diseases & Surgery of the Retina and Vitreous [facility]  I have reviewed the above documentation for accuracy and completeness, and I agree with the above. Gardiner Sleeper, M.D., Ph.D. 07/15/22 12:13 PM  Abbreviations: M myopia (nearsighted); A astigmatism; H hyperopia (farsighted); P presbyopia; Mrx spectacle prescription;  CTL contact lenses; OD right eye; OS left eye; OU both eyes  XT exotropia; ET esotropia; PEK punctate epithelial keratitis; PEE punctate epithelial erosions; DES dry eye syndrome; MGD meibomian gland dysfunction; ATs artificial tears; PFAT's preservative free artificial tears; [place] nuclear sclerotic cataract; PSC posterior subcapsular cataract; ERM epi-retinal membrane; PVD posterior vitreous detachment; RD retinal detachment; DM diabetes mellitus; DR diabetic retinopathy; NPDR non-proliferative diabetic retinopathy; PDR proliferative diabetic retinopathy; CSME clinically significant macular edema; DME diabetic macular edema; dbh dot blot hemorrhages; CWS cotton wool spot; POAG primary open angle glaucoma; C/D cup-to-disc ratio; HVF humphrey visual field; GVF goldmann visual field; OCT optical coherence tomography; IOP intraocular pressure; BRVO Branch retinal vein occlusion; CRVO central retinal vein occlusion; CRAO central retinal artery occlusion; BRAO branch retinal artery occlusion; RT retinal tear; SB scleral buckle; PPV pars plana vitrectomy; VH Vitreous hemorrhage; PRP panretinal laser photocoagulation; IVK intravitreal kenalog; VMT vitreomacular  traction; MH Macular hole;  NVD neovascularization of the disc; NVE neovascularization elsewhere; AREDS age related eye disease study; ARMD age related macular degeneration; POAG primary open angle glaucoma; EBMD epithelial/anterior basement membrane dystrophy; ACIOL anterior chamber intraocular lens; IOL intraocular lens; PCIOL posterior chamber intraocular lens; Phaco/IOL phacoemulsification with intraocular lens placement; [place] photorefractive keratectomy; LASIK laser assisted in situ keratomileusis; HTN hypertension; DM diabetes mellitus; COPD chronic obstructive pulmonary disease

## 2022-07-15 ENCOUNTER — Encounter (INDEPENDENT_AMBULATORY_CARE_PROVIDER_SITE_OTHER): Payer: Self-pay | Admitting: Ophthalmology

## 2022-07-15 ENCOUNTER — Ambulatory Visit (INDEPENDENT_AMBULATORY_CARE_PROVIDER_SITE_OTHER): Payer: Medicare Other | Admitting: Ophthalmology

## 2022-07-15 DIAGNOSIS — H353211 Exudative age-related macular degeneration, right eye, with active choroidal neovascularization: Secondary | ICD-10-CM

## 2022-07-15 DIAGNOSIS — H353231 Exudative age-related macular degeneration, bilateral, with active choroidal neovascularization: Secondary | ICD-10-CM

## 2022-07-15 DIAGNOSIS — Z961 Presence of intraocular lens: Secondary | ICD-10-CM

## 2022-07-15 DIAGNOSIS — H43823 Vitreomacular adhesion, bilateral: Secondary | ICD-10-CM

## 2022-07-15 DIAGNOSIS — H353221 Exudative age-related macular degeneration, left eye, with active choroidal neovascularization: Secondary | ICD-10-CM

## 2022-07-15 MED ORDER — BEVACIZUMAB CHEMO INJECTION 1.25MG/0.05ML SYRINGE FOR KALEIDOSCOPE
1.2500 mg | INTRAVITREAL | Status: AC | PRN
  Administered 2022-07-15: 1.25 mg via INTRAVITREAL

## 2022-07-15 MED ORDER — AFLIBERCEPT 2MG/0.05ML IZ SOLN FOR KALEIDOSCOPE
2.0000 mg | INTRAVITREAL | Status: AC | PRN
  Administered 2022-07-15: 2 mg via INTRAVITREAL

## 2022-08-19 NOTE — Progress Notes (Signed)
[facility] - Clinic Note  09/02/2022     CHIEF COMPLAINT Patient presents for Retina Follow Up    HISTORY OF PRESENT ILLNESS: Lindsay Harvey is a 86 y.o. 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  Severity is moderate.  Duration of 7 weeks.  Since onset it is stable.  I, the attending physician,  performed the HPI with the patient and updated documentation appropriately.        Comments   7 week Retina eval wet armd ou. Patient states vision is stable      Last edited by Rennis Chris, MD on 09/02/2022  8:43 AM.     Pt states vision is stable  Referring physician: Ralene Ok, MD [address] [place],  [place] [address]  HISTORICAL INFORMATION:   Selected notes from the MEDICAL RECORD NUMBER Referred by Dr. Marcha Solders for concern of AMD OU, CNV   CURRENT MEDICATIONS: Current Outpatient Medications (Ophthalmic Drugs)  Medication Sig   carboxymethylcellulose (REFRESH PLUS) 0.5 % SOLN Place 1 drop into both eyes 3 (three) times daily as needed (dry/irritated eyes).    prednisoLONE acetate (PRED FORTE) 1 % ophthalmic suspension Place 1 drop into the right eye 4 (four) times daily.   REFRESH OPTIVE MEGA-3 0.5-1-0.5 % SOLN Place 1 drop into both eyes 3 (three) times daily as needed (discomfort/eye irritation.).   No current facility-administered medications for this visit. (Ophthalmic Drugs)   Current Outpatient Medications (Other)  Medication Sig   amLODipine (NORVASC) 5 MG tablet Take 1 tablet (5 mg total) by mouth daily.   aspirin EC 81 MG tablet Take 1 tablet (81 mg total) by mouth daily. Swallow whole.   Cholecalciferol (VITAMIN D3) 50 MCG (2000 UT) TABS Take 2,000 Units by mouth daily.   simvastatin (ZOCOR) 40 MG tablet Take 20 mg by mouth every evening. 1/4 tablet at night   calcium carbonate (OSCAL) 1500 (600 Ca) MG TABS tablet Take 600 mg of elemental calcium by  mouth 2 (two) times a day.   nitroGLYCERIN (NITROSTAT) 0.4 MG SL tablet Place 1 tablet (0.4 mg total) under the tongue every 5 (five) minutes as needed for chest pain.   Current Facility-Administered Medications (Other)  Medication Route   Bevacizumab (AVASTIN) SOLN 1.25 mg Intravitreal   Bevacizumab (AVASTIN) SOLN 1.25 mg Intravitreal   Bevacizumab (AVASTIN) SOLN 1.25 mg Intravitreal   Bevacizumab (AVASTIN) SOLN 1.25 mg Intravitreal   Bevacizumab (AVASTIN) SOLN 1.25 mg Intravitreal   Bevacizumab (AVASTIN) SOLN 1.25 mg Intravitreal   Bevacizumab (AVASTIN) SOLN 1.25 mg Intravitreal   Bevacizumab (AVASTIN) SOLN 1.25 mg Intravitreal   Bevacizumab (AVASTIN) SOLN 1.25 mg Intravitreal   Bevacizumab (AVASTIN) SOLN 1.25 mg Intravitreal   Bevacizumab (AVASTIN) SOLN 1.25 mg Intravitreal   Bevacizumab (AVASTIN) SOLN 1.25 mg Intravitreal   Bevacizumab (AVASTIN) SOLN 1.25 mg Intravitreal   REVIEW OF SYSTEMS: ROS   Positive for: Gastrointestinal, Eyes Negative for: Constitutional, Neurological, Skin, Genitourinary, Musculoskeletal, HENT, Endocrine, Cardiovascular, Respiratory, Psychiatric, Allergic/Imm, Heme/Lymph Last edited by Lana Fish, COT on 09/02/2022  8:28 AM.     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   Headache    Hypercholesterolemia    Macular degeneration  Wet OU   Wears glasses    Past Surgical History:  Procedure Laterality Date   25 GAUGE PARS PLANA VITRECTOMY WITH 20 GAUGE MVR PORT FOR MACULAR HOLE Right 04/07/2019   Procedure: 25 GAUGE PARS PLANA VITRECTOMY WITH 20 GAUGE MVR PORT FOR MACULAR HOLE;  Surgeon: [name], MD;  Location: [place];  Service: Ophthalmology;  Laterality: Right;   CATARACT EXTRACTION Bilateral    2013   DILATION AND CURETTAGE OF UTERUS     EYE SURGERY Bilateral 2013   Cat Sx   GAS/FLUID EXCHANGE Right  04/07/2019   Procedure: Gas/Fluid Exchange;  Surgeon: [name], MD;  Location: [place];  Service: Ophthalmology;  Laterality: Right;   MEMBRANE PEEL Right 04/07/2019   Procedure: [name];  Surgeon: [name], MD;  Location: [place];  Service: Ophthalmology;  Laterality: Right;   PHOTOCOAGULATION WITH LASER Right 04/07/2019   Procedure: Photocoagulation With Laser;  Surgeon: [name], MD;  Location: [place];  Service: Ophthalmology;  Laterality: Right;   YAG LASER APPLICATION Bilateral    FAMILY HISTORY Family History  Problem Relation Age of Onset   Stroke Father    SOCIAL HISTORY Social History   Tobacco Use   Smoking status: Never   Smokeless tobacco: Never  Vaping Use   Vaping Use: Never used  Substance Use Topics   Alcohol use: No   Drug use: No       OPHTHALMIC EXAM: Base Eye Exam     Visual Acuity (Snellen - Linear)       Right Left   Dist [place] 20/80-1 20/60-2   Dist ph [place] 20/NI 20/NI         Tonometry (Tonopen, 8:30 AM)       Right Left   Pressure 9 12         Pupils       Dark Light Shape React APD   Right 3 2 Round Brisk None   Left 3 2 Round Brisk None         Visual Fields (Counting fingers)       Left Right    Full Full         Extraocular Movement       Right Left    Full, Ortho Full, Ortho         Neuro/Psych     Oriented x3: Yes   Mood/Affect: Normal         Dilation     Both eyes: 1.0% Mydriacyl, 2.5% Phenylephrine @ 8:30 AM           Slit Lamp and Fundus Exam     Slit Lamp Exam       Right Left   Lids/Lashes Dermatochalasis - upper lid, periorbital edema Dermatochalasis - upper lid   Conjunctiva/Sclera White and quiet White and quiet   Cornea Arcus, 1+ fine Punctate epithelial erosions, mild endo pigment Arcus, 3+ Punctate epithelial erosions, mild tear film debris, mild endo pigment, dry tear fil, decreased TBUT   Anterior Chamber Deep, quiet Deep and quiet   Iris Round and dilated Round and  dilated   Lens PC IOL in good postion with anterior capsular phimosis, 1+ Posterior capsular opacification PC IOL in good postion with anterior capsular phimosis, 1+ Posterior capsular opacification sparing center   Anterior Vitreous post vitrectomy Vitreous syneresis         Fundus Exam       Right Left   Disc Sharp rim, mild pallor, peripapillary drusen,  PPP Pink and Sharp, central pallor, +cupping, +PPP, peripapillary drusen   C/D Ratio 0.5 0.75   Macula flat; ERM/VMT gone, blunted foveal reflex, refractile drusen, trace cystic changes - stably improved, pigment clumping, focal IRH centrally - fading, +atrophy Blunted foveal reflex, refractile Drusen, early Atrophy, RPE mottling and clumping, +PEDs/CNV, +cystic changes -- stably improved, no heme   Vessels attenuated, mild tortuosity attenuated, Tortuous   Periphery Attached, scattered peripheral drusen, peripheral laser changes superiorly Attached with peripheral drusen           IMAGING AND PROCEDURES  Imaging and Procedures for 02/09/18  OCT, Retina - OU - Both Eyes       Right Eye Quality was good. Central Foveal Thickness: 241. Progression has been stable. Findings include no IRF, no SRF, abnormal foveal contour, retinal drusen , subretinal hyper-reflective material, intraretinal hyper-reflective material, pigment epithelial detachment, outer retinal atrophy (stable improvement in IRF and SRHM temporal macula ).   Left Eye Quality was good. Central Foveal Thickness: 264. Progression has been stable. Findings include no SRF, abnormal foveal contour, retinal drusen , subretinal hyper-reflective material, epiretinal membrane, intraretinal fluid, pigment epithelial detachment, vitreous traction, outer retinal atrophy (Stable improvement in IRF inferior macula, persistent VMT w/ cystic changes).   Notes Images taken, stored on drive  Diagnosis / Impression:  OD: Exudative ARMD -- stable improvement in IRF and SRHM temporal  macula  OS: exudative ARMD -- Stable improvement in IRF inferior macula, persistent VMT w/ cystic changes  Clinical management:  See below  Abbreviations: NFP - Normal foveal profile. CME - cystoid macular edema. PED - pigment epithelial detachment. IRF - intraretinal fluid. SRF - subretinal fluid. EZ - ellipsoid zone. ERM - epiretinal membrane. ORA - outer retinal atrophy. ORT - outer retinal tubulation. SRHM - subretinal hyper-reflective material       Intravitreal Injection, Pharmacologic Agent - OD - Right Eye       Time Out 09/02/2022. 8:42 AM. Confirmed correct patient, procedure, site, and patient consented.   Anesthesia Topical anesthesia was used. Anesthetic medications included Lidocaine 2%, Proparacaine 0.5%.   Procedure Preparation included 5% betadine to ocular surface, eyelid speculum. A (32g) needle was used.   Injection: 2 mg aflibercept 2 MG/0.05ML   Route: Intravitreal, Site: Right Eye   NDC: A3590391, Lot: [id number], Expiration date: 12/11/2023, Waste: 0 mL   Post-op Post injection exam found visual acuity of at least counting fingers. The patient tolerated the procedure well. There were no complications. The patient received written and verbal post procedure care education. Post injection medications were not given.      Intravitreal Injection, Pharmacologic Agent - OS - Left Eye       Time Out 09/02/2022. 8:42 AM. Confirmed correct patient, procedure, site, and patient consented.   Anesthesia Topical anesthesia was used. Anesthetic medications included Lidocaine 2%, Proparacaine 0.5%.   Procedure Preparation included 5% betadine to ocular surface, eyelid speculum. A (32g) needle was used.   Injection: 1.25 mg Bevacizumab 1.25mg /0.63ml   Route: Intravitreal, Site: Left Eye   NDC: H061816, [id number], Expiration date: 09/28/2022   Post-op Post injection exam found visual acuity of at least counting fingers. The patient tolerated the  procedure well. There were no complications. The patient received written and verbal post procedure care education. Post injection medications were not given.            ASSESSMENT/PLAN:    ICD-10-CM   1. Exudative age-related macular degeneration of right eye with active choroidal neovascularization ([place])  H35.3211 OCT, Retina - OU - Both Eyes    Intravitreal Injection, Pharmacologic Agent - OD - Right Eye    aflibercept (EYLEA) SOLN 2 mg    2. Exudative age-related macular degeneration of left eye with active choroidal neovascularization (HCC)  H35.3221 OCT, Retina - OU - Both Eyes    Intravitreal Injection, Pharmacologic Agent - OS - Left Eye    Bevacizumab (AVASTIN) SOLN 1.25 mg    3. 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 Pseudophakia of both eyes  Z96.1       1. 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  - S/P IVA OD #1 (12.03.18), #2 (01.02.19), #3 (01.30.19), #4 (03.01.19), #5 (04.01.19), #6 (04.30.19), #7 (06.05.19), #8 (07.19.19), #9 (08.30.19), #10 (10.14.19), #11 (11.18.19), #12 (12.30.19), #13 (02.10.20), #14 (04.06.20), #15 (09.17.20), #16 (10.15.20), #17 (12.01.20), #18 (01.18.21), #19 (03.15.21), #20 (5.11.21), #21 (07.20.21), #22 (10.14.21), #23 (01.10.22), #24 (03.07.22), #24 (05.23.22), #25 (9.1.22), #26 (11.01.22), #27 (12.28.22), # 28 (02.14.23), #29 (03.28.23) -- IVA resistance  - s/p IVE OD #1 (05.09.23) sample, #2 (06.14.23), #3 (07.12.23), #4 (08.22.23), #5 (10.03.23)  - historically good response to IVA -- has previously been stable on 3 month interval  - FA (04.01.19) shows mild CNVM OD consistent with exudative ARMD  - OCT shows OD: stable improvement in IRF and SRHM temporal macula at 7 wks  - BCVA decreased to 20/80 from 20/60   - recommend IVE OD #6 today, 11.21.23 w/ f/u in 7 wks  - pt wishes to proceed with Eylea  - RBA of procedure discussed, questions answered  - see procedure note  - Eylea  informed consent form signed and scanned on 05.09.23 (OU)  - f/u 7 weeks -- DFE/OCT/possible injection  2. Age related macular degeneration, exudative, OS  - interval conversion from non-exudative to exudative noted 07.20.21  - s/p IVA OS #1 (07.20.21), #2 (08.19.21), #3 (09.16.21), #4 (10.14.21), #5 (11.29.21), #6 (01.10.22), #7 (03.07.22), #8 (05.23.22), #9 (09.01.22), #10 (03.28.23), #11 (05.09.23), #12 (06.14.23), #13 (07.12.23), #14 (08.22.23), #15 (10.03.23)  - exam shows no heme  - OCT with OS: stable improvement in IRF inferior macula, persistent VMT w/ cystic changes at 7 wks  - BCVA decreased to 20/60 from 20/50 today   - recommend IVA OS #16 today, 11.21.23 w/ f/u in 7 wks  - RBA of procedure discussed, questions answered - informed consent obtained and signed - see procedure note - IVA informed consent obtained, signed and scanned, 07.20.21 - f/u 7 weeks, DFE, OCT  3. 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 Pseudophakia OU  - s/p CE/IOL OU  - s/p YAG OU  - beautiful surgeries, doing well  - continue to monitor  Ophthalmic Meds Ordered this visit:  Meds ordered this encounter  Medications   Bevacizumab (AVASTIN) SOLN 1.25 mg   aflibercept (EYLEA) SOLN 2 mg  Return in about 7 weeks (around 10/21/2022) for f/u exu ARMD OU, DFE, OCT.  There are no Patient Instructions on file for this visit.  This document serves as a record of services personally performed by Gardiner Sleeper, MD, PhD. It was created on their behalf by Renaldo Reel, [place] an ophthalmic technician. The creation of this record is the provider's dictation and/or  activities during the visit.    Electronically signed by:  Renaldo Reel, COT  11.07.23 1:13 PM   This document serves as a record of services personally performed by Gardiner Sleeper, MD, PhD. It was created on their behalf by San Jetty. Owens Shark, OA an ophthalmic technician. The creation of this record is the provider's dictation and/or activities during the visit.    Electronically signed by: San Jetty. Owens Shark, [place] 11.21.2023 1:13 PM  Gardiner Sleeper, M.D., Ph.D. Diseases & Surgery of the Retina and Vitreous [facility]  I have reviewed the above documentation for accuracy and completeness, and I agree with the above. Gardiner Sleeper, M.D., Ph.D. 09/02/22 1:13 PM  Abbreviations: M myopia (nearsighted); A astigmatism; H hyperopia (farsighted); P presbyopia; Mrx spectacle prescription;  CTL contact lenses; OD right eye; OS left eye; OU both eyes  XT exotropia; ET esotropia; PEK punctate epithelial keratitis; PEE punctate epithelial erosions; DES dry eye syndrome; MGD meibomian gland dysfunction; ATs artificial tears; PFAT's preservative free artificial tears; [place] nuclear sclerotic cataract; PSC posterior subcapsular cataract; ERM epi-retinal membrane; PVD posterior vitreous detachment; RD retinal detachment; DM diabetes mellitus; DR diabetic retinopathy; NPDR non-proliferative diabetic retinopathy; PDR proliferative diabetic retinopathy; CSME clinically significant macular edema; DME diabetic macular edema; dbh dot blot hemorrhages; CWS cotton wool spot; POAG primary open angle glaucoma; C/D cup-to-disc ratio; HVF humphrey visual field; GVF goldmann visual field; OCT optical coherence tomography; IOP intraocular pressure; BRVO Branch retinal vein occlusion; CRVO central retinal vein occlusion; CRAO central retinal artery occlusion; BRAO branch retinal artery occlusion; RT retinal tear; SB scleral buckle; PPV pars plana vitrectomy; VH Vitreous hemorrhage; PRP panretinal laser  photocoagulation; IVK intravitreal kenalog; VMT vitreomacular traction; MH Macular hole;  NVD neovascularization of the disc; NVE neovascularization elsewhere; AREDS age related eye disease study; ARMD age related macular degeneration; POAG primary open angle glaucoma; EBMD epithelial/anterior basement membrane dystrophy; ACIOL anterior chamber intraocular lens; IOL intraocular lens; PCIOL posterior chamber intraocular lens; Phaco/IOL phacoemulsification with intraocular lens placement; [place] photorefractive keratectomy; LASIK laser assisted in situ keratomileusis; HTN hypertension; DM diabetes mellitus; COPD chronic obstructive pulmonary disease

## 2022-09-02 ENCOUNTER — Ambulatory Visit (INDEPENDENT_AMBULATORY_CARE_PROVIDER_SITE_OTHER): Payer: Medicare Other | Admitting: Ophthalmology

## 2022-09-02 ENCOUNTER — Encounter (INDEPENDENT_AMBULATORY_CARE_PROVIDER_SITE_OTHER): Payer: Self-pay | Admitting: Ophthalmology

## 2022-09-02 DIAGNOSIS — Z961 Presence of intraocular lens: Secondary | ICD-10-CM | POA: Diagnosis not present

## 2022-09-02 DIAGNOSIS — H353221 Exudative age-related macular degeneration, left eye, with active choroidal neovascularization: Secondary | ICD-10-CM

## 2022-09-02 DIAGNOSIS — H43823 Vitreomacular adhesion, bilateral: Secondary | ICD-10-CM

## 2022-09-02 DIAGNOSIS — H353231 Exudative age-related macular degeneration, bilateral, with active choroidal neovascularization: Secondary | ICD-10-CM

## 2022-09-02 DIAGNOSIS — H353211 Exudative age-related macular degeneration, right eye, with active choroidal neovascularization: Secondary | ICD-10-CM

## 2022-09-02 MED ORDER — AFLIBERCEPT 2MG/0.05ML IZ SOLN FOR KALEIDOSCOPE
2.0000 mg | INTRAVITREAL | Status: AC | PRN
  Administered 2022-09-02: 2 mg via INTRAVITREAL

## 2022-09-02 MED ORDER — BEVACIZUMAB CHEMO INJECTION 1.25MG/0.05ML SYRINGE FOR KALEIDOSCOPE
1.2500 mg | INTRAVITREAL | Status: AC | PRN
  Administered 2022-09-02: 1.25 mg via INTRAVITREAL

## 2022-09-04 ENCOUNTER — Other Ambulatory Visit: Payer: Self-pay

## 2022-09-04 ENCOUNTER — Emergency Department (HOSPITAL_COMMUNITY)
Admission: EM | Admit: 2022-09-04 | Discharge: 2022-09-04 | Disposition: A | Discharge status: Home / Self Care | Payer: Medicare Other | Attending: Emergency Medicine | Admitting: Emergency Medicine

## 2022-09-04 ENCOUNTER — Encounter (HOSPITAL_COMMUNITY): Payer: Self-pay

## 2022-09-04 DIAGNOSIS — X58XXXA Exposure to other specified factors, initial encounter: Secondary | ICD-10-CM | POA: Diagnosis not present

## 2022-09-04 DIAGNOSIS — S5012XA Contusion of left forearm, initial encounter: Secondary | ICD-10-CM | POA: Diagnosis not present

## 2022-09-04 DIAGNOSIS — Z7982 Long term (current) use of aspirin: Secondary | ICD-10-CM | POA: Insufficient documentation

## 2022-09-04 DIAGNOSIS — T8089XA Other complications following infusion, transfusion and therapeutic injection, initial encounter: Secondary | ICD-10-CM | POA: Insufficient documentation

## 2022-09-04 DIAGNOSIS — M79602 Pain in left arm: Secondary | ICD-10-CM | POA: Diagnosis present

## 2022-09-04 DIAGNOSIS — T801XXD Vascular complications following infusion, transfusion and therapeutic injection, subsequent encounter: Secondary | ICD-10-CM

## 2022-09-04 NOTE — ED Notes (Signed)
Discharge instructions reviewed with patient and family. Denies any questions or concerns at this time. Patient out to lobby with family.

## 2022-09-04 NOTE — Discharge Instructions (Signed)
1.  Continue to keep the arm elevated above the level of the heart on pillows or other comfortable material.  Continue to apply well wrapped ice packs to areas of swelling and pain. 2.  Take over-the-counter Tylenol every 4-6 hours for pain. 3.  At this time, you must continue to observe closely.  Within a couple of days of an IV infiltrate, there will be pain and swelling.  If symptoms are progressing and there is increasing swelling and pain after about 3 days, you will need a recheck to look for any signs of severe damage to the skin, blood clot or infection.  If there are concerning changes before the next 3 days, return to the emergency department immediately for recheck.

## 2022-09-04 NOTE — ED Provider Notes (Signed)
[facility] EMERGENCY DEPARTMENT Provider Note   CSN: [id number] Arrival date & time: 09/04/22  1023     History  Chief Complaint  Patient presents with   Arm Pain    Vanassa Penniman Bourdeau is a 86 y.o. female.  HPI Patient had an outpatient CT scan yesterday.  She had an IV in the left arm that infiltrated.  It was examined at that time and instructions provided for elevating icing.  Patient's daughter is giving history and translation.  She reports it has gotten more painful and there is some pain in the medial upper arm.  Also the swelling is persisting.  They were advised to come to the emergency department for recheck if there was concerning swelling or pain.  She has not taken anything at home for pain control.  No other complaints.    Home Medications Prior to Admission medications   Medication Sig Start Date End Date Taking? Authorizing Provider  amLODipine (NORVASC) 5 MG tablet Take 1 tablet (5 mg total) by mouth daily. 04/30/22 04/19/24  Elder Negus, MD  aspirin EC 81 MG tablet Take 1 tablet (81 mg total) by mouth daily. Swallow whole. 03/27/22   Patwardhan, Anabel Bene, MD  calcium carbonate (OSCAL) 1500 (600 Ca) MG TABS tablet Take 600 mg of elemental calcium by mouth 2 (two) times a day.    [provider]  carboxymethylcellulose (REFRESH PLUS) 0.5 % SOLN Place 1 drop into both eyes 3 (three) times daily as needed (dry/irritated eyes).     [provider]  Cholecalciferol (VITAMIN D3) 50 MCG (2000 UT) TABS Take 2,000 Units by mouth daily.    [provider]  nitroGLYCERIN (NITROSTAT) 0.4 MG SL tablet Place 1 tablet (0.4 mg total) under the tongue every 5 (five) minutes as needed for chest pain. 03/27/22 06/25/22  Patwardhan, Anabel Bene, MD  prednisoLONE acetate (PRED FORTE) 1 % ophthalmic suspension Place 1 drop into the right eye 4 (four) times daily. 05/05/19   Rennis Chris, MD  REFRESH OPTIVE MEGA-3 0.5-1-0.5 % SOLN Place 1 drop into  both eyes 3 (three) times daily as needed (discomfort/eye irritation.).    [provider]  simvastatin (ZOCOR) 40 MG tablet Take 20 mg by mouth every evening. 1/4 tablet at night    [provider]      Allergies    Patient has no known allergies.    Review of Systems   Review of Systems  Physical Exam Updated Vital Signs BP (!) 110/55   Pulse 65   Temp 99.1 F (37.3 C) (Oral)   Resp 18   Ht 5\' 3"  (1.6 m)   Wt 51.7 kg   SpO2 97%   BMI 20.19 kg/m  Physical Exam Constitutional:      Comments: Alert nontoxic well in appearance.  Well-nourished well-developed.  HENT:     Mouth/Throat:     Pharynx: Oropharynx is clear.  Eyes:     Extraocular Movements: Extraocular movements intact.  Pulmonary:     Effort: Pulmonary effort is normal.  Musculoskeletal:     Comments: Left forearm has a patch of erythema and ecchymosis.  There is moderate edematous swelling around the antecubital fossa and the dependent area of the upper forearm and upper arm.  Patient has some tenderness superior to the IV insertion site.  At this time I do not appreciate any palpable cords or induration in the upper arm.  Hand is warm and dry with good pulse.  See  attached images.     ED Results / Procedures / Treatments   Labs (all labs ordered are listed, but only abnormal results are displayed) Labs Reviewed - No data to display  EKG None  Radiology No results found.  Procedures Procedures    Medications Ordered in ED Medications - No data to display  ED Course/ Medical Decision Making/ A&P                           Medical Decision Making  Patient is 1 day after IV infiltration with IV contrast on the left.  Currently skin condition is erythematous but nothing has necrotic appearance.  Distal pulse is very good with hand warm and dry without significant swelling.  She has some tenderness in the medial upper arm but I do not appreciate any palpable cords to suggest  significant clot.  I have extensively reviewed the nature of IV infiltration and subsequent possible complications.  At this time findings are consistent with inflammatory reaction.  I have educated the patient's daughter on possibility of superficial thrombophlebitis DVT and cellulitis.  If the arm remains stable for the next couple of days and is improving they are to continue elevating, icing and Tylenol for pain.  If there are any significant concerning changes between now and the next 3 days or to return to the emergency department for recheck.  Patient discharged in good condition with plan for home management.        Final Clinical Impression(s) / ED Diagnoses Final diagnoses:  IV infiltrate, subsequent encounter    Rx / DC Orders ED Discharge Orders     None         Arby Barrette, MD 09/04/22 1300

## 2022-09-04 NOTE — ED Triage Notes (Signed)
Pt arrived POV from home c/o left arm pain and swelling after getting a CT yesterday Pt states the IV infiltrated yesterday and was sent home. Pt returned today c/o left arm pain and worsening swelling

## 2022-10-09 NOTE — Progress Notes (Signed)
[facility] - Clinic Note  10/21/2022     CHIEF COMPLAINT Patient presents for Retina Follow Up    HISTORY OF PRESENT ILLNESS: Lindsay Harvey is a 86 y.o. female who presents to the clinic today for:   HPI     Retina Follow Up   Patient presents with  Wet AMD.  This started 7 weeks ago.  Duration of 7 weeks.  I, the attending physician,  performed the HPI with the patient and updated documentation appropriately.        Comments   7 week retina follow up AMD OU and I'VE OD IVA OS pt is reporting no vision changes noticed she did have some pain in her right eye last week but is now gone mostly a pinching sensation pt saw her OD 2 weeks ago and was told she had glaucoma       Last edited by Rennis ChrisZamora, Ethanael Veith, MD on 10/21/2022 12:42 PM.    Pt states her eyes are very dry, she is using AT's 4-5 times a day, she was recently told that she has glaucoma, was not put on drops  Pt states vision is stable  Referring physician: Ralene OkMoreira, Roy, MD [address] [place] [id number]  HISTORICAL INFORMATION:   Selected notes from the MEDICAL RECORD NUMBER Referred by Dr. Marcha Solders. [name] for concern of AMD OU, CNV   CURRENT MEDICATIONS: Current Outpatient Medications (Ophthalmic Drugs)  Medication Sig   carboxymethylcellulose (REFRESH PLUS) 0.5 % SOLN Place 1 drop into both eyes 3 (three) times daily as needed (dry/irritated eyes).    prednisoLONE acetate (PRED FORTE) 1 % ophthalmic suspension Place 1 drop into the right eye 4 (four) times daily.   REFRESH OPTIVE MEGA-3 0.5-1-0.5 % SOLN Place 1 drop into both eyes 3 (three) times daily as needed (discomfort/eye irritation.).   No current facility-administered medications for this visit. (Ophthalmic Drugs)   Current Outpatient Medications (Other)  Medication Sig   amLODipine (NORVASC) 5 MG tablet Take 1 tablet (5 mg total) by mouth daily.   aspirin EC 81 MG tablet Take 1 tablet (81 mg total) by mouth daily. Swallow whole.    calcium carbonate (OSCAL) 1500 (600 Ca) MG TABS tablet Take 600 mg of elemental calcium by mouth 2 (two) times a day.   Cholecalciferol (VITAMIN D3) 50 MCG (2000 UT) TABS Take 2,000 Units by mouth daily.   nitroGLYCERIN (NITROSTAT) 0.4 MG SL tablet Place 1 tablet (0.4 mg total) under the tongue every 5 (five) minutes as needed for chest pain.   simvastatin (ZOCOR) 40 MG tablet Take 20 mg by mouth every evening. 1/4 tablet at night   Current Facility-Administered Medications (Other)  Medication Route   Bevacizumab (AVASTIN) SOLN 1.25 mg Intravitreal   Bevacizumab (AVASTIN) SOLN 1.25 mg Intravitreal   Bevacizumab (AVASTIN) SOLN 1.25 mg Intravitreal   Bevacizumab (AVASTIN) SOLN 1.25 mg Intravitreal   Bevacizumab (AVASTIN) SOLN 1.25 mg Intravitreal   Bevacizumab (AVASTIN) SOLN 1.25 mg Intravitreal   Bevacizumab (AVASTIN) SOLN 1.25 mg Intravitreal   Bevacizumab (AVASTIN) SOLN 1.25 mg Intravitreal   Bevacizumab (AVASTIN) SOLN 1.25 mg Intravitreal   Bevacizumab (AVASTIN) SOLN 1.25 mg Intravitreal   Bevacizumab (AVASTIN) SOLN 1.25 mg Intravitreal   Bevacizumab (AVASTIN) SOLN 1.25 mg Intravitreal   Bevacizumab (AVASTIN) SOLN 1.25 mg Intravitreal   REVIEW OF SYSTEMS: ROS   Positive for: Eyes Last edited by Rennis ChrisZamora, Dirck Butch, MD on 10/21/2022  9:30 AM.      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   Headache    Hypercholesterolemia    Macular degeneration    Wet OU   Wears glasses    Past Surgical History:  Procedure Laterality Date   25 GAUGE PARS PLANA VITRECTOMY WITH 20 GAUGE MVR PORT FOR MACULAR HOLE Right 04/07/2019   Procedure: 25 GAUGE PARS PLANA VITRECTOMY WITH 20 GAUGE MVR PORT FOR MACULAR HOLE;  Surgeon: Rennis Chris, MD;  Location: [facility] OR;  Service: Ophthalmology;  Laterality: Right;   CATARACT EXTRACTION Bilateral    2013   DILATION AND  CURETTAGE OF UTERUS     EYE SURGERY Bilateral 2013   Cat Sx   GAS/FLUID EXCHANGE Right 04/07/2019   Procedure: Gas/Fluid Exchange;  Surgeon: Rennis Chris, MD;  Location: [facility] OR;  Service: Ophthalmology;  Laterality: Right;   MEMBRANE PEEL Right 04/07/2019   Procedure: Eula Flax;  Surgeon: Rennis Chris, MD;  Location: [facility] OR;  Service: Ophthalmology;  Laterality: Right;   PHOTOCOAGULATION WITH LASER Right 04/07/2019   Procedure: Photocoagulation With Laser;  Surgeon: Rennis Chris, MD;  Location: [facility] OR;  Service: Ophthalmology;  Laterality: Right;   YAG LASER APPLICATION Bilateral    FAMILY HISTORY Family History  Problem Relation Age of Onset   Stroke Father    SOCIAL HISTORY Social History   Tobacco Use   Smoking status: Never   Smokeless tobacco: Never  Vaping Use   Vaping Use: Never used  Substance Use Topics   Alcohol use: No   Drug use: No       OPHTHALMIC EXAM: Base Eye Exam     Visual Acuity (Snellen - Linear)       Right Left   Dist [place] 20/70 -2 20/70 -1   Dist ph [place] NI NI         Tonometry (Tonopen, 8:36 AM)       Right Left   Pressure 6 11         Pupils       Pupils Dark Light Shape React APD   Right PERRL 3 2 Round Brisk None   Left PERRL 3 2 Round Brisk None         Visual Fields       Left Right    Full Full         Extraocular Movement       Right Left    Full, Ortho Full, Ortho         Neuro/Psych     Oriented x3: Yes   Mood/Affect: Normal         Dilation     Both eyes: 2.5% Phenylephrine @ 8:36 AM           Slit Lamp and Fundus Exam     Slit Lamp Exam       Right Left   Lids/Lashes Dermatochalasis - upper lid, periorbital edema Dermatochalasis - upper lid   Conjunctiva/Sclera White and quiet White and quiet   Cornea Arcus, 1+ fine Punctate epithelial erosions, mild endo pigment Arcus, 3+ Punctate epithelial erosions, mild tear film debris, mild endo pigment, dry tear fil, decreased TBUT   Anterior  Chamber Deep, quiet Deep and quiet   Iris Round and dilated Round and dilated   Lens PC IOL in good postion with anterior capsular phimosis, 1+ Posterior capsular opacification PC IOL in good  postion with anterior capsular phimosis, 1+ Posterior capsular opacification sparing center   Anterior Vitreous post vitrectomy Vitreous syneresis         Fundus Exam       Right Left   Disc Sharp rim, mild pallor, peripapillary drusen, PPP Pink and Sharp, central pallor, +cupping, +PPP, peripapillary drusen   C/D Ratio 0.5 0.75   Macula flat; ERM/VMT gone, blunted foveal reflex, refractile drusen, trace cystic changes - stably improved, pigment clumping, focal IRH centrally - fading, +atrophy Blunted foveal reflex, refractile Drusen, early Atrophy, RPE mottling and clumping, +PEDs/CNV, +cystic changes -- stably improved, no heme   Vessels attenuated, mild tortuosity attenuated, mild tortuosity   Periphery Attached, scattered peripheral drusen, peripheral laser changes superiorly Attached with peripheral drusen           IMAGING AND PROCEDURES  Imaging and Procedures for 02/09/18  OCT, Retina - OU - Both Eyes       Right Eye Quality was good. Central Foveal Thickness: 240. Progression has been stable. Findings include no IRF, no SRF, abnormal foveal contour, retinal drusen , subretinal hyper-reflective material, intraretinal hyper-reflective material, pigment epithelial detachment, outer retinal atrophy (stable improvement in IRF and SRHM temporal macula ).   Left Eye Quality was good. Central Foveal Thickness: 265. Progression has been stable. Findings include no SRF, abnormal foveal contour, retinal drusen , subretinal hyper-reflective material, epiretinal membrane, intraretinal fluid, pigment epithelial detachment, vitreous traction, outer retinal atrophy (Stable improvement in IRF inferior macula, persistent VMT w/ cystic changes -- slightly improved).   Notes Images taken, stored on  drive  Diagnosis / Impression:  OD: Exudative ARMD -- stable improvement in IRF and SRHM temporal macula  OS: exudative ARMD -- Stable improvement in IRF inferior macula, persistent VMT w/ cystic changes -- slightly improved  Clinical management:  See below  Abbreviations: NFP - Normal foveal profile. CME - cystoid macular edema. PED - pigment epithelial detachment. IRF - intraretinal fluid. SRF - subretinal fluid. EZ - ellipsoid zone. ERM - epiretinal membrane. ORA - outer retinal atrophy. ORT - outer retinal tubulation. SRHM - subretinal hyper-reflective material       Intravitreal Injection, Pharmacologic Agent - OD - Right Eye       Time Out 10/21/2022. 9:14 AM. Confirmed correct patient, procedure, site, and patient consented.   Anesthesia Topical anesthesia was used. Anesthetic medications included Lidocaine 2%, Proparacaine 0.5%.   Procedure Preparation included 5% betadine to ocular surface, eyelid speculum. A (32g) needle was used.   Injection: 2 mg aflibercept 2 MG/0.05ML   Route: Intravitreal, Site: Right Eye   NDC: L6038910, Lot: [id number], Expiration date: 01/10/2024, Waste: 0 mL   Post-op Post injection exam found visual acuity of at least counting fingers. The patient tolerated the procedure well. There were no complications. The patient received written and verbal post procedure care education. Post injection medications were not given.      Intravitreal Injection, Pharmacologic Agent - OS - Left Eye       Time Out 10/21/2022. 9:16 AM. Confirmed correct patient, procedure, site, and patient consented.   Anesthesia Topical anesthesia was used. Anesthetic medications included Lidocaine 2%, Proparacaine 0.5%.   Procedure Preparation included 5% betadine to ocular surface, eyelid speculum. A (32g) needle was used.   Injection: 1.25 mg Bevacizumab 1.25mg /0.76ml   Route: Intravitreal, Site: Left Eye   NDC: P3213405, Lot: [id number], Expiration date:  11/11/2022   Post-op Post injection exam found visual acuity of at least counting fingers. The patient tolerated the  procedure well. There were no complications. The patient received written and verbal post procedure care education. Post injection medications were not given.            ASSESSMENT/PLAN:    ICD-10-CM   1. 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 Exudative age-related macular degeneration of left eye with active choroidal neovascularization (HCC)  H35.3221 OCT, Retina - OU - Both Eyes    Intravitreal Injection, Pharmacologic Agent - OS - Left Eye    Bevacizumab (AVASTIN) SOLN 1.25 mg    3. 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 Pseudophakia of both eyes  Z96.1      1. 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  - S/P IVA OD #1 (12.03.18), #2 (01.02.19), #3 (01.30.19), #4 (03.01.19), #5 (04.01.19), #6 (04.30.19), #7 (06.05.19), #8 (07.19.19), #9 (08.30.19), #10 (10.14.19), #11 (11.18.19), #12 (12.30.19), #13 (02.10.20), #14 (04.06.20), #15 (09.17.20), #16 (10.15.20), #17 (12.01.20), #18 (01.18.21), #19 (03.15.21), #20 (5.11.21), #21 (07.20.21), #22 (10.14.21), #23 (01.10.22), #24 (03.07.22), #24 (05.23.22), #25 (9.1.22), #26 (11.01.22), #27 (12.28.22), # 28 (02.14.23), #29 (03.28.23) -- IVA resistance  - s/p IVE OD #1 (05.09.23) sample, #2 (06.14.23), #3 (07.12.23), #4 (08.22.23), #5 (10.03.23)., #6 (11.21.23)  - FA (04.01.19) shows mild CNVM OD consistent with exudative ARMD  - OCT shows OD: stable improvement in IRF and SRHM temporal macula at 7 wks  - BCVA improved to 20/70 from 20/80   - recommend IVE OD #7 today, 1.09.23 w/ ext f/u in 8 wks  - pt wishes to proceed with Eylea  - RBA of procedure discussed,  questions answered  - see procedure note  - Eylea informed consent form signed and scanned on 05.09.23 (OU)  - f/u 8 weeks -- DFE/OCT/possible injection  2. Age related macular degeneration, exudative, OS  - interval conversion from non-exudative to exudative noted 07.20.21  - s/p IVA OS #1 (07.20.21), #2 (08.19.21), #3 (09.16.21), #4 (10.14.21), #5 (11.29.21), #6 (01.10.22), #7 (03.07.22), #8 (05.23.22), #9 (09.01.22), #10 (03.28.23), #11 (05.09.23), #12 (06.14.23), #13 (07.12.23), #14 (08.22.23), #15 (10.03.23), #16 (11.21.23)  - exam shows no heme  - OCT with OS: stable improvement in IRF inferior macula, persistent VMT w/ cystic changes -- slightly improved at 7 wks  - BCVA decreased to 20/70 from 20/60 today   - recommend IVA OS #17 today, 1.09.23 w/ ext f/u in 8 wks  - RBA of procedure discussed, questions answered - informed consent obtained and signed - see procedure note - IVA informed consent obtained, signed and scanned, 07.20.21 - f/u 8 weeks, DFE, OCT  3. 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 Pseudophakia OU  - s/p CE/IOL OU  -  s/p YAG OU  - beautiful surgeries, doing well  - continue to monitor  Ophthalmic Meds Ordered this visit:  Meds ordered this encounter  Medications   Bevacizumab (AVASTIN) SOLN 1.25 mg   aflibercept (EYLEA) SOLN 2 mg     Return in about 8 weeks (around 12/16/2022) for f/u exu ARMD OU, DFE, OCT, possible injxn.  There are no Patient Instructions on file for this visit.  This document serves as a record of services personally performed by Karie Chimera, MD, PhD. It was created on their behalf by Gerilyn Nestle, COT an ophthalmic technician. The creation of this record is the provider's dictation and/or activities during the visit.    Electronically signed by:  Gerilyn Nestle, COT  12.28.23 12:42 PM   This document serves as a record of services personally performed by Karie Chimera, MD, PhD. It was created on their behalf by Glee Arvin. Manson Passey, OA an ophthalmic technician. The creation of this record is the provider's dictation and/or activities during the visit.    Electronically signed by: Glee Arvin. Manson Passey, [place] 01.09.2024 12:42 PM  Karie Chimera, M.D., Ph.D. Diseases & Surgery of the Retina and Vitreous [facility]  I have reviewed the above documentation for accuracy and completeness, and I agree with the above. Karie Chimera, M.D., Ph.D. 10/21/22 12:43 PM   Abbreviations: M myopia (nearsighted); A astigmatism; H hyperopia (farsighted); P presbyopia; Mrx spectacle prescription;  CTL contact lenses; OD right eye; OS left eye; OU both eyes  XT exotropia; ET esotropia; PEK punctate epithelial keratitis; PEE punctate epithelial erosions; DES dry eye syndrome; MGD meibomian gland dysfunction; ATs artificial tears; PFAT's preservative free artificial tears; NSC nuclear sclerotic cataract; PSC posterior subcapsular cataract; ERM epi-retinal membrane; PVD posterior vitreous detachment; RD retinal detachment; DM diabetes mellitus; DR diabetic retinopathy; NPDR non-proliferative diabetic retinopathy; PDR proliferative diabetic retinopathy; CSME clinically significant macular edema; DME diabetic macular edema; dbh dot blot hemorrhages; CWS cotton wool spot; POAG primary open angle glaucoma; C/D cup-to-disc ratio; HVF humphrey visual field; GVF goldmann visual field; OCT optical coherence tomography; IOP intraocular pressure; BRVO Branch retinal vein occlusion; CRVO central retinal vein occlusion; CRAO central retinal artery occlusion; BRAO branch retinal artery occlusion; RT  retinal tear; SB scleral buckle; PPV pars plana vitrectomy; VH Vitreous hemorrhage; PRP panretinal laser photocoagulation; IVK intravitreal kenalog; VMT vitreomacular traction; MH Macular hole;  NVD neovascularization of the disc; NVE neovascularization elsewhere; AREDS age related eye disease study; ARMD age related macular degeneration; POAG primary open angle glaucoma; EBMD epithelial/anterior basement membrane dystrophy; ACIOL anterior chamber intraocular lens; IOL intraocular lens; PCIOL posterior chamber intraocular lens; Phaco/IOL phacoemulsification with intraocular lens placement; PRK photorefractive keratectomy; LASIK laser assisted in situ keratomileusis; HTN hypertension; DM diabetes mellitus; COPD chronic obstructive pulmonary disease

## 2022-10-21 ENCOUNTER — Encounter (INDEPENDENT_AMBULATORY_CARE_PROVIDER_SITE_OTHER): Payer: Self-pay | Admitting: Ophthalmology

## 2022-10-21 ENCOUNTER — Ambulatory Visit (INDEPENDENT_AMBULATORY_CARE_PROVIDER_SITE_OTHER): Payer: Medicare Other | Admitting: Ophthalmology

## 2022-10-21 DIAGNOSIS — H353231 Exudative age-related macular degeneration, bilateral, with active choroidal neovascularization: Secondary | ICD-10-CM | POA: Diagnosis not present

## 2022-10-21 DIAGNOSIS — H353221 Exudative age-related macular degeneration, left eye, with active choroidal neovascularization: Secondary | ICD-10-CM

## 2022-10-21 DIAGNOSIS — H43823 Vitreomacular adhesion, bilateral: Secondary | ICD-10-CM | POA: Diagnosis not present

## 2022-10-21 DIAGNOSIS — Z961 Presence of intraocular lens: Secondary | ICD-10-CM

## 2022-10-21 DIAGNOSIS — H353211 Exudative age-related macular degeneration, right eye, with active choroidal neovascularization: Secondary | ICD-10-CM

## 2022-10-21 MED ORDER — AFLIBERCEPT 2MG/0.05ML IZ SOLN FOR KALEIDOSCOPE
2.0000 mg | INTRAVITREAL | Status: AC | PRN
  Administered 2022-10-21: 2 mg via INTRAVITREAL

## 2022-10-21 MED ORDER — BEVACIZUMAB CHEMO INJECTION 1.25MG/0.05ML SYRINGE FOR KALEIDOSCOPE
1.2500 mg | INTRAVITREAL | Status: AC | PRN
  Administered 2022-10-21: 1.25 mg via INTRAVITREAL

## 2022-11-25 NOTE — Progress Notes (Signed)
[facility] Note  12/09/2022     CHIEF COMPLAINT Patient presents for Retina Follow Up    HISTORY OF PRESENT ILLNESS: Lindsay Harvey is a 87 y.o. female who presents to the clinic today for:   HPI     Retina Follow Up   Patient presents with  Wet AMD.  This started years ago.  Duration of 7 weeks.  I, the attending physician,  performed the HPI with the patient and updated documentation appropriately.        Comments   Patient feels that the vision is a little better. She is using AT's OU PRN. She feels the dryness is better as well.      Last edited by [name], MD on 12/09/2022  9:43 PM.    Pt is still waiting on her glasses to be made  Referring physician: [name], MD [id number],  [place] [address]  HISTORICAL INFORMATION:   Selected notes from the MEDICAL RECORD NUMBER Referred by Dr. [name] for concern of AMD OU, CNV   CURRENT MEDICATIONS: Current Outpatient Medications (Ophthalmic Drugs)  Medication Sig   carboxymethylcellulose (REFRESH PLUS) 0.5 % SOLN Place 1 drop into both eyes 3 (three) times daily as needed (dry/irritated eyes).    prednisoLONE acetate (PRED FORTE) 1 % ophthalmic suspension Place 1 drop into the right eye 4 (four) times daily.   REFRESH OPTIVE MEGA-3 0.5-1-0.5 % SOLN Place 1 drop into both eyes 3 (three) times daily as needed (discomfort/eye irritation.).   No current facility-administered medications for this visit. (Ophthalmic Drugs)   Current Outpatient Medications (Other)  Medication Sig   amLODipine (NORVASC) 5 MG tablet Take 1 tablet (5 mg total) by mouth daily.   aspirin EC 81 MG tablet Take 1 tablet (81 mg total) by mouth daily. Swallow whole.   calcium carbonate (OSCAL) 1500 (600 Ca) MG TABS tablet Take 600 mg of elemental calcium by mouth 2 (two) times a day.   Cholecalciferol (VITAMIN D3) 50 MCG (2000 UT) TABS Take 2,000 Units by mouth daily.   nitroGLYCERIN (NITROSTAT) 0.4 MG  SL tablet Place 1 tablet (0.4 mg total) under the tongue every 5 (five) minutes as needed for chest pain.   simvastatin (ZOCOR) 40 MG tablet Take 20 mg by mouth every evening. 1/4 tablet at night   Current Facility-Administered Medications (Other)  Medication Route   Bevacizumab (AVASTIN) SOLN 1.25 mg Intravitreal   Bevacizumab (AVASTIN) SOLN 1.25 mg Intravitreal   Bevacizumab (AVASTIN) SOLN 1.25 mg Intravitreal   Bevacizumab (AVASTIN) SOLN 1.25 mg Intravitreal   Bevacizumab (AVASTIN) SOLN 1.25 mg Intravitreal   Bevacizumab (AVASTIN) SOLN 1.25 mg Intravitreal   Bevacizumab (AVASTIN) SOLN 1.25 mg Intravitreal   Bevacizumab (AVASTIN) SOLN 1.25 mg Intravitreal   Bevacizumab (AVASTIN) SOLN 1.25 mg Intravitreal   Bevacizumab (AVASTIN) SOLN 1.25 mg Intravitreal   Bevacizumab (AVASTIN) SOLN 1.25 mg Intravitreal   Bevacizumab (AVASTIN) SOLN 1.25 mg Intravitreal   Bevacizumab (AVASTIN) SOLN 1.25 mg Intravitreal   REVIEW OF SYSTEMS: ROS   Positive for: Gastrointestinal, Eyes Negative for: Constitutional, Neurological, Skin, Genitourinary, Musculoskeletal, HENT, Endocrine, Cardiovascular, Respiratory, Psychiatric, Allergic/Imm, Heme/Lymph Last edited by Annie Paras, COT on 12/09/2022  8:24 AM.     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   Headache    Hypercholesterolemia  Macular degeneration    Wet OU   Wears glasses    Past Surgical History:  Procedure Laterality Date   25 GAUGE PARS PLANA VITRECTOMY WITH 20 GAUGE MVR PORT FOR MACULAR HOLE Right 04/07/2019   Procedure: 25 GAUGE PARS PLANA VITRECTOMY WITH 20 GAUGE MVR PORT FOR MACULAR HOLE;  Surgeon: [name], MD;  Location: [facility];  Service: Ophthalmology;  Laterality: Right;   CATARACT EXTRACTION Bilateral    2013   DILATION AND CURETTAGE OF UTERUS     EYE SURGERY Bilateral 2013   Cat  Sx   GAS/FLUID EXCHANGE Right 04/07/2019   Procedure: Gas/Fluid Exchange;  Surgeon: [name], MD;  Location: [facility];  Service: Ophthalmology;  Laterality: Right;   MEMBRANE PEEL Right 04/07/2019   Procedure: [name];  Surgeon: [name], MD;  Location: [facility];  Service: Ophthalmology;  Laterality: Right;   PHOTOCOAGULATION WITH LASER Right 04/07/2019   Procedure: Photocoagulation With Laser;  Surgeon: [name], MD;  Location: [facility];  Service: Ophthalmology;  Laterality: Right;   YAG LASER APPLICATION Bilateral    FAMILY HISTORY Family History  Problem Relation Age of Onset   Stroke Father    SOCIAL HISTORY Social History   Tobacco Use   Smoking status: Never   Smokeless tobacco: Never  Vaping Use   Vaping Use: Never used  Substance Use Topics   Alcohol use: No   Drug use: No       OPHTHALMIC EXAM: Base Eye Exam     Visual Acuity (Snellen - Linear)       Right Left   Dist [place] 20/70 +2 20/70   Dist ph [place] NI NI         Tonometry (Tonopen, 8:28 AM)       Right Left   Pressure 13 15         Pupils       Dark Light Shape React APD   Right 3 2 Round Brisk None   Left 3 2 Round Brisk None         Visual Fields       Left Right    Full Full         Extraocular Movement       Right Left    Full, Ortho Full, Ortho         Neuro/Psych     Oriented x3: Yes   Mood/Affect: Normal         Dilation     Both eyes: 1.0% Mydriacyl, 2.5% Phenylephrine @ 8:24 AM           Slit Lamp and Fundus Exam     Slit Lamp Exam       Right Left   Lids/Lashes Dermatochalasis - upper lid, periorbital edema Dermatochalasis - upper lid   Conjunctiva/Sclera White and quiet White and quiet   Cornea Arcus, 1+ fine Punctate epithelial erosions, mild endo pigment Arcus, 3+ Punctate epithelial erosions, mild tear film debris, mild endo pigment, dry tear fil, decreased TBUT   Anterior Chamber Deep, quiet Deep and quiet   Iris Round and dilated Round  and dilated   Lens PC IOL in good postion with anterior capsular phimosis, 1+ Posterior capsular opacification PC IOL in good postion with anterior capsular phimosis, 1+ Posterior capsular opacification sparing center   Anterior Vitreous post vitrectomy Vitreous syneresis         Fundus Exam       Right Left   Disc Sharp rim,  mild pallor, peripapillary drusen, PPP Pink and Sharp, central pallor, +cupping, +PPP, peripapillary drusen   C/D Ratio 0.5 0.75   Macula flat; ERM/VMT gone, blunted foveal reflex, refractile drusen, trace cystic changes - stably improved, pigment clumping, focal IRH centrally - resolved, +atrophy Blunted foveal reflex, refractile Drusen, early Atrophy, RPE mottling and clumping, +PEDs/CNV, +cystic changes -- stably improved, no heme   Vessels attenuated, mild tortuosity attenuated, mild tortuosity   Periphery Attached, scattered peripheral drusen, peripheral laser changes superiorly Attached with peripheral drusen           IMAGING AND PROCEDURES  Imaging and Procedures for 02/09/18  OCT, Retina - OU - Both Eyes       Right Eye Quality was good. Central Foveal Thickness: 234. Progression has been stable. Findings include no IRF, no SRF, abnormal foveal contour, retinal drusen , subretinal hyper-reflective material, intraretinal hyper-reflective material, pigment epithelial detachment, outer retinal atrophy (stable improvement in IRF and SRHM temporal macula ).   Left Eye Quality was good. Central Foveal Thickness: 257. Progression has been stable. Findings include no SRF, abnormal foveal contour, retinal drusen , subretinal hyper-reflective material, epiretinal membrane, intraretinal fluid, pigment epithelial detachment, vitreous traction, outer retinal atrophy (Stable improvement in IRF inferior macula, persistent VMT w/ cystic changes, mild interval improvement in low PED / [facility] inferior macula).   Notes Images taken, stored on drive  Diagnosis /  Impression:  OD: Exudative ARMD -- stable improvement in IRF and SRHM temporal macula  OS: exudative ARMD -- Stable improvement in IRF inferior macula, persistent VMT w/ cystic changes, mild interval improvement in low PED / [facility] inferior macula  Clinical management:  See below  Abbreviations: NFP - Normal foveal profile. CME - cystoid macular edema. PED - pigment epithelial detachment. IRF - intraretinal fluid. SRF - subretinal fluid. EZ - ellipsoid zone. ERM - epiretinal membrane. ORA - outer retinal atrophy. ORT - outer retinal tubulation. SRHM - subretinal hyper-reflective material       Intravitreal Injection, Pharmacologic Agent - OD - Right Eye       Time Out 12/09/2022. 8:33 AM. Confirmed correct patient, procedure, site, and patient consented.   Anesthesia Topical anesthesia was used. Anesthetic medications included Lidocaine 2%, Proparacaine 0.5%.   Procedure Preparation included 5% betadine to ocular surface, eyelid speculum. A (32g) needle was used.   Injection: 2 mg aflibercept 2 MG/0.05ML   Route: Intravitreal, Site: Right Eye   NDC: [id number], Lot: [id number], Expiration date: 12/11/2023, Waste: 0 mL   Post-op Post injection exam found visual acuity of at least counting fingers. The patient tolerated the procedure well. There were no complications. The patient received written and verbal post procedure care education. Post injection medications were not given.      Intravitreal Injection, Pharmacologic Agent - OS - Left Eye       Time Out 12/09/2022. 8:33 AM. Confirmed correct patient, procedure, site, and patient consented.   Anesthesia Topical anesthesia was used. Anesthetic medications included Lidocaine 2%, Proparacaine 0.5%.   Procedure Preparation included 5% betadine to ocular surface, eyelid speculum. A (32g) needle was used.   Injection: 1.25 mg Bevacizumab 1.'25mg'$ /0.60m   Route: Intravitreal, Site: Left Eye   NDC: [id number]  Expiration date: 02/21/2023   Post-op Post injection exam found visual acuity of at least counting fingers. The patient tolerated the procedure well. There were no complications. The patient received written and verbal post procedure care education. Post injection medications were not given.  ASSESSMENT/PLAN:    ICD-10-CM   1. 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 Exudative age-related macular degeneration of left eye with active choroidal neovascularization (HCC)  H35.3221 OCT, Retina - OU - Both Eyes    Intravitreal Injection, Pharmacologic Agent - OS - Left Eye    Bevacizumab (AVASTIN) SOLN 1.25 mg    3. 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 Pseudophakia of both eyes  Z96.1      1. 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  - S/P IVA OD #1 (12.03.18), #2 (01.02.19), #3 (01.30.19), #4 (03.01.19), #5 (04.01.19), #6 (04.30.19), #7 (06.05.19), #8 (07.19.19), #9 (08.30.19), #10 (10.14.19), #11 (11.18.19), #12 (12.30.19), #13 (02.10.20), #14 (04.06.20), #15 (09.17.20), #16 (10.15.20), #17 (12.01.20), #18 (01.18.21), #19 (03.15.21), #20 (5.11.21), #21 (07.20.21), #22 (10.14.21), #23 (01.10.22), #24 (03.07.22), #24 (05.23.22), #25 (9.1.22), #26 (11.01.22), #27 (12.28.22), # 28 (02.14.23), #29 (03.28.23) -- IVA resistance  - s/p IVE OD #1 (05.09.23) sample, #2 (06.14.23), #3 (07.12.23), #4 (08.22.23), #5 (10.03.23)., #6 (11.21.23), #7 (01.09.24)  - FA (04.01.19) shows mild CNVM OD consistent with exudative ARMD  - OCT shows OD: stable improvement in IRF and SRHM temporal macula at 7 wks  - BCVA OD 20/70 - stable  - recommend IVE OD #8 today, 02.27.24 w/ ext f/u in 8 wks  - pt wishes to proceed with Eylea  - RBA of  procedure discussed, questions answered  - see procedure note  - Eylea informed consent form signed and scanned on 05.09.23 (OU)  - f/u 8 weeks -- DFE/OCT/possible injection  2. Age related macular degeneration, exudative, OS  - interval conversion from non-exudative to exudative noted 07.20.21  - s/p IVA OS #1 (07.20.21), #2 (08.19.21), #3 (09.16.21), #4 (10.14.21), #5 (11.29.21), #6 (01.10.22), #7 (03.07.22), #8 (05.23.22), #9 (09.01.22), #10 (03.28.23), #11 (05.09.23), #12 (06.14.23), #13 (07.12.23), #14 (08.22.23), #15 (10.03.23), #16 (11.21.23), #17 (01.09.24)  - exam shows no heme  - OCT with OS: Stable improvement in IRF inferior macula, persistent VMT w/ cystic changes, mild interval improvement in low PED / [facility] inferior macula at 7 wks  - BCVA OS 20/70 - stable  - recommend IVA OS #18 today, 02.27.24 w/ ext f/u in 8 wks  - RBA of procedure discussed, questions answered - informed consent obtained and signed - see procedure note - IVA informed consent obtained, signed and scanned, 07.20.21 - f/u 8 weeks, DFE, OCT  3. 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 Pseudophakia OU  - s/p CE/IOL OU  - s/p YAG OU  - beautiful surgeries, doing well  - continue to monitor  Ophthalmic Meds Ordered this visit:  Meds ordered this encounter  Medications   Bevacizumab (AVASTIN) SOLN 1.25 mg   aflibercept (EYLEA) SOLN 2 mg     Return in about 8 weeks (around 02/03/2023) for f/u exu ARMD OU, DFE, OCT, possible injxn.  There are no Patient Instructions on file for this visit.  This document serves as a record of services personally performed by Gardiner Sleeper, MD, PhD. It was created on their behalf by Renaldo Reel, [place] an ophthalmic technician. The creation of this record is the provider's dictation and/or activities during the visit.    Electronically signed by:  Renaldo Reel, COT  02.13.24 9:45 PM  . This document serves as a record of services personally performed by Gardiner Sleeper, MD, PhD. It was created on their behalf by San Jetty. Owens Shark, OA an ophthalmic technician. The creation of this record is the provider's dictation and/or activities during the visit.    Electronically signed by: San Jetty. Owens Shark, [place] 02.27.2024 9:45 PM  Gardiner Sleeper, M.D., Ph.D. Diseases & Surgery of the Retina and Vitreous [facility]  I have reviewed the above documentation for accuracy and completeness, and I agree with the above. Gardiner Sleeper, M.D., Ph.D. 12/09/22 9:46 PM  Abbreviations: M myopia (nearsighted); A astigmatism; H hyperopia (farsighted); P presbyopia; Mrx spectacle prescription;  CTL contact lenses; OD right eye; OS left eye; OU both eyes  XT exotropia; ET esotropia; PEK punctate epithelial keratitis; PEE punctate epithelial erosions; DES dry eye syndrome; MGD meibomian gland dysfunction; ATs artificial tears; PFAT's preservative free artificial tears; [place] nuclear sclerotic cataract; PSC posterior subcapsular cataract; ERM epi-retinal membrane; PVD posterior vitreous detachment; RD retinal detachment; DM diabetes mellitus; DR diabetic retinopathy; NPDR non-proliferative diabetic retinopathy; PDR proliferative diabetic retinopathy; CSME clinically significant macular edema; DME diabetic macular edema; dbh dot blot hemorrhages; CWS cotton wool spot; POAG primary open angle glaucoma; C/D cup-to-disc ratio; HVF humphrey visual field; GVF goldmann visual field; OCT optical coherence tomography; IOP intraocular pressure; BRVO Branch retinal vein occlusion; CRVO central retinal vein occlusion; CRAO central retinal  artery occlusion; BRAO branch retinal artery occlusion; RT retinal tear; SB scleral buckle; PPV pars plana vitrectomy; VH Vitreous hemorrhage; PRP panretinal laser photocoagulation; IVK intravitreal kenalog; VMT vitreomacular traction; MH Macular hole;  NVD neovascularization of the disc; NVE neovascularization elsewhere; AREDS age related eye disease study; ARMD age related macular degeneration; POAG primary open angle glaucoma; EBMD epithelial/anterior basement membrane dystrophy; ACIOL anterior chamber intraocular lens; IOL intraocular lens; PCIOL posterior chamber intraocular lens; Phaco/IOL phacoemulsification with intraocular lens placement; [place] photorefractive keratectomy; LASIK laser assisted in situ keratomileusis; HTN hypertension; DM diabetes mellitus; COPD chronic obstructive pulmonary disease

## 2022-12-09 ENCOUNTER — Ambulatory Visit (INDEPENDENT_AMBULATORY_CARE_PROVIDER_SITE_OTHER): Payer: Medicare Other | Admitting: Ophthalmology

## 2022-12-09 ENCOUNTER — Encounter (INDEPENDENT_AMBULATORY_CARE_PROVIDER_SITE_OTHER): Payer: Self-pay | Admitting: Ophthalmology

## 2022-12-09 DIAGNOSIS — H43823 Vitreomacular adhesion, bilateral: Secondary | ICD-10-CM

## 2022-12-09 DIAGNOSIS — Z961 Presence of intraocular lens: Secondary | ICD-10-CM

## 2022-12-09 DIAGNOSIS — H353211 Exudative age-related macular degeneration, right eye, with active choroidal neovascularization: Secondary | ICD-10-CM

## 2022-12-09 DIAGNOSIS — H353231 Exudative age-related macular degeneration, bilateral, with active choroidal neovascularization: Secondary | ICD-10-CM

## 2022-12-09 DIAGNOSIS — H353221 Exudative age-related macular degeneration, left eye, with active choroidal neovascularization: Secondary | ICD-10-CM

## 2022-12-09 MED ORDER — AFLIBERCEPT 2MG/0.05ML IZ SOLN FOR KALEIDOSCOPE
2.0000 mg | INTRAVITREAL | Status: AC | PRN
  Administered 2022-12-09: 2 mg via INTRAVITREAL

## 2022-12-09 MED ORDER — BEVACIZUMAB CHEMO INJECTION 1.25MG/0.05ML SYRINGE FOR KALEIDOSCOPE
1.2500 mg | INTRAVITREAL | Status: AC | PRN
  Administered 2022-12-09: 1.25 mg via INTRAVITREAL

## 2023-01-20 NOTE — Progress Notes (Signed)
[facility] - Clinic Note  02/03/2023     CHIEF COMPLAINT Patient presents for Retina Follow Up    HISTORY OF PRESENT ILLNESS: Lindsay Harvey is a 87 y.o. 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  This started 8 weeks ago.  Duration of 8 weeks.  Since onset it is stable.  I, the attending physician,  performed the HPI with the patient and updated documentation appropriately.        Comments   8 week retina follow up AMD OU pt is reporting no vision changes noticed she is having some dryness she denies any flashes or floaters       Last edited by Rennis Chris, MD on 02/03/2023  5:19 PM.    Pt got a new glasses rx, but she states she cannot see out of them, she states the bifocal works for reading, but the distance does not work, she states she can see better with her glasses off  Referring physician: Ralene Ok, MD [address] [place],  [place] [address]  HISTORICAL INFORMATION:   Selected notes from the MEDICAL RECORD NUMBER Referred by Dr. Marcha Solders for concern of AMD OU, CNV   CURRENT MEDICATIONS: Current Outpatient Medications (Ophthalmic Drugs)  Medication Sig   carboxymethylcellulose (REFRESH PLUS) 0.5 % SOLN Place 1 drop into both eyes 3 (three) times daily as needed (dry/irritated eyes).    prednisoLONE acetate (PRED FORTE) 1 % ophthalmic suspension Place 1 drop into the right eye 4 (four) times daily.   REFRESH OPTIVE MEGA-3 0.5-1-0.5 % SOLN Place 1 drop into both eyes 3 (three) times daily as needed (discomfort/eye irritation.).   No current facility-administered medications for this visit. (Ophthalmic Drugs)   Current Outpatient Medications (Other)  Medication Sig   amLODipine (NORVASC) 5 MG tablet Take 1 tablet (5 mg total) by mouth daily.   aspirin EC 81 MG tablet Take 1 tablet (81 mg total) by mouth daily. Swallow whole.   calcium carbonate (OSCAL) 1500  (600 Ca) MG TABS tablet Take 600 mg of elemental calcium by mouth 2 (two) times a day.   Cholecalciferol (VITAMIN D3) 50 MCG (2000 UT) TABS Take 2,000 Units by mouth daily.   nitroGLYCERIN (NITROSTAT) 0.4 MG SL tablet Place 1 tablet (0.4 mg total) under the tongue every 5 (five) minutes as needed for chest pain.   simvastatin (ZOCOR) 40 MG tablet Take 20 mg by mouth every evening. 1/4 tablet at night   Current Facility-Administered Medications (Other)  Medication Route   Bevacizumab (AVASTIN) SOLN 1.25 mg Intravitreal   Bevacizumab (AVASTIN) SOLN 1.25 mg Intravitreal   Bevacizumab (AVASTIN) SOLN 1.25 mg Intravitreal   Bevacizumab (AVASTIN) SOLN 1.25 mg Intravitreal   Bevacizumab (AVASTIN) SOLN 1.25 mg Intravitreal   Bevacizumab (AVASTIN) SOLN 1.25 mg Intravitreal   Bevacizumab (AVASTIN) SOLN 1.25 mg Intravitreal   Bevacizumab (AVASTIN) SOLN 1.25 mg Intravitreal   Bevacizumab (AVASTIN) SOLN 1.25 mg Intravitreal   Bevacizumab (AVASTIN) SOLN 1.25 mg Intravitreal   Bevacizumab (AVASTIN) SOLN 1.25 mg Intravitreal   Bevacizumab (AVASTIN) SOLN 1.25 mg Intravitreal   Bevacizumab (AVASTIN) SOLN 1.25 mg Intravitreal   REVIEW OF SYSTEMS: ROS   Positive for: Gastrointestinal, Eyes Negative for: Constitutional, Neurological, Skin, Genitourinary, Musculoskeletal, HENT, Endocrine, Cardiovascular, Respiratory, Psychiatric, Allergic/Imm, Heme/Lymph Last edited by Etheleen Mayhew, COT on 02/03/2023  8:30 AM.  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   Headache    Hypercholesterolemia    Macular degeneration    Wet OU   Wears glasses    Past Surgical History:  Procedure Laterality Date   25 GAUGE PARS PLANA VITRECTOMY WITH 20 GAUGE MVR PORT FOR MACULAR HOLE Right 04/07/2019   Procedure: 25 GAUGE PARS PLANA VITRECTOMY WITH 20 GAUGE MVR PORT FOR MACULAR  HOLE;  Surgeon: Rennis Chris, MD;  Location: [facility] OR;  Service: Ophthalmology;  Laterality: Right;   CATARACT EXTRACTION Bilateral    2013   DILATION AND CURETTAGE OF UTERUS     EYE SURGERY Bilateral 2013   Cat Sx   GAS/FLUID EXCHANGE Right 04/07/2019   Procedure: Gas/Fluid Exchange;  Surgeon: Rennis Chris, MD;  Location: [facility] OR;  Service: Ophthalmology;  Laterality: Right;   MEMBRANE PEEL Right 04/07/2019   Procedure: Eula Flax;  Surgeon: Rennis Chris, MD;  Location: [facility] OR;  Service: Ophthalmology;  Laterality: Right;   PHOTOCOAGULATION WITH LASER Right 04/07/2019   Procedure: Photocoagulation With Laser;  Surgeon: Rennis Chris, MD;  Location: [facility] OR;  Service: Ophthalmology;  Laterality: Right;   YAG LASER APPLICATION Bilateral    FAMILY HISTORY Family History  Problem Relation Age of Onset   Stroke Father    SOCIAL HISTORY Social History   Tobacco Use   Smoking status: Never   Smokeless tobacco: Never  Vaping Use   Vaping Use: Never used  Substance Use Topics   Alcohol use: No   Drug use: No       OPHTHALMIC EXAM: Base Eye Exam     Visual Acuity (Snellen - Linear)       Right Left   Dist Cunningham 20/80 20/70         Tonometry (Tonopen, 8:35 AM)       Right Left   Pressure 14 14         Pupils       Dark Light Shape React APD   Right 3 2 Round Brisk None   Left 3 2 Round Brisk None         Visual Fields       Left Right    Full Full         Extraocular Movement       Right Left    Full, Ortho Full, Ortho         Neuro/Psych     Oriented x3: Yes   Mood/Affect: Normal         Dilation     Both eyes: 2.5% Phenylephrine @ 8:35 AM           Slit Lamp and Fundus Exam     Slit Lamp Exam       Right Left   Lids/Lashes Dermatochalasis - upper lid, periorbital edema Dermatochalasis - upper lid   Conjunctiva/Sclera White and quiet White and quiet   Cornea Arcus, 1+ fine Punctate epithelial erosions, mild endo pigment Arcus, 3+  Punctate epithelial erosions, mild tear film debris, mild endo pigment, dry tear fil, decreased TBUT   Anterior Chamber Deep, quiet Deep and quiet   Iris Round and dilated Round and dilated   Lens PC IOL in good postion with anterior capsular phimosis, 1+ Posterior capsular opacification PC IOL in good postion with anterior capsular phimosis, 1+ Posterior capsular opacification  sparing center   Anterior Vitreous post vitrectomy Vitreous syneresis         Fundus Exam       Right Left   Disc Sharp rim, mild pallor, peripapillary drusen, PPP Pink and Sharp, central pallor, +cupping, +PPP, peripapillary drusen   C/D Ratio 0.5 0.75   Macula flat; ERM/VMT gone, blunted foveal reflex, refractile drusen, trace cystic changes - stably improved, pigment clumping, focal IRH centrally - resolved, +atrophy Blunted foveal reflex, refractile Drusen, early Atrophy, RPE mottling and clumping, +PEDs/CNV, +cystic changes -- stably improved, no heme   Vessels attenuated, mild tortuosity attenuated, mild tortuosity   Periphery Attached, scattered peripheral drusen, peripheral laser changes superiorly Attached with peripheral drusen           IMAGING AND PROCEDURES  Imaging and Procedures for 02/09/18  OCT, Retina - OU - Both Eyes       Right Eye Quality was good. Central Foveal Thickness: 241. Progression has been stable. Findings include no IRF, no SRF, abnormal foveal contour, retinal drusen , subretinal hyper-reflective material, intraretinal hyper-reflective material, pigment epithelial detachment, outer retinal atrophy (stable improvement in IRF and SRHM temporal macula ).   Left Eye Quality was good. Central Foveal Thickness: 247. Progression has been stable. Findings include no SRF, abnormal foveal contour, retinal drusen , subretinal hyper-reflective material, epiretinal membrane, intraretinal fluid, pigment epithelial detachment, vitreous traction, outer retinal atrophy (Stable improvement in  IRF inferior macula, persistent VMT w/ cystic changes, stable improvement in low PED / [facility] inferior macula).   Notes Images taken, stored on drive  Diagnosis / Impression:  OD: Exudative ARMD -- stable improvement in IRF and SRHM temporal macula  OS: exudative ARMD -- Stable improvement in IRF inferior macula, persistent VMT w/ cystic changes, stable improvement in low PED / [facility] inferior macula  Clinical management:  See below  Abbreviations: NFP - Normal foveal profile. CME - cystoid macular edema. PED - pigment epithelial detachment. IRF - intraretinal fluid. SRF - subretinal fluid. EZ - ellipsoid zone. ERM - epiretinal membrane. ORA - outer retinal atrophy. ORT - outer retinal tubulation. SRHM - subretinal hyper-reflective material       Intravitreal Injection, Pharmacologic Agent - OD - Right Eye       Time Out 02/03/2023. 9:00 AM. Confirmed correct patient, procedure, site, and patient consented.   Anesthesia Topical anesthesia was used. Anesthetic medications included Lidocaine 2%, Proparacaine 0.5%.   Procedure Preparation included 5% betadine to ocular surface, eyelid speculum. A (32g) needle was used.   Injection: 2 mg aflibercept 2 MG/0.05ML   Route: Intravitreal, Site: Right Eye   NDC: L6038910, Lot: [id number], Expiration date: 02/10/2024, Waste: 0 mL   Post-op Post injection exam found visual acuity of at least counting fingers. The patient tolerated the procedure well. There were no complications. The patient received written and verbal post procedure care education. Post injection medications were not given.            ASSESSMENT/PLAN:    ICD-10-CM   1. Exudative age-related macular degeneration of right eye with active choroidal neovascularization  H35.3211 OCT, Retina - OU - Both Eyes    Intravitreal Injection, Pharmacologic Agent - OD - Right Eye    aflibercept (EYLEA) SOLN 2 mg    2. Exudative age-related macular degeneration of left eye with  active choroidal neovascularization  H35.3221     3. 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 Pseudophakia of both eyes  Z96.1  1. 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  - S/P IVA OD #1 (12.03.18), #2 (01.02.19), #3 (01.30.19), #4 (03.01.19), #5 (04.01.19), #6 (04.30.19), #7 (06.05.19), #8 (07.19.19), #9 (08.30.19), #10 (10.14.19), #11 (11.18.19), #12 (12.30.19), #13 (02.10.20), #14 (04.06.20), #15 (09.17.20), #16 (10.15.20), #17 (12.01.20), #18 (01.18.21), #19 (03.15.21), #20 (5.11.21), #21 (07.20.21), #22 (10.14.21), #23 (01.10.22), #24 (03.07.22), #24 (05.23.22), #25 (9.1.22), #26 (11.01.22), #27 (12.28.22), # 28 (02.14.23), #29 (03.28.23) -- IVA resistance  - s/p IVE OD #1 (05.09.23) sample, #2 (06.14.23), #3 (07.12.23), #4 (08.22.23), #5 (10.03.23)., #6 (11.21.23), #7 (01.09.24), #8 (02.27.24)  - FA (04.01.19) shows mild CNVM OD consistent with exudative ARMD  - OCT shows OD: stable improvement in IRF and SRHM temporal macula at 8 wks  - BCVA OD 20/80 - decreased  - recommend IVE OD #9 today, 04.23.24 w/ f/u in 8 wks  - pt wishes to proceed with Eylea  - RBA of procedure discussed, questions answered  - see procedure note  - Eylea informed consent form signed and scanned on 05.09.23 (OU)  - f/u 8 weeks -- DFE/OCT/possible injection  2. Age related macular degeneration, exudative, OS  - interval conversion from non-exudative to exudative noted 07.20.21  - s/p IVA OS #1 (07.20.21), #2 (08.19.21), #3 (09.16.21), #4 (10.14.21), #5 (11.29.21), #6 (01.10.22), #7 (03.07.22), #8 (05.23.22), #9 (09.01.22), #10 (03.28.23), #11 (05.09.23), #12 (06.14.23), #13 (07.12.23), #14 (08.22.23), #15 (10.03.23), #16 (11.21.23), #17 (01.09.24), #18 (02.27.24)  - exam shows no heme  - OCT with OS: Stable improvement in IRF inferior macula, persistent VMT w/ cystic changes, mild interval [facility] at [facility] wks  -  BCVA OS 20/70 - stable  - recommend holding IVA OS today  - pt in agreement - IVA informed consent obtained, signed and scanned, 07.20.21 - f/u 8 weeks, DFE, OCT  3. 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 Pseudophakia OU  - s/p CE/IOL OU  - s/p YAG OU  - beautiful surgeries, doing well  - continue to monitor  Ophthalmic Meds Ordered this visit:  Meds ordered this encounter  Medications   aflibercept (EYLEA) SOLN 2 mg     Return in about 8 weeks (around 03/31/2023) for f/u exu ARMD OU, DFE, OCT.  There are no Patient Instructions on file for this visit.  This document serves as a record of services personally performed by Karie ChimeraBrian G. Amberli Ruegg, MD, PhD. It was created on their behalf by Gerilyn Nestlehristine Shamlian, COT an ophthalmic technician. The creation of this record is the provider's dictation and/or activities during the visit.    Electronically signed by:  Gerilyn Nestlehristine Shamlian, COT  04.09.24 5:20 PM   This document serves as a record of services personally performed by Karie ChimeraBrian G. Brynnleigh Mcelwee, MD, PhD. It was created on their behalf by Glee ArvinAmanda J. Manson PasseyBrown, OA an ophthalmic technician. The creation of this record is the provider's dictation  and/or activities during the visit.    Electronically signed by: Glee Arvin. Manson Passey, [place] 04.23.2024 5:20 PM  Karie Chimera, M.D., Ph.D. Diseases & Surgery of the Retina and Vitreous [facility]  I have reviewed the above documentation for accuracy and completeness, and I agree with the above. Karie Chimera, M.D., Ph.D. 02/03/23 5:21 PM   Abbreviations: M myopia (nearsighted); A astigmatism; H hyperopia  (farsighted); P presbyopia; Mrx spectacle prescription;  CTL contact lenses; OD right eye; OS left eye; OU both eyes  XT exotropia; ET esotropia; PEK punctate epithelial keratitis; PEE punctate epithelial erosions; DES dry eye syndrome; MGD meibomian gland dysfunction; ATs artificial tears; PFAT's preservative free artificial tears; NSC nuclear sclerotic cataract; PSC posterior subcapsular cataract; ERM epi-retinal membrane; PVD posterior vitreous detachment; RD retinal detachment; DM diabetes mellitus; DR diabetic retinopathy; NPDR non-proliferative diabetic retinopathy; PDR proliferative diabetic retinopathy; CSME clinically significant macular edema; DME diabetic macular edema; dbh dot blot hemorrhages; CWS cotton wool spot; POAG primary open angle glaucoma; C/D cup-to-disc ratio; HVF humphrey visual field; GVF goldmann visual field; OCT optical coherence tomography; IOP intraocular pressure; BRVO Branch retinal vein occlusion; CRVO central retinal vein occlusion; CRAO central retinal artery occlusion; BRAO branch retinal artery occlusion; RT retinal tear; SB scleral buckle; PPV pars plana vitrectomy; VH Vitreous hemorrhage; PRP panretinal laser photocoagulation; IVK intravitreal kenalog; VMT vitreomacular traction; MH Macular hole;  NVD neovascularization of the disc; NVE neovascularization elsewhere; AREDS age related eye disease study; ARMD age related macular degeneration; POAG primary open angle glaucoma; EBMD epithelial/anterior basement membrane dystrophy; ACIOL anterior chamber intraocular lens; IOL intraocular lens; PCIOL posterior chamber intraocular lens; Phaco/IOL phacoemulsification with intraocular lens placement; PRK photorefractive keratectomy; LASIK laser assisted in situ keratomileusis; HTN hypertension; DM diabetes mellitus; COPD chronic obstructive pulmonary disease

## 2023-02-01 ENCOUNTER — Other Ambulatory Visit: Payer: Self-pay | Admitting: Cardiology

## 2023-02-01 DIAGNOSIS — I10 Essential (primary) hypertension: Secondary | ICD-10-CM

## 2023-02-03 ENCOUNTER — Encounter (INDEPENDENT_AMBULATORY_CARE_PROVIDER_SITE_OTHER): Payer: Self-pay | Admitting: Ophthalmology

## 2023-02-03 ENCOUNTER — Ambulatory Visit (INDEPENDENT_AMBULATORY_CARE_PROVIDER_SITE_OTHER): Payer: Medicare Other | Admitting: Ophthalmology

## 2023-02-03 DIAGNOSIS — Z961 Presence of intraocular lens: Secondary | ICD-10-CM

## 2023-02-03 DIAGNOSIS — H43823 Vitreomacular adhesion, bilateral: Secondary | ICD-10-CM

## 2023-02-03 DIAGNOSIS — H353221 Exudative age-related macular degeneration, left eye, with active choroidal neovascularization: Secondary | ICD-10-CM

## 2023-02-03 DIAGNOSIS — H353231 Exudative age-related macular degeneration, bilateral, with active choroidal neovascularization: Secondary | ICD-10-CM | POA: Diagnosis not present

## 2023-02-03 DIAGNOSIS — H353211 Exudative age-related macular degeneration, right eye, with active choroidal neovascularization: Secondary | ICD-10-CM

## 2023-02-03 MED ORDER — AFLIBERCEPT 2MG/0.05ML IZ SOLN FOR KALEIDOSCOPE
2.0000 mg | INTRAVITREAL | Status: AC | PRN
Start: 2023-02-03 — End: 2023-02-03
  Administered 2023-02-03: 2 mg via INTRAVITREAL

## 2023-03-06 ENCOUNTER — Other Ambulatory Visit: Payer: Self-pay | Admitting: Cardiology

## 2023-03-06 DIAGNOSIS — I10 Essential (primary) hypertension: Secondary | ICD-10-CM

## 2023-03-25 NOTE — Progress Notes (Signed)
[facility] - Clinic Note  03/30/2023     CHIEF COMPLAINT Patient presents for Retina Follow Up    HISTORY OF PRESENT ILLNESS: Lindsay Harvey is a 87 y.o. 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  This started 8 weeks ago.  I, the attending physician,  performed the HPI with the patient and updated documentation appropriately.        Comments   Patient here for 8 weeks retina follow up for exu ARMD OU. Patient states vision is ok. No eye pain. Lids feel heavy like after crying. Has floaters flying around. Had some eye pain after last injection.       Last edited by Rennis Chris, MD on 03/30/2023  8:41 AM.    Pt states vision is "okay", she feels like she has more floaters since her last injection   Referring physician: Ralene Ok, MD [address] [facility] [address] [place],  [place] [address]  HISTORICAL INFORMATION:   Selected notes from the MEDICAL RECORD NUMBER Referred by Dr. Marcha Solders for concern of AMD OU, CNV   CURRENT MEDICATIONS: Current Outpatient Medications (Ophthalmic Drugs)  Medication Sig   carboxymethylcellulose (REFRESH PLUS) 0.5 % SOLN Place 1 drop into both eyes 3 (three) times daily as needed (dry/irritated eyes).    prednisoLONE acetate (PRED FORTE) 1 % ophthalmic suspension Place 1 drop into the right eye 4 (four) times daily.   REFRESH OPTIVE MEGA-3 0.5-1-0.5 % SOLN Place 1 drop into both eyes 3 (three) times daily as needed (discomfort/eye irritation.).   No current facility-administered medications for this visit. (Ophthalmic Drugs)   Current Outpatient Medications (Other)  Medication Sig   amLODipine (NORVASC) 5 MG tablet TAKE 1 TABLET BY MOUTH DAILY   aspirin EC 81 MG tablet Take 1 tablet (81 mg total) by mouth daily. Swallow whole.   Cholecalciferol (VITAMIN D3) 50 MCG (2000 UT) TABS Take 2,000 Units by mouth daily.   simvastatin (ZOCOR) 40 MG tablet Take  20 mg by mouth every evening. 1/4 tablet at night   calcium carbonate (OSCAL) 1500 (600 Ca) MG TABS tablet Take 600 mg of elemental calcium by mouth 2 (two) times a day.   nitroGLYCERIN (NITROSTAT) 0.4 MG SL tablet Place 1 tablet (0.4 mg total) under the tongue every 5 (five) minutes as needed for chest pain.   Current Facility-Administered Medications (Other)  Medication Route   Bevacizumab (AVASTIN) SOLN 1.25 mg Intravitreal   Bevacizumab (AVASTIN) SOLN 1.25 mg Intravitreal   Bevacizumab (AVASTIN) SOLN 1.25 mg Intravitreal   Bevacizumab (AVASTIN) SOLN 1.25 mg Intravitreal   Bevacizumab (AVASTIN) SOLN 1.25 mg Intravitreal   Bevacizumab (AVASTIN) SOLN 1.25 mg Intravitreal   Bevacizumab (AVASTIN) SOLN 1.25 mg Intravitreal   Bevacizumab (AVASTIN) SOLN 1.25 mg Intravitreal   Bevacizumab (AVASTIN) SOLN 1.25 mg Intravitreal   Bevacizumab (AVASTIN) SOLN 1.25 mg Intravitreal   Bevacizumab (AVASTIN) SOLN 1.25 mg Intravitreal   Bevacizumab (AVASTIN) SOLN 1.25 mg Intravitreal   Bevacizumab (AVASTIN) SOLN 1.25 mg Intravitreal   REVIEW OF SYSTEMS: ROS   Positive for: Gastrointestinal, Eyes Negative for: Constitutional, Neurological, Skin, Genitourinary, Musculoskeletal, HENT, Endocrine, Cardiovascular, Respiratory, Psychiatric, Allergic/Imm, Heme/Lymph Last edited by Laddie Aquas, COA on 03/30/2023  7:58 AM.     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   Headache    Hypercholesterolemia    Macular degeneration    Wet OU   Wears glasses    Past Surgical History:  Procedure Laterality Date   25 GAUGE PARS PLANA VITRECTOMY WITH 20 GAUGE MVR PORT FOR MACULAR HOLE Right 04/07/2019   Procedure: 25 GAUGE PARS PLANA VITRECTOMY WITH 20 GAUGE MVR PORT FOR MACULAR HOLE;  Surgeon: Rennis Chris, MD;  Location: [facility] OR;  Service: Ophthalmology;  Laterality: Right;    CATARACT EXTRACTION Bilateral    2013   DILATION AND CURETTAGE OF UTERUS     EYE SURGERY Bilateral 2013   Cat Sx   GAS/FLUID EXCHANGE Right 04/07/2019   Procedure: Gas/Fluid Exchange;  Surgeon: Rennis Chris, MD;  Location: [facility] OR;  Service: Ophthalmology;  Laterality: Right;   MEMBRANE PEEL Right 04/07/2019   Procedure: Eula Flax;  Surgeon: Rennis Chris, MD;  Location: [facility] OR;  Service: Ophthalmology;  Laterality: Right;   PHOTOCOAGULATION WITH LASER Right 04/07/2019   Procedure: Photocoagulation With Laser;  Surgeon: Rennis Chris, MD;  Location: [facility] OR;  Service: Ophthalmology;  Laterality: Right;   YAG LASER APPLICATION Bilateral    FAMILY HISTORY Family History  Problem Relation Age of Onset   Stroke Father    SOCIAL HISTORY Social History   Tobacco Use   Smoking status: Never   Smokeless tobacco: Never  Vaping Use   Vaping Use: Never used  Substance Use Topics   Alcohol use: No   Drug use: No       OPHTHALMIC EXAM: Base Eye Exam     Visual Acuity (Snellen - Linear)       Right Left   Dist [place] 20/70 20/60   Dist ph [place] NI NI         Tonometry (Tonopen, 7:56 AM)       Right Left   Pressure 09 11         Pupils       Dark Light Shape React APD   Right 3 2 Round Brisk None   Left 3 2 Round Brisk None         Visual Fields (Counting fingers)       Left Right    Full Full         Extraocular Movement       Right Left    Full, Ortho Full, Ortho         Neuro/Psych     Oriented x3: Yes   Mood/Affect: Normal         Dilation     Both eyes: 1.0% Mydriacyl, 2.5% Phenylephrine @ 7:56 AM           Slit Lamp and Fundus Exam     Slit Lamp Exam       Right Left   Lids/Lashes Dermatochalasis - upper lid, periorbital edema Dermatochalasis - upper lid   Conjunctiva/Sclera White and quiet White and quiet   Cornea Arcus, 1+ fine Punctate epithelial erosions, mild endo pigment Arcus, 3+ Punctate epithelial erosions, mild tear film  debris, mild endo pigment, dry tear fil, decreased TBUT   Anterior Chamber Deep, quiet Deep and quiet   Iris Round and dilated Round and dilated   Lens PC IOL in good postion with anterior capsular phimosis, 1+ Posterior capsular opacification PC IOL in good postion with anterior capsular phimosis, 1+ Posterior capsular opacification sparing center   Anterior Vitreous post vitrectomy Vitreous syneresis  Fundus Exam       Right Left   Disc Sharp rim, mild pallor, peripapillary drusen, PPP Pink and Sharp, central pallor, +cupping, +PPP, peripapillary drusen   C/D Ratio 0.5 0.75   Macula flat; ERM/VMT gone, blunted foveal reflex, refractile drusen, trace cystic changes -- stably improved, pigment clumping, focal IRH centrally - resolved, +atrophy Blunted foveal reflex, refractile Drusen, early Atrophy, RPE mottling and clumping, +PEDs/CNV, +cystic changes -- stably improved, no heme   Vessels attenuated, mild tortuosity attenuated, mild tortuosity   Periphery Attached, scattered peripheral drusen, peripheral laser changes superiorly Attached with peripheral drusen           IMAGING AND PROCEDURES  Imaging and Procedures for 02/09/18  OCT, Retina - OU - Both Eyes       Right Eye Quality was good. Central Foveal Thickness: 241. Progression has improved. Findings include no IRF, no SRF, abnormal foveal contour, retinal drusen , subretinal hyper-reflective material, intraretinal hyper-reflective material, pigment epithelial detachment, outer retinal atrophy (stable improvement in IRF and SRHM temporal macula, trace SRF -- slightly improved).   Left Eye Quality was good. Central Foveal Thickness: 246. Progression has been stable. Findings include no SRF, abnormal foveal contour, retinal drusen , subretinal hyper-reflective material, epiretinal membrane, intraretinal fluid, pigment epithelial detachment, vitreous traction, outer retinal atrophy (Stable improvement in IRF inferior  macula, persistent VMT w/ cystic changes, stable improvement in low PED / [facility] inferior macula).   Notes Images taken, stored on drive  Diagnosis / Impression:  OD: Exudative ARMD -- stable improvement in IRF and SRHM temporal macula, trace SRF -- slightly improved OS: exudative ARMD -- Stable improvement in IRF inferior macula, persistent VMT w/ cystic changes, stable improvement in low PED / [facility] inferior macula  Clinical management:  See below  Abbreviations: NFP - Normal foveal profile. CME - cystoid macular edema. PED - pigment epithelial detachment. IRF - intraretinal fluid. SRF - subretinal fluid. EZ - ellipsoid zone. ERM - epiretinal membrane. ORA - outer retinal atrophy. ORT - outer retinal tubulation. SRHM - subretinal hyper-reflective material       Intravitreal Injection, Pharmacologic Agent - OD - Right Eye       Time Out 03/30/2023. 8:30 AM. Confirmed correct patient, procedure, site, and patient consented.   Anesthesia Topical anesthesia was used. Anesthetic medications included Lidocaine 2%, Proparacaine 0.5%.   Procedure Preparation included 5% betadine to ocular surface, eyelid speculum. A (32g) needle was used.   Injection: 2 mg aflibercept 2 MG/0.05ML   Route: Intravitreal, Site: Right Eye   NDC: L6038910, Lot: [id number], Expiration date: 02/10/2024, Waste: 0 mL   Post-op Post injection exam found visual acuity of at least counting fingers. The patient tolerated the procedure well. There were no complications. The patient received written and verbal post procedure care education. Post injection medications were not given.            ASSESSMENT/PLAN:    ICD-10-CM   1. 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 Exudative age-related macular  degeneration of left eye with active choroidal neovascularization (HCC)  H35.3221     3. 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 Pseudophakia of both eyes  Z96.1       1. Exudative age related macular degeneration  with active choroidal neovascularization OD  - S/P IVA OD #1 (12.03.18), #2 (01.02.19), #3 (01.30.19), #4 (03.01.19), #5 (04.01.19), #6 (04.30.19), #7 (06.05.19), #8 (07.19.19), #9 (08.30.19), #10 (10.14.19), #11 (11.18.19), #12 (12.30.19), #13 (02.10.20), #14 (04.06.20), #15 (09.17.20), #16 (10.15.20), #17 (12.01.20), #18 (01.18.21), #19 (03.15.21), #20 (5.11.21), #21 (07.20.21), #22 (10.14.21), #23 (01.10.22), #24 (03.07.22), #24 (05.23.22), #25 (9.1.22), #26 (11.01.22), #27 (12.28.22), # 28 (02.14.23), #29 (03.28.23) -- IVA resistance  - s/p IVE OD #1 (05.09.23) sample, #2 (06.14.23), #3 (07.12.23), #4 (08.22.23), #5 (10.03.23)., #6 (11.21.23), #7 (01.09.24), #8 (02.27.24), #9 (04.23.24)  - FA (04.01.19) shows mild CNVM OD consistent with exudative ARMD  - OCT shows OD: stable improvement in IRF and SRHM temporal macula; tr SRF -- slightly improved at 8 wks  - BCVA OD 20/70 -- improved  - recommend IVE OD #10 today, 06.17.24 w/ f/u in 8 wks  - pt wishes to proceed with Eylea  - RBA of procedure discussed, questions answered  - see procedure note  - Eylea informed consent form signed and scanned on 05.09.23 (OU)  - f/u 8 weeks -- DFE/OCT/possible injection  2. Age related macular degeneration, exudative, OS  - interval conversion from non-exudative to exudative noted 07.20.21  - s/p IVA OS #1 (07.20.21), #2 (08.19.21), #3 (09.16.21), #4 (10.14.21), #5 (11.29.21), #6 (01.10.22), #7 (03.07.22), #8 (05.23.22), #9 (09.01.22), #10 (03.28.23), #11 (05.09.23), #12 (06.14.23), #13 (07.12.23), #14 (08.22.23), #15 (10.03.23), #16 (11.21.23), #17 (01.09.24), #18 (02.27.24)  - exam shows no heme  - OCT with OD: stable improvement in IRF and SRHM temporal macula, trace SRF --  slightly improved; OS: stable improvement in IRF inferior macula, persistent VMT w/ cystic changes, stable improvement in low PED / [facility] inferior macula at ~4 mos since last IVA  - BCVA OS 20/60 -- improved  - recommend holding IVA OS again today  - pt in agreement - IVA informed consent obtained, signed and scanned, 07.20.21 - f/u 8 weeks DFE, OCT  3. 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 Pseudophakia OU  - s/p CE/IOL OU  - s/p YAG OU  - beautiful surgeries, doing well  - continue to monitor OU  Ophthalmic Meds Ordered this visit:  Meds ordered this encounter  Medications   aflibercept (EYLEA) SOLN 2 mg     Return in about 8 weeks (around 05/25/2023) for f/u exu ARMD OD, DFE, OCT.  There are no Patient Instructions on file for this visit.  This document serves as a record of services personally performed by Karie Chimera, MD, PhD. It was created on their behalf by Annalee Genta, COMT. The creation of this record is the provider's dictation and/or activities during the visit.  Electronically signed by: Annalee Genta, COMT 03/30/23 11:55 AM  This document serves as a record of services personally performed by Karie Chimera, MD, PhD. It was created on their behalf by Glee Arvin. Manson Passey, OA an ophthalmic technician. The  creation of this record is the provider's dictation and/or activities during the visit.    Electronically signed by: Glee Arvin. Manson Passey, [place] 06.17.2024 11:55 AM  Karie Chimera, M.D., Ph.D. Diseases & Surgery of the Retina and Vitreous [facility]  I have reviewed the above documentation for accuracy and completeness,  and I agree with the above. Karie Chimera, M.D., Ph.D. 03/30/23 12:10 PM    Abbreviations: M myopia (nearsighted); A astigmatism; H hyperopia (farsighted); P presbyopia; Mrx spectacle prescription;  CTL contact lenses; OD right eye; OS left eye; OU both eyes  XT exotropia; ET esotropia; PEK punctate epithelial keratitis; PEE punctate epithelial erosions; DES dry eye syndrome; MGD meibomian gland dysfunction; ATs artificial tears; PFAT's preservative free artificial tears; NSC nuclear sclerotic cataract; PSC posterior subcapsular cataract; ERM epi-retinal membrane; PVD posterior vitreous detachment; RD retinal detachment; DM diabetes mellitus; DR diabetic retinopathy; NPDR non-proliferative diabetic retinopathy; PDR proliferative diabetic retinopathy; CSME clinically significant macular edema; DME diabetic macular edema; dbh dot blot hemorrhages; CWS cotton wool spot; POAG primary open angle glaucoma; C/D cup-to-disc ratio; HVF humphrey visual field; GVF goldmann visual field; OCT optical coherence tomography; IOP intraocular pressure; BRVO Branch retinal vein occlusion; CRVO central retinal vein occlusion; CRAO central retinal artery occlusion; BRAO branch retinal artery occlusion; RT retinal tear; SB scleral buckle; PPV pars plana vitrectomy; VH Vitreous hemorrhage; PRP panretinal laser photocoagulation; IVK intravitreal kenalog; VMT vitreomacular traction; MH Macular hole;  NVD neovascularization of the disc; NVE neovascularization elsewhere; AREDS age related eye disease study; ARMD age related macular degeneration; POAG primary open angle glaucoma; EBMD epithelial/anterior basement membrane dystrophy; ACIOL anterior chamber intraocular lens; IOL intraocular lens; PCIOL posterior chamber intraocular lens; Phaco/IOL phacoemulsification with intraocular lens placement; PRK photorefractive keratectomy; LASIK laser assisted in situ keratomileusis; HTN hypertension; DM diabetes mellitus; COPD chronic  obstructive pulmonary disease

## 2023-03-30 ENCOUNTER — Ambulatory Visit (INDEPENDENT_AMBULATORY_CARE_PROVIDER_SITE_OTHER): Payer: Medicare Other | Admitting: Ophthalmology

## 2023-03-30 ENCOUNTER — Encounter (INDEPENDENT_AMBULATORY_CARE_PROVIDER_SITE_OTHER): Payer: Self-pay | Admitting: Ophthalmology

## 2023-03-30 DIAGNOSIS — H353231 Exudative age-related macular degeneration, bilateral, with active choroidal neovascularization: Secondary | ICD-10-CM

## 2023-03-30 DIAGNOSIS — H353221 Exudative age-related macular degeneration, left eye, with active choroidal neovascularization: Secondary | ICD-10-CM

## 2023-03-30 DIAGNOSIS — H43823 Vitreomacular adhesion, bilateral: Secondary | ICD-10-CM

## 2023-03-30 DIAGNOSIS — H353211 Exudative age-related macular degeneration, right eye, with active choroidal neovascularization: Secondary | ICD-10-CM

## 2023-03-30 DIAGNOSIS — Z961 Presence of intraocular lens: Secondary | ICD-10-CM | POA: Diagnosis not present

## 2023-03-30 MED ORDER — AFLIBERCEPT 2MG/0.05ML IZ SOLN FOR KALEIDOSCOPE
2.0000 mg | INTRAVITREAL | Status: AC | PRN
Start: 2023-03-30 — End: 2023-03-30
  Administered 2023-03-30: 2 mg via INTRAVITREAL

## 2023-05-21 NOTE — Progress Notes (Signed)
[facility] - Clinic Note  05/25/2023     CHIEF COMPLAINT Patient presents for Retina Follow Up    HISTORY OF PRESENT ILLNESS: Lindsay Harvey is a 87 y.o. female who presents to the clinic today for:   HPI     Retina Follow Up   Patient presents with  Wet AMD.  In right eye.  This started 8 weeks ago.  Duration of 8 weeks.  Since onset it is stable.  I, the attending physician,  performed the HPI with the patient and updated documentation appropriately.        Comments   8 week retina follow up ARMD and IVE OD pt is reporting no vision changes noticed she denies any flashes or floaters       Last edited by Rennis Chris, MD on 05/25/2023  8:33 AM.    Pt states vision is stable   Referring physician: Ralene Ok, MD [address] [place],  [place] [address]  HISTORICAL INFORMATION:   Selected notes from the MEDICAL RECORD NUMBER Referred by Dr. Marcha Solders for concern of AMD OU, CNV   CURRENT MEDICATIONS: Current Outpatient Medications (Ophthalmic Drugs)  Medication Sig   carboxymethylcellulose (REFRESH PLUS) 0.5 % SOLN Place 1 drop into both eyes 3 (three) times daily as needed (dry/irritated eyes).    prednisoLONE acetate (PRED FORTE) 1 % ophthalmic suspension Place 1 drop into the right eye 4 (four) times daily.   REFRESH OPTIVE MEGA-3 0.5-1-0.5 % SOLN Place 1 drop into both eyes 3 (three) times daily as needed (discomfort/eye irritation.).   No current facility-administered medications for this visit. (Ophthalmic Drugs)   Current Outpatient Medications (Other)  Medication Sig   amLODipine (NORVASC) 5 MG tablet TAKE 1 TABLET BY MOUTH DAILY   aspirin EC 81 MG tablet Take 1 tablet (81 mg total) by mouth daily. Swallow whole.   calcium carbonate (OSCAL) 1500 (600 Ca) MG TABS tablet Take 600 mg of elemental calcium by mouth 2 (two) times a day.   Cholecalciferol (VITAMIN D3) 50 MCG (2000 UT) TABS Take 2,000 Units by mouth daily.   nitroGLYCERIN  (NITROSTAT) 0.4 MG SL tablet Place 1 tablet (0.4 mg total) under the tongue every 5 (five) minutes as needed for chest pain.   simvastatin (ZOCOR) 40 MG tablet Take 20 mg by mouth every evening. 1/4 tablet at night   Current Facility-Administered Medications (Other)  Medication Route   Bevacizumab (AVASTIN) SOLN 1.25 mg Intravitreal   Bevacizumab (AVASTIN) SOLN 1.25 mg Intravitreal   Bevacizumab (AVASTIN) SOLN 1.25 mg Intravitreal   Bevacizumab (AVASTIN) SOLN 1.25 mg Intravitreal   Bevacizumab (AVASTIN) SOLN 1.25 mg Intravitreal   Bevacizumab (AVASTIN) SOLN 1.25 mg Intravitreal   Bevacizumab (AVASTIN) SOLN 1.25 mg Intravitreal   Bevacizumab (AVASTIN) SOLN 1.25 mg Intravitreal   Bevacizumab (AVASTIN) SOLN 1.25 mg Intravitreal   Bevacizumab (AVASTIN) SOLN 1.25 mg Intravitreal   Bevacizumab (AVASTIN) SOLN 1.25 mg Intravitreal   Bevacizumab (AVASTIN) SOLN 1.25 mg Intravitreal   Bevacizumab (AVASTIN) SOLN 1.25 mg Intravitreal   REVIEW OF SYSTEMS: ROS   Positive for: Gastrointestinal, Eyes Negative for: Constitutional, Neurological, Skin, Genitourinary, Musculoskeletal, HENT, Endocrine, Cardiovascular, Respiratory, Psychiatric, Allergic/Imm, Heme/Lymph Last edited by Etheleen Mayhew, COT on 05/25/2023  7:59 AM.      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   Headache  Hypercholesterolemia    Macular degeneration    Wet OU   Wears glasses    Past Surgical History:  Procedure Laterality Date   25 GAUGE PARS PLANA VITRECTOMY WITH 20 GAUGE MVR PORT FOR MACULAR HOLE Right 04/07/2019   Procedure: 25 GAUGE PARS PLANA VITRECTOMY WITH 20 GAUGE MVR PORT FOR MACULAR HOLE;  Surgeon: Rennis Chris, MD;  Location: [facility] OR;  Service: Ophthalmology;  Laterality: Right;   CATARACT EXTRACTION Bilateral    2013   DILATION AND CURETTAGE OF UTERUS     EYE SURGERY  Bilateral 2013   Cat Sx   GAS/FLUID EXCHANGE Right 04/07/2019   Procedure: Gas/Fluid Exchange;  Surgeon: Rennis Chris, MD;  Location[facility] OR;  Service: Ophthalmology;  Laterality: Right;   MEMBRANE PEEL Right 04/07/2019   Procedure: Eula Flax;  Surgeon: Rennis Chris, MD;  Location: [facility] OR;  Service: Ophthalmology;  Laterality: Right;   PHOTOCOAGULATION WITH LASER Right 04/07/2019   Procedure: Photocoagulation With Laser;  Surgeon: Rennis Chris, MD;  Location: [facility] OR;  Service: Ophthalmology;  Laterality: Right;   YAG LASER APPLICATION Bilateral    FAMILY HISTORY Family History  Problem Relation Age of Onset   Stroke Father    SOCIAL HISTORY Social History   Tobacco Use   Smoking status: Never   Smokeless tobacco: Never  Vaping Use   Vaping status: Never Used  Substance Use Topics   Alcohol use: No   Drug use: No       OPHTHALMIC EXAM: Base Eye Exam     Visual Acuity (Snellen - Linear)       Right Left   Dist [place] 20/80 -2 20/70 -1   Dist ph [place] NI NI         Tonometry (Tonopen, 8:03 AM)       Right Left   Pressure 8 10         Pupils       Pupils Dark Light Shape React APD   Right PERRL 3 2 Round Brisk None   Left PERRL 3 2 Round Brisk None         Visual Fields       Left Right    Full Full         Extraocular Movement       Right Left    Full, Ortho Full, Ortho         Neuro/Psych     Oriented x3: Yes   Mood/Affect: Normal         Dilation     Both eyes: 2.5% Phenylephrine @ 8:02 AM           Slit Lamp and Fundus Exam     Slit Lamp Exam       Right Left   Lids/Lashes Dermatochalasis - upper lid, periorbital edema Dermatochalasis - upper lid   Conjunctiva/Sclera White and quiet White and quiet   Cornea Arcus, 1+ fine Punctate epithelial erosions, mild endo pigment Arcus, 3+ Punctate epithelial erosions, mild tear film debris, mild endo pigment, dry tear fil, decreased TBUT   Anterior Chamber Deep, quiet Deep and quiet    Iris Round and dilated Round and dilated   Lens PC IOL in good postion with anterior capsular phimosis, 1+ Posterior capsular opacification PC IOL in good postion with anterior capsular phimosis, 1+ Posterior capsular opacification sparing center   Anterior Vitreous post vitrectomy Vitreous syneresis         Fundus Exam       Right  Left   Disc Sharp rim, mild pallor, peripapillary drusen, PPP Pink and Sharp, central pallor, +cupping, +PPP, peripapillary drusen   C/D Ratio 0.5 0.75   Macula flat; ERM/VMT gone, blunted foveal reflex, refractile drusen, trace cystic changes -- slightly increased IN, pigment clumping, focal IRH centrally - stably resolved, +atrophy, no heme Blunted foveal reflex, refractile Drusen, early Atrophy, RPE mottling and clumping, +PEDs/CNV, +cystic changes -- increased around punctate CNV / IRH nasal macula   Vessels attenuated, mild tortuosity attenuated, mild tortuosity   Periphery Attached, scattered peripheral drusen, peripheral laser changes superiorly Attached with peripheral drusen           IMAGING AND PROCEDURES  Imaging and Procedures for 02/09/18  OCT, Retina - OU - Both Eyes       Right Eye Quality was good. Central Foveal Thickness: 234. Progression has worsened. Findings include no IRF, no SRF, abnormal foveal contour, retinal drusen , subretinal hyper-reflective material, intraretinal hyper-reflective material, pigment epithelial detachment, outer retinal atrophy (stable improvement in IRF and SRHM temporal macula, trace SRF -- slightly improved, mild interval increase in IRF / PED IN macula).   Left Eye Quality was good. Central Foveal Thickness: 242. Progression has worsened. Findings include no SRF, abnormal foveal contour, retinal drusen , subretinal hyper-reflective material, epiretinal membrane, intraretinal fluid, pigment epithelial detachment, vitreous traction, outer retinal atrophy (Interval increase in IRF / PED nasal macula,  persistent VMT w/ cystic changes, stable improvement in low PED / [facility] inferior macula).   Notes Images taken, stored on drive  Diagnosis / Impression:  OD: Exudative ARMD -- stable improvement in IRF and SRHM temporal macula, trace SRF -- slightly improved, mild interval increase in IRF / PED IN macula OS: exudative ARMD -- Interval increase in IRF / PED nasal macula, persistent VMT w/ cystic changes, stable improvement in low PED / [facility] inferior macula  Clinical management:  See below  Abbreviations: NFP - Normal foveal profile. CME - cystoid macular edema. PED - pigment epithelial detachment. IRF - intraretinal fluid. SRF - subretinal fluid. EZ - ellipsoid zone. ERM - epiretinal membrane. ORA - outer retinal atrophy. ORT - outer retinal tubulation. SRHM - subretinal hyper-reflective material       Intravitreal Injection, Pharmacologic Agent - OD - Right Eye       Time Out 05/25/2023. 7:59 AM. Confirmed correct patient, procedure, site, and patient consented.   Anesthesia Topical anesthesia was used. Anesthetic medications included Lidocaine 2%, Proparacaine 0.5%.   Procedure Preparation included 5% betadine to ocular surface, eyelid speculum. A (32g) needle was used.   Injection: 2 mg aflibercept 2 MG/0.05ML   Route: Intravitreal, Site: Right Eye   NDC: L6038910, Lot: [id number], Expiration date: 01/11/2024, Waste: 0 mL   Post-op Post injection exam found visual acuity of at least counting fingers. The patient tolerated the procedure well. There were no complications. The patient received written and verbal post procedure care education. Post injection medications were not given.      Intravitreal Injection, Pharmacologic Agent - OS - Left Eye       Time Out 05/25/2023. 8:20 AM. Confirmed correct patient, procedure, site, and patient consented.   Anesthesia Topical anesthesia was used. Anesthetic medications included Lidocaine 2%, Proparacaine 0.5%.    Procedure Preparation included 5% betadine to ocular surface, eyelid speculum. A (32g) needle was used.   Injection: 2 mg aflibercept 2 MG/0.05ML   Route: Intravitreal, Site: Left Eye   NDC: L6038910, Lot: [id number], Expiration date: 06/12/2024, Waste: 0 mL  Post-op Post injection exam found visual acuity of at least counting fingers. The patient tolerated the procedure well. There were no complications. The patient received written and verbal post procedure care education. Post injection medications were not given.            ASSESSMENT/PLAN:    ICD-10-CM   1. 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 Exudative age-related macular degeneration of left eye with active choroidal neovascularization (HCC)  H35.3221 Intravitreal Injection, Pharmacologic Agent - OS - Left Eye    aflibercept (EYLEA) SOLN 2 mg    3. 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 Pseudophakia of both eyes  Z96.1      1. 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  - S/P IVA OD #1 (12.03.18), #2 (01.02.19), #3 (01.30.19), #4 (03.01.19), #5 (04.01.19), #6 (04.30.19), #7 (06.05.19), #8 (07.19.19), #9 (08.30.19), #10 (10.14.19), #11 (11.18.19), #12 (12.30.19), #13 (02.10.20), #14 (04.06.20), #15 (09.17.20), #16 (10.15.20), #17 (12.01.20), #18 (01.18.21), #19 (03.15.21), #20 (5.11.21), #21 (07.20.21), #22 (10.14.21), #23 (01.10.22), #24 (03.07.22), #24 (05.23.22), #25 (9.1.22), #26 (11.01.22), #27 (12.28.22), # 28 (02.14.23), #29 (03.28.23) -- IVA resistance  - s/p IVE OD #1 (05.09.23) sample, #2 (06.14.23), #3 (07.12.23), #4 (08.22.23), #5 (10.03.23)., #6 (11.21.23), #7 (01.09.24), #8 (02.27.24), #9 (04.23.24), #10 (06.17.24)  - FA (04.01.19) shows mild  CNVM OD consistent with exudative ARMD  - OCT shows OD: stable improvement in IRF and SRHM temporal macula, trace SRF -- slightly improved, mild interval increase in IRF / PED IN macula  - BCVA OD 20/80 -- decreased  - recommend IVE OD #11 today, 08.12.24 w/ f/u back to 5-6 wks  - pt wishes to proceed with Eylea  - RBA of procedure discussed, questions answered  - see procedure note  - Eylea informed consent form signed and scanned on 08.12.24 (OU)  - f/u 8 weeks -- DFE/OCT/possible injection  2. Age related macular degeneration, exudative, OS  - interval conversion from non-exudative to exudative noted 07.20.21  - s/p IVA OS #1 (07.20.21), #2 (08.19.21), #3 (09.16.21), #4 (10.14.21), #5 (11.29.21), #6 (01.10.22), #7 (03.07.22), #8 (05.23.22), #9 (09.01.22), #10 (03.28.23), #11 (05.09.23), #12 (06.14.23), #13 (07.12.23), #14 (08.22.23), #15 (10.03.23), #16 (11.21.23), #17 (01.09.24), #18 (02.27.24)  - exam shows no heme  - OCT with [id number] increase in IRF / PED nasal macula, persistent VMT w/ cystic changes, stable improvement in low PED / [facility] inferior macula at 5.5 months since last injection  - BCVA OS 20/70 -- decreased  - recommend IVE OS #1 today, 08.12.24 w/ f/u in 5-6 wks  - pt wishes to proceed with injection  - RBA of procedure discussed, questions answered - IVE informed consent obtained and signed, 08.12.24 (OU) - see procedure note - f/u 5-6 weeks DFE, OCT  3. 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 Pseudophakia OU  - s/p CE/IOL OU  - s/p YAG OU  - beautiful surgeries, doing well  - continue to monitor OU  Ophthalmic Meds Ordered this  visit:  Meds ordered this encounter  Medications   aflibercept (EYLEA) SOLN 2 mg   aflibercept (EYLEA) SOLN 2 mg     Return for 5-6 wks f/u exu ARMD OU, DFE, OCT.  There are no Patient Instructions on file for this visit.  This document serves as a record of services personally performed by Karie Chimera, MD, PhD. It was created on their behalf by Annalee Genta, COMT. The creation of this record is the provider's dictation and/or activities during the visit.  Electronically signed by: Annalee Genta, COMT 05/25/23 8:56 AM  This document serves as a record of services personally performed by Karie Chimera, MD, PhD. It was created on their behalf by Glee Arvin. Manson Passey, OA an ophthalmic technician. The creation of this record is the provider's dictation and/or activities during the visit.    Electronically signed by: Glee Arvin. Manson Passey, OA 05/25/23 8:56 AM  Karie Chimera, M.D., Ph.D. Diseases & Surgery of the Retina and Vitreous [facility]  I have reviewed the above documentation for accuracy and completeness, and I agree with the above. Karie Chimera, M.D., Ph.D. 05/25/23 9:01 AM   Abbreviations: M myopia (nearsighted); A astigmatism; H hyperopia (farsighted); P presbyopia; Mrx spectacle prescription;  CTL contact lenses; OD right eye; OS left eye; OU both eyes  XT exotropia; ET esotropia; PEK punctate epithelial keratitis; PEE punctate epithelial erosions; DES dry eye syndrome; MGD meibomian gland dysfunction; ATs artificial tears; PFAT's preservative free artificial tears; NSC nuclear sclerotic cataract; PSC posterior subcapsular cataract; ERM epi-retinal membrane; PVD posterior vitreous detachment; RD retinal detachment; DM diabetes mellitus; DR diabetic retinopathy; NPDR non-proliferative diabetic retinopathy; PDR proliferative diabetic retinopathy; CSME clinically significant macular edema; DME diabetic macular edema; dbh dot blot hemorrhages; CWS cotton wool  spot; POAG primary open angle glaucoma; C/D cup-to-disc ratio; HVF humphrey visual field; GVF goldmann visual field; OCT optical coherence tomography; IOP intraocular pressure; BRVO Branch retinal vein occlusion; CRVO central retinal vein occlusion; CRAO central retinal artery occlusion; BRAO branch retinal artery occlusion; RT retinal tear; SB scleral buckle; PPV pars plana vitrectomy; VH Vitreous hemorrhage; PRP panretinal laser photocoagulation; IVK intravitreal kenalog; VMT vitreomacular traction; MH Macular hole;  NVD neovascularization of the disc; NVE neovascularization elsewhere; AREDS age related eye disease study; ARMD age related macular degeneration; POAG primary open angle glaucoma; EBMD epithelial/anterior basement membrane dystrophy; ACIOL anterior chamber intraocular lens; IOL intraocular lens; PCIOL posterior chamber intraocular lens; Phaco/IOL phacoemulsification with intraocular lens placement; PRK photorefractive keratectomy; LASIK laser assisted in situ keratomileusis; HTN hypertension; DM diabetes mellitus; COPD chronic obstructive pulmonary disease

## 2023-05-25 ENCOUNTER — Encounter (INDEPENDENT_AMBULATORY_CARE_PROVIDER_SITE_OTHER): Payer: Self-pay | Admitting: Ophthalmology

## 2023-05-25 ENCOUNTER — Ambulatory Visit (INDEPENDENT_AMBULATORY_CARE_PROVIDER_SITE_OTHER): Payer: Medicare Other | Admitting: Ophthalmology

## 2023-05-25 DIAGNOSIS — Z961 Presence of intraocular lens: Secondary | ICD-10-CM

## 2023-05-25 DIAGNOSIS — H353221 Exudative age-related macular degeneration, left eye, with active choroidal neovascularization: Secondary | ICD-10-CM

## 2023-05-25 DIAGNOSIS — H43823 Vitreomacular adhesion, bilateral: Secondary | ICD-10-CM | POA: Diagnosis not present

## 2023-05-25 DIAGNOSIS — H353231 Exudative age-related macular degeneration, bilateral, with active choroidal neovascularization: Secondary | ICD-10-CM | POA: Diagnosis not present

## 2023-05-25 DIAGNOSIS — H353211 Exudative age-related macular degeneration, right eye, with active choroidal neovascularization: Secondary | ICD-10-CM

## 2023-05-25 MED ORDER — AFLIBERCEPT 2MG/0.05ML IZ SOLN FOR KALEIDOSCOPE
2.0000 mg | INTRAVITREAL | Status: AC | PRN
Start: 2023-05-25 — End: 2023-05-25
  Administered 2023-05-25: 2 mg via INTRAVITREAL

## 2023-06-18 NOTE — Progress Notes (Signed)
[facility] - Clinic Note  06/22/2023     CHIEF COMPLAINT Patient presents for Retina Follow Up    HISTORY OF PRESENT ILLNESS: Lindsay Harvey is a 87 y.o. 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  This started 5 weeks ago.  Duration of 5 weeks.  Since onset it is stable.  I, the attending physician,  performed the HPI with the patient and updated documentation appropriately.        Comments   5 week retina follow up ARMD and IVE OD pt is reporting no vision changes noticed she denies any flashes or floaters       Last edited by Rennis Chris, MD on 06/22/2023 11:02 AM.    Pt states vision is stable   Referring physician: Ralene Ok, MD [address] [place],  [place] [address]  HISTORICAL INFORMATION:   Selected notes from the MEDICAL RECORD NUMBER Referred by Dr. Marcha Solders for concern of AMD OU, CNV   CURRENT MEDICATIONS: Current Outpatient Medications (Ophthalmic Drugs)  Medication Sig   carboxymethylcellulose (REFRESH PLUS) 0.5 % SOLN Place 1 drop into both eyes 3 (three) times daily as needed (dry/irritated eyes).    prednisoLONE acetate (PRED FORTE) 1 % ophthalmic suspension Place 1 drop into the right eye 4 (four) times daily.   REFRESH OPTIVE MEGA-3 0.5-1-0.5 % SOLN Place 1 drop into both eyes 3 (three) times daily as needed (discomfort/eye irritation.).   No current facility-administered medications for this visit. (Ophthalmic Drugs)   Current Outpatient Medications (Other)  Medication Sig   amLODipine (NORVASC) 5 MG tablet TAKE 1 TABLET BY MOUTH DAILY   aspirin EC 81 MG tablet Take 1 tablet (81 mg total) by mouth daily. Swallow whole.   calcium carbonate (OSCAL) 1500 (600 Ca) MG TABS tablet Take 600 mg of elemental calcium by mouth 2 (two) times a day.   Cholecalciferol (VITAMIN D3) 50 MCG (2000 UT) TABS Take 2,000 Units by mouth daily.   nitroGLYCERIN  (NITROSTAT) 0.4 MG SL tablet Place 1 tablet (0.4 mg total) under the tongue every 5 (five) minutes as needed for chest pain.   simvastatin (ZOCOR) 40 MG tablet Take 20 mg by mouth every evening. 1/4 tablet at night   Current Facility-Administered Medications (Other)  Medication Route   Bevacizumab (AVASTIN) SOLN 1.25 mg Intravitreal   Bevacizumab (AVASTIN) SOLN 1.25 mg Intravitreal   Bevacizumab (AVASTIN) SOLN 1.25 mg Intravitreal   Bevacizumab (AVASTIN) SOLN 1.25 mg Intravitreal   Bevacizumab (AVASTIN) SOLN 1.25 mg Intravitreal   Bevacizumab (AVASTIN) SOLN 1.25 mg Intravitreal   Bevacizumab (AVASTIN) SOLN 1.25 mg Intravitreal   Bevacizumab (AVASTIN) SOLN 1.25 mg Intravitreal   Bevacizumab (AVASTIN) SOLN 1.25 mg Intravitreal   Bevacizumab (AVASTIN) SOLN 1.25 mg Intravitreal   Bevacizumab (AVASTIN) SOLN 1.25 mg Intravitreal   Bevacizumab (AVASTIN) SOLN 1.25 mg Intravitreal   Bevacizumab (AVASTIN) SOLN 1.25 mg Intravitreal   REVIEW OF SYSTEMS: ROS   Positive for: Gastrointestinal, Eyes Negative for: Constitutional, Neurological, Skin, Genitourinary, Musculoskeletal, HENT, Endocrine, Cardiovascular, Respiratory, Psychiatric, Allergic/Imm, Heme/Lymph Last edited by Etheleen Mayhew, COT on 06/22/2023  8:21 AM.     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   Headache  Hypercholesterolemia    Macular degeneration    Wet OU   Wears glasses    Past Surgical History:  Procedure Laterality Date   25 GAUGE PARS PLANA VITRECTOMY WITH 20 GAUGE MVR PORT FOR MACULAR HOLE Right 04/07/2019   Procedure: 25 GAUGE PARS PLANA VITRECTOMY WITH 20 GAUGE MVR PORT FOR MACULAR HOLE;  Surgeon: Rennis Chris, MD;  Location: [facility] OR;  Service: Ophthalmology;  Laterality: Right;   CATARACT EXTRACTION Bilateral    2013   DILATION AND CURETTAGE OF UTERUS     EYE SURGERY  Bilateral 2013   Cat Sx   GAS/FLUID EXCHANGE Right 04/07/2019   Procedure: Gas/Fluid Exchange;  Surgeon: Rennis Chris, MD;  Location: [facility] OR;  Service: Ophthalmology;  Laterality: Right;   MEMBRANE PEEL Right 04/07/2019   Procedure: Eula Flax;  Surgeon: Rennis Chris, MD;  Location: [facility] OR;  Service: Ophthalmology;  Laterality: Right;   PHOTOCOAGULATION WITH LASER Right 04/07/2019   Procedure: Photocoagulation With Laser;  Surgeon: Rennis Chris, MD;  Location: [facility] OR;  Service: Ophthalmology;  Laterality: Right;   YAG LASER APPLICATION Bilateral    FAMILY HISTORY Family History  Problem Relation Age of Onset   Stroke Father    SOCIAL HISTORY Social History   Tobacco Use   Smoking status: Never   Smokeless tobacco: Never  Vaping Use   Vaping status: Never Used  Substance Use Topics   Alcohol use: No   Drug use: No       OPHTHALMIC EXAM: Base Eye Exam     Visual Acuity (Snellen - Linear)       Right Left   Dist [place] 20/80 -1 20/70 -2   Dist ph [place] NI NI    Correction: Glasses         Tonometry (Tonopen, 8:24 AM)       Right Left   Pressure 12 10         Pupils       Pupils Dark Light Shape React APD   Right PERRL 3 2 Round Brisk None   Left PERRL 3 2 Round Brisk None         Visual Fields       Left Right    Full Full         Extraocular Movement       Right Left    Full, Ortho Full, Ortho         Neuro/Psych     Oriented x3: Yes   Mood/Affect: Normal         Dilation     Both eyes: 2.5% Phenylephrine @ 8:22 AM           Slit Lamp and Fundus Exam     Slit Lamp Exam       Right Left   Lids/Lashes Dermatochalasis - upper lid, periorbital edema Dermatochalasis - upper lid   Conjunctiva/Sclera White and quiet White and quiet   Cornea Arcus, 1+ fine Punctate epithelial erosions, mild endo pigment Arcus, 3+ Punctate epithelial erosions, mild tear film debris, mild endo pigment, dry tear fil, decreased TBUT   Anterior Chamber  Deep, quiet Deep and quiet   Iris Round and dilated Round and dilated   Lens PC IOL in good postion with anterior capsular phimosis, 1+ Posterior capsular opacification PC IOL in good postion with anterior capsular phimosis, 1+ Posterior capsular opacification sparing center   Anterior Vitreous post vitrectomy Vitreous syneresis         Fundus Exam  Right Left   Disc Sharp rim, mild pallor, peripapillary drusen, PPP Pink and Sharp, central pallor, +cupping, +PPP, peripapillary drusen   C/D Ratio 0.5 0.75   Macula flat; ERM/VMT gone, blunted foveal reflex, refractile drusen, trace cystic changes -- slightly improved IN, pigment clumping, focal IRH centrally - stably resolved, +atrophy, no heme Blunted foveal reflex, refractile Drusen, early Atrophy, RPE mottling and clumping, +PEDs/CNV, punctate CNV / IRH nasal macula with cystic changes nasal macula -- improved   Vessels attenuated, mild tortuosity attenuated, mild tortuosity   Periphery Attached, scattered peripheral drusen, peripheral laser changes superiorly Attached with peripheral drusen           IMAGING AND PROCEDURES  Imaging and Procedures for 02/09/18  OCT, Retina - OU - Both Eyes       Right Eye Quality was good. Central Foveal Thickness: 215. Progression has improved. Findings include no IRF, no SRF, abnormal foveal contour, retinal drusen , subretinal hyper-reflective material, intraretinal hyper-reflective material, pigment epithelial detachment, outer retinal atrophy (stable improvement in IRF and SRHM temporal macula, mild interval improved in IRF / PED IN macula).   Left Eye Quality was good. Central Foveal Thickness: 239. Progression has improved. Findings include no SRF, abnormal foveal contour, retinal drusen , subretinal hyper-reflective material, epiretinal membrane, intraretinal fluid, pigment epithelial detachment, vitreous traction, outer retinal atrophy (Interval improvement in IRF / PED nasal macula,  persistent VMT w/ cystic changes, stable improvement in low PED / [facility] inferior macula).   Notes Images taken, stored on drive  Diagnosis / Impression:  OD: Exudative ARMD -- stable improvement in IRF and SRHM temporal macula, mild interval improved in IRF / PED IN macula OS: exudative ARMD -- Interval improvement in IRF / PED nasal macula, persistent VMT w/ cystic changes, stable improvement in low PED / [facility] inferior macula  Clinical management:  See below  Abbreviations: NFP - Normal foveal profile. CME - cystoid macular edema. PED - pigment epithelial detachment. IRF - intraretinal fluid. SRF - subretinal fluid. EZ - ellipsoid zone. ERM - epiretinal membrane. ORA - outer retinal atrophy. ORT - outer retinal tubulation. SRHM - subretinal hyper-reflective material       Intravitreal Injection, Pharmacologic Agent - OD - Right Eye       Time Out 06/22/2023. 9:07 AM. Confirmed correct patient, procedure, site, and patient consented.   Anesthesia Topical anesthesia was used. Anesthetic medications included Lidocaine 2%, Proparacaine 0.5%.   Procedure Preparation included 5% betadine to ocular surface, eyelid speculum. A (32g) needle was used.   Injection: 2 mg aflibercept 2 MG/0.05ML   Route: Intravitreal, Site: Right Eye   NDC: L6038910, Lot: [id number], Expiration date: 09/11/2024, Waste: 0 mL   Post-op Post injection exam found visual acuity of at least counting fingers. The patient tolerated the procedure well. There were no complications. The patient received written and verbal post procedure care education. Post injection medications were not given.      Intravitreal Injection, Pharmacologic Agent - OS - Left Eye       Time Out 06/22/2023. 9:08 AM. Confirmed correct patient, procedure, site, and patient consented.   Anesthesia Topical anesthesia was used. Anesthetic medications included Lidocaine 2%, Proparacaine 0.5%.   Procedure Preparation included 5% betadine  to ocular surface, eyelid speculum. A (32g) needle was used.   Injection: 2 mg aflibercept 2 MG/0.05ML   Route: Intravitreal, Site: Left Eye   NDC: L6038910, Lot: [id number], Expiration date: 09/11/2024, Waste: 0 mL   Post-op Post injection exam found visual acuity  of at least counting fingers. The patient tolerated the procedure well. There were no complications. The patient received written and verbal post procedure care education. Post injection medications were not given.            ASSESSMENT/PLAN:    ICD-10-CM   1. 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 Exudative age-related macular degeneration of left eye with active choroidal neovascularization (HCC)  H35.3221 OCT, Retina - OU - Both Eyes    Intravitreal Injection, Pharmacologic Agent - OS - Left Eye    aflibercept (EYLEA) SOLN 2 mg    3. 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 Pseudophakia of both eyes  Z96.1      1. 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  - S/P IVA OD #1 (12.03.18), #2 (01.02.19), #3 (01.30.19), #4 (03.01.19), #5 (04.01.19), #6 (04.30.19), #7 (06.05.19), #8 (07.19.19), #9 (08.30.19), #10 (10.14.19), #11 (11.18.19), #12 (12.30.19), #13 (02.10.20), #14 (04.06.20), #15 (09.17.20), #16 (10.15.20), #17 (12.01.20), #18 (01.18.21), #19 (03.15.21), #20 (5.11.21), #21 (07.20.21), #22 (10.14.21), #23 (01.10.22), #24 (03.07.22), #24 (05.23.22), #25 (9.1.22), #26 (11.01.22), #27 (12.28.22), # 28 (02.14.23), #29 (03.28.23) -- IVA resistance  - s/p IVE OD #1 (05.09.23) sample, #2 (06.14.23), #3 (07.12.23), #4 (08.22.23), #5 (10.03.23)., #6 (11.21.23), #7 (01.09.24), #8 (02.27.24), #9 (04.23.24), #10 (06.17.24), #11 (08.12.24)  - FA (04.01.19) shows mild  CNVM OD consistent with exudative ARMD  - OCT shows OD: stable improvement in IRF and SRHM temporal macula, mild interval improvement in IRF / PED IN macula at 4 wks  - BCVA OD 20/80 -- stable  - recommend IVE OD #12 today, 09.09.24 w/ f/u in 5 wks  - pt wishes to proceed with Eylea  - RBA of procedure discussed, questions answered  - see procedure note  - Eylea informed consent form signed and scanned on 08.12.24 (OU)  - f/u 5 weeks -- DFE/OCT/possible injection  2. Age related macular degeneration, exudative, OS  - interval conversion from non-exudative to exudative noted 07.20.21  - s/p IVA OS #1 (07.20.21), #2 (08.19.21), #3 (09.16.21), #4 (10.14.21), #5 (11.29.21), #6 (01.10.22), #7 (03.07.22), #8 (05.23.22), #9 (09.01.22), #10 (03.28.23), #11 (05.09.23), #12 (06.14.23), #13 (07.12.23), #14 (08.22.23), #15 (10.03.23), #16 (11.21.23), #17 (01.09.24), #18 (02.27.24)  - s/p IVE OS #1 (08.12.24)  - exam shows no heme  - OCT with OS: Interval improvement in IRF / PED nasal macula, persistent VMT w/ cystic changes, stable improvement in low PED / [facility] inferior macula at 4 wks  - BCVA OS 20/70 -- stable  - recommend IVE OS #2 today, 09.09.24 w/ f/u in 5 wks  - pt wishes to proceed with injection  - RBA of procedure discussed, questions answered - IVE informed consent obtained and signed, 08.12.24 (OU) - see procedure note - f/u 5 weeks DFE, OCT  3. 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 Pseudophakia OU  - s/p CE/IOL OU  - s/p YAG OU  - beautiful surgeries, doing well  - continue to monitor  Ophthalmic Meds Ordered this visit:  Meds ordered this encounter   Medications   aflibercept (EYLEA) SOLN 2 mg   aflibercept (EYLEA) SOLN 2 mg     Return in about 5 weeks (around 07/27/2023) for f/u exu ARMD OU, DFE, OCT.  There are no Patient Instructions on file for this visit.  This document serves as a record of services personally performed by Karie Chimera, MD, PhD. It was created on their behalf by Annalee Genta, COMT. The creation of this record is the provider's dictation and/or activities during the visit.  Electronically signed by: Annalee Genta, COMT 06/22/23 11:04 AM  This document serves as a record of services personally performed by Karie Chimera, MD, PhD. It was created on their behalf by Glee Arvin. Manson Passey, OA an ophthalmic technician. The creation of this record is the provider's dictation and/or activities during the visit.    Electronically signed by: Glee Arvin. Manson Passey, OA 06/22/23 11:04 AM  Karie Chimera, M.D., Ph.D. Diseases & Surgery of the Retina and Vitreous [facility]  I have reviewed the above documentation for accuracy and completeness, and I agree with the above. Karie Chimera, M.D., Ph.D. 06/22/23 11:06 AM  Abbreviations: M myopia (nearsighted); A astigmatism; H hyperopia (farsighted); P presbyopia; Mrx spectacle prescription;  CTL contact lenses; OD right eye; OS left eye; OU both eyes  XT exotropia; ET esotropia; PEK punctate epithelial keratitis; PEE punctate epithelial erosions; DES dry eye syndrome; MGD meibomian gland dysfunction; ATs artificial tears; PFAT's preservative free artificial tears; NSC nuclear sclerotic cataract; PSC posterior subcapsular cataract; ERM epi-retinal membrane; PVD posterior vitreous detachment; RD retinal detachment; DM diabetes mellitus; DR diabetic retinopathy; NPDR non-proliferative diabetic retinopathy; PDR proliferative diabetic retinopathy; CSME clinically significant macular edema; DME diabetic macular edema; dbh dot blot hemorrhages; CWS cotton wool spot; POAG  primary open angle glaucoma; C/D cup-to-disc ratio; HVF humphrey visual field; GVF goldmann visual field; OCT optical coherence tomography; IOP intraocular pressure; BRVO Branch retinal vein occlusion; CRVO central retinal vein occlusion; CRAO central retinal artery occlusion; BRAO branch retinal artery occlusion; RT retinal tear; SB scleral buckle; PPV pars plana vitrectomy; VH Vitreous hemorrhage; PRP panretinal laser photocoagulation; IVK intravitreal kenalog; VMT vitreomacular traction; MH Macular hole;  NVD neovascularization of the disc; NVE neovascularization elsewhere; AREDS age related eye disease study; ARMD age related macular degeneration; POAG primary open angle glaucoma; EBMD epithelial/anterior basement membrane dystrophy; ACIOL anterior chamber intraocular lens; IOL intraocular lens; PCIOL posterior chamber intraocular lens; Phaco/IOL phacoemulsification with intraocular lens placement; PRK photorefractive keratectomy; LASIK laser assisted in situ keratomileusis; HTN hypertension; DM diabetes mellitus; COPD chronic obstructive pulmonary disease

## 2023-06-22 ENCOUNTER — Encounter (INDEPENDENT_AMBULATORY_CARE_PROVIDER_SITE_OTHER): Payer: Self-pay | Admitting: Ophthalmology

## 2023-06-22 ENCOUNTER — Ambulatory Visit (INDEPENDENT_AMBULATORY_CARE_PROVIDER_SITE_OTHER): Payer: Medicare Other | Admitting: Ophthalmology

## 2023-06-22 DIAGNOSIS — H43823 Vitreomacular adhesion, bilateral: Secondary | ICD-10-CM | POA: Diagnosis not present

## 2023-06-22 DIAGNOSIS — Z961 Presence of intraocular lens: Secondary | ICD-10-CM | POA: Diagnosis not present

## 2023-06-22 DIAGNOSIS — H353221 Exudative age-related macular degeneration, left eye, with active choroidal neovascularization: Secondary | ICD-10-CM

## 2023-06-22 DIAGNOSIS — H353231 Exudative age-related macular degeneration, bilateral, with active choroidal neovascularization: Secondary | ICD-10-CM

## 2023-06-22 DIAGNOSIS — H353211 Exudative age-related macular degeneration, right eye, with active choroidal neovascularization: Secondary | ICD-10-CM

## 2023-06-22 MED ORDER — AFLIBERCEPT 2MG/0.05ML IZ SOLN FOR KALEIDOSCOPE
2.0000 mg | INTRAVITREAL | Status: AC | PRN
Start: 2023-06-22 — End: 2023-06-22
  Administered 2023-06-22: 2 mg via INTRAVITREAL

## 2023-07-14 NOTE — Progress Notes (Signed)
[facility] - Clinic Note  07/28/2023     CHIEF COMPLAINT Patient presents for Retina Follow Up    HISTORY OF PRESENT ILLNESS: Lindsay Harvey is a 87 y.o. 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  This started 5 weeks ago.  Duration of 5 weeks.  Since onset it is stable.  I, the attending physician,  performed the HPI with the patient and updated documentation appropriately.        Comments   5 week retina follow up AMD OU and IVE OU pt is reporting no vision changes noticed she denies any flashes or floaters       Last edited by Rennis Chris, MD on 07/28/2023  9:18 PM.    Pt states vision is stable   Referring physician: Ralene Ok, MD [address] [place],  [place] [address]  HISTORICAL INFORMATION:   Selected notes from the MEDICAL RECORD NUMBER Referred by Dr. Marcha Solders for concern of AMD OU, CNV   CURRENT MEDICATIONS: Current Outpatient Medications (Ophthalmic Drugs)  Medication Sig   carboxymethylcellulose (REFRESH PLUS) 0.5 % SOLN Place 1 drop into both eyes 3 (three) times daily as needed (dry/irritated eyes).    prednisoLONE acetate (PRED FORTE) 1 % ophthalmic suspension Place 1 drop into the right eye 4 (four) times daily.   REFRESH OPTIVE MEGA-3 0.5-1-0.5 % SOLN Place 1 drop into both eyes 3 (three) times daily as needed (discomfort/eye irritation.).   No current facility-administered medications for this visit. (Ophthalmic Drugs)   Current Outpatient Medications (Other)  Medication Sig   amLODipine (NORVASC) 5 MG tablet TAKE 1 TABLET BY MOUTH DAILY   aspirin EC 81 MG tablet Take 1 tablet (81 mg total) by mouth daily. Swallow whole.   calcium carbonate (OSCAL) 1500 (600 Ca) MG TABS tablet Take 600 mg of elemental calcium by mouth 2 (two) times a day.   Cholecalciferol (VITAMIN D3) 50 MCG (2000 UT) TABS Take 2,000 Units by mouth daily.   nitroGLYCERIN  (NITROSTAT) 0.4 MG SL tablet Place 1 tablet (0.4 mg total) under the tongue every 5 (five) minutes as needed for chest pain.   simvastatin (ZOCOR) 40 MG tablet Take 20 mg by mouth every evening. 1/4 tablet at night   Current Facility-Administered Medications (Other)  Medication Route   Bevacizumab (AVASTIN) SOLN 1.25 mg Intravitreal   Bevacizumab (AVASTIN) SOLN 1.25 mg Intravitreal   Bevacizumab (AVASTIN) SOLN 1.25 mg Intravitreal   Bevacizumab (AVASTIN) SOLN 1.25 mg Intravitreal   Bevacizumab (AVASTIN) SOLN 1.25 mg Intravitreal   Bevacizumab (AVASTIN) SOLN 1.25 mg Intravitreal   Bevacizumab (AVASTIN) SOLN 1.25 mg Intravitreal   Bevacizumab (AVASTIN) SOLN 1.25 mg Intravitreal   Bevacizumab (AVASTIN) SOLN 1.25 mg Intravitreal   Bevacizumab (AVASTIN) SOLN 1.25 mg Intravitreal   Bevacizumab (AVASTIN) SOLN 1.25 mg Intravitreal   Bevacizumab (AVASTIN) SOLN 1.25 mg Intravitreal   Bevacizumab (AVASTIN) SOLN 1.25 mg Intravitreal   REVIEW OF SYSTEMS: ROS   Positive for: Gastrointestinal, Eyes Negative for: Constitutional, Neurological, Skin, Genitourinary, Musculoskeletal, HENT, Endocrine, Cardiovascular, Respiratory, Psychiatric, Allergic/Imm, Heme/Lymph Last edited by Etheleen Mayhew, COT on 07/28/2023  8:20 AM.     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   Headache  Hypercholesterolemia    Macular degeneration    Wet OU   Wears glasses    Past Surgical History:  Procedure Laterality Date   25 GAUGE PARS PLANA VITRECTOMY WITH 20 GAUGE MVR PORT FOR MACULAR HOLE Right 04/07/2019   Procedure: 25 GAUGE PARS PLANA VITRECTOMY WITH 20 GAUGE MVR PORT FOR MACULAR HOLE;  Surgeon: Rennis Chris, MD;  Location: [facility] OR;  Service: Ophthalmology;  Laterality: Right;   CATARACT EXTRACTION Bilateral    2013   DILATION AND CURETTAGE OF UTERUS     EYE SURGERY  Bilateral 2013   Cat Sx   GAS/FLUID EXCHANGE Right 04/07/2019   Procedure: Gas/Fluid Exchange;  Surgeon: Rennis Chris, MD;  Location: [facility] OR;  Service: Ophthalmology;  Laterality: Right;   MEMBRANE PEEL Right 04/07/2019   Procedure: Eula Flax;  Surgeon: Rennis Chris, MD;  Location: [facility] OR;  Service: Ophthalmology;  Laterality: Right;   PHOTOCOAGULATION WITH LASER Right 04/07/2019   Procedure: Photocoagulation With Laser;  Surgeon: Rennis Chris, MD;  Location: [facility] OR;  Service: Ophthalmology;  Laterality: Right;   YAG LASER APPLICATION Bilateral    FAMILY HISTORY Family History  Problem Relation Age of Onset   Stroke Father    SOCIAL HISTORY Social History   Tobacco Use   Smoking status: Never   Smokeless tobacco: Never  Vaping Use   Vaping status: Never Used  Substance Use Topics   Alcohol use: No   Drug use: No       OPHTHALMIC EXAM: Base Eye Exam     Visual Acuity (Snellen - Linear)       Right Left   Dist [place] 20/80 -2 20/60 -3   Dist ph [place] 20/60 -2 NI         Tonometry (Tonopen, 8:26 AM)       Right Left   Pressure 11 13         Pupils       Pupils Dark Light Shape React APD   Right PERRL 3 2 Round Brisk None   Left PERRL 3 2 Round Brisk None         Visual Fields       Left Right    Full Full         Neuro/Psych     Oriented x3: Yes   Mood/Affect: Normal         Dilation     Both eyes: 2.5% Phenylephrine @ 8:26 AM           Slit Lamp and Fundus Exam     Slit Lamp Exam       Right Left   Lids/Lashes Dermatochalasis - upper lid, periorbital edema Dermatochalasis - upper lid   Conjunctiva/Sclera White and quiet White and quiet   Cornea Arcus, 1+ fine Punctate epithelial erosions, mild endo pigment Arcus, 3+ Punctate epithelial erosions, mild tear film debris, mild endo pigment, dry tear fil, decreased TBUT   Anterior Chamber Deep, quiet Deep and quiet   Iris Round and dilated Round and dilated   Lens PC IOL in good postion  with anterior capsular phimosis, 1+ Posterior capsular opacification PC IOL in good postion with anterior capsular phimosis, 1+ Posterior capsular opacification sparing center   Anterior Vitreous post vitrectomy Vitreous syneresis         Fundus Exam       Right Left   Disc Sharp rim, mild pallor, peripapillary drusen, PPP Pink and Sharp, central pallor, +cupping, +PPP, peripapillary drusen   C/D Ratio  0.5 0.75   Macula flat; ERM/VMT gone, blunted foveal reflex, refractile drusen, trace cystic changes -- stably improved, pigment clumping, focal IRH centrally - stably resolved, +atrophy, no heme Blunted foveal reflex, refractile Drusen, early Atrophy, RPE mottling and clumping, punctate CNV / IRH nasal macula with cystic changes -- stably improved   Vessels attenuated, mild tortuosity attenuated, mild tortuosity   Periphery Attached, scattered peripheral drusen, peripheral laser changes superiorly Attached with peripheral drusen           IMAGING AND PROCEDURES  Imaging and Procedures for 02/09/18  OCT, Retina - OU - Both Eyes       Right Eye Quality was good. Central Foveal Thickness: 203. Progression has been stable. Findings include no IRF, no SRF, abnormal foveal contour, retinal drusen , subretinal hyper-reflective material, intraretinal hyper-reflective material, pigment epithelial detachment, outer retinal atrophy (stable improvement in IRF and SRHM temporal macula, stable improved in IRF / PED IN macula).   Left Eye Quality was good. Central Foveal Thickness: 230. Progression has been stable. Findings include no SRF, abnormal foveal contour, retinal drusen , subretinal hyper-reflective material, epiretinal membrane, intraretinal fluid, pigment epithelial detachment, vitreous traction, outer retinal atrophy (stable improvement in IRF / PED nasal macula, persistent VMT w/ cystic changes, stable improvement in low PED / [facility] inferior macula).   Notes Images taken, stored on  drive  Diagnosis / Impression:  OD: Exudative ARMD -- stable improvement in IRF and SRHM temporal macula, stable improvement in IRF / PED IN macula OS: exudative ARMD -- stable improvement in IRF / PED nasal macula, persistent VMT w/ cystic changes, stable improvement in low PED / [facility] inferior macula  Clinical management:  See below  Abbreviations: NFP - Normal foveal profile. CME - cystoid macular edema. PED - pigment epithelial detachment. IRF - intraretinal fluid. SRF - subretinal fluid. EZ - ellipsoid zone. ERM - epiretinal membrane. ORA - outer retinal atrophy. ORT - outer retinal tubulation. SRHM - subretinal hyper-reflective material       Intravitreal Injection, Pharmacologic Agent - OD - Right Eye       Time Out 07/28/2023. 9:04 AM. Confirmed correct patient, procedure, site, and patient consented.   Anesthesia Topical anesthesia was used. Anesthetic medications included Lidocaine 2%, Proparacaine 0.5%.   Procedure Preparation included 5% betadine to ocular surface, eyelid speculum. A (32g) needle was used.   Injection: 2 mg aflibercept 2 MG/0.05ML   Route: Intravitreal, Site: Right Eye   NDC: L6038910, Lot: [id number], Expiration date: 08/12/2024, Waste: 0 mL   Post-op Post injection exam found visual acuity of at least counting fingers. The patient tolerated the procedure well. There were no complications. The patient received written and verbal post procedure care education. Post injection medications were not given.      Intravitreal Injection, Pharmacologic Agent - OS - Left Eye       Time Out 07/28/2023. 9:04 AM. Confirmed correct patient, procedure, site, and patient consented.   Anesthesia Topical anesthesia was used. Anesthetic medications included Lidocaine 2%, Proparacaine 0.5%.   Procedure Preparation included 5% betadine to ocular surface, eyelid speculum. A (32g) needle was used.   Injection: 2 mg aflibercept 2 MG/0.05ML   Route:  Intravitreal, Site: Left Eye   NDC: L6038910, Lot: [id number], Expiration date: 09/11/2024, Waste: 0 mL   Post-op Post injection exam found visual acuity of at least counting fingers. The patient tolerated the procedure well. There were no complications. The patient received written and verbal post procedure care education. Post injection medications  were not given.            ASSESSMENT/PLAN:    ICD-10-CM   1. 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 Exudative age-related macular degeneration of left eye with active choroidal neovascularization (HCC)  H35.3221 OCT, Retina - OU - Both Eyes    Intravitreal Injection, Pharmacologic Agent - OS - Left Eye    aflibercept (EYLEA) SOLN 2 mg    3. 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 Pseudophakia of both eyes  Z96.1      1. 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  - S/P IVA OD #1 (12.03.18), #2 (01.02.19), #3 (01.30.19), #4 (03.01.19), #5 (04.01.19), #6 (04.30.19), #7 (06.05.19), #8 (07.19.19), #9 (08.30.19), #10 (10.14.19), #11 (11.18.19), #12 (12.30.19), #13 (02.10.20), #14 (04.06.20), #15 (09.17.20), #16 (10.15.20), #17 (12.01.20), #18 (01.18.21), #19 (03.15.21), #20 (5.11.21), #21 (07.20.21), #22 (10.14.21), #23 (01.10.22), #24 (03.07.22), #24 (05.23.22), #25 (9.1.22), #26 (11.01.22), #27 (12.28.22), # 28 (02.14.23), #29 (03.28.23) -- IVA resistance  - s/p IVE OD #1 (05.09.23) sample, #2 (06.14.23), #3 (07.12.23), #4 (08.22.23), #5 (10.03.23)., #6 (11.21.23), #7 (01.09.24), #8 (02.27.24), #9 (04.23.24), #10 (06.17.24), #11 (08.12.24), #12 (09.09.24)  - FA (04.01.19) shows mild CNVM OD consistent with exudative ARMD  - OCT shows OD: stable improvement in IRF and SRHM temporal  macula, stable improved in IRF / PED IN macula at 5 weeks  - BCVA OD 20/60 from 20/80  - recommend IVE OD #13 today, 10.15.24 w/ f/u extended to 6 wks  - pt wishes to proceed with Eylea  - RBA of procedure discussed, questions answered  - see procedure note  - Eylea informed consent form signed and scanned on 08.12.24 (OU)  - f/u 6 weeks -- DFE/OCT/possible injection  2. Age related macular degeneration, exudative, OS  - interval conversion from non-exudative to exudative noted 07.20.21  - s/p IVA OS #1 (07.20.21), #2 (08.19.21), #3 (09.16.21), #4 (10.14.21), #5 (11.29.21), #6 (01.10.22), #7 (03.07.22), #8 (05.23.22), #9 (09.01.22), #10 (03.28.23), #11 (05.09.23), #12 (06.14.23), #13 (07.12.23), #14 (08.22.23), #15 (10.03.23), #16 (11.21.23), #17 (01.09.24), #18 (02.27.24)  - s/p IVE OS #1 (08.12.24), #2 (09.09.24)  - exam shows no heme  - OCT with OS: stable improvement in IRF / PED nasal macula, persistent VMT w/ cystic changes, stable improvement in low PED / [facility] inferior macula at 5 wks  - BCVA OS 20/60 from 20/70  - recommend IVE OS #3 today, 10.15.24 w/ f/u extended to 6 wks  - pt wishes to proceed with injection  - RBA of procedure discussed, questions answered - IVE informed consent obtained and signed, 08.12.24 (OU) - see procedure note - f/u 6 weeks DFE, OCT  3. 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 Pseudophakia OU  - s/p CE/IOL OU  - s/p YAG OU  - beautiful surgeries, doing well  - continue to monitor  Ophthalmic Meds Ordered this visit:  Meds ordered this encounter  Medications   aflibercept (EYLEA) SOLN 2 mg   aflibercept (EYLEA) SOLN 2  mg     Return in about 6 weeks (around 09/08/2023) for f/u exu ARMD OU, DFE, OCT.  There are no Patient Instructions on file for this visit.  This document serves as a record of services personally performed by Karie Chimera, MD, PhD. It was created on their behalf by Charlette Caffey, COT an ophthalmic technician. The creation of this record is the provider's dictation and/or activities during the visit.    Electronically signed by:  Charlette Caffey, COT  07/28/23 9:18 PM  This document serves as a record of services personally performed by Karie Chimera, MD, PhD. It was created on their behalf by Glee Arvin. Manson Passey, OA an ophthalmic technician. The creation of this record is the provider's dictation and/or activities during the visit.    Electronically signed by: Glee Arvin. Manson Passey, OA 07/28/23 9:18 PM  Karie Chimera, M.D., Ph.D. Diseases & Surgery of the Retina and Vitreous [facility]  I have reviewed the above documentation for accuracy and completeness, and I agree with the above. Karie Chimera, M.D., Ph.D. 07/28/23 9:20 PM   Abbreviations: M myopia (nearsighted); A astigmatism; H hyperopia (farsighted); P presbyopia; Mrx spectacle prescription;  CTL contact lenses; OD right eye; OS left eye; OU both eyes  XT exotropia; ET esotropia; PEK punctate epithelial keratitis; PEE punctate epithelial erosions; DES dry eye syndrome; MGD meibomian gland dysfunction; ATs artificial tears; PFAT's preservative free artificial tears; NSC nuclear sclerotic cataract; PSC posterior subcapsular cataract; ERM epi-retinal membrane; PVD posterior vitreous detachment; RD retinal detachment; DM diabetes mellitus; DR diabetic retinopathy; NPDR non-proliferative diabetic retinopathy; PDR proliferative diabetic retinopathy; CSME clinically significant macular edema; DME diabetic macular edema; dbh dot blot hemorrhages; CWS cotton wool spot; POAG primary open angle glaucoma; C/D  cup-to-disc ratio; HVF humphrey visual field; GVF goldmann visual field; OCT optical coherence tomography; IOP intraocular pressure; BRVO Branch retinal vein occlusion; CRVO central retinal vein occlusion; CRAO central retinal artery occlusion; BRAO branch retinal artery occlusion; RT retinal tear; SB scleral buckle; PPV pars plana vitrectomy; VH Vitreous hemorrhage; PRP panretinal laser photocoagulation; IVK intravitreal kenalog; VMT vitreomacular traction; MH Macular hole;  NVD neovascularization of the disc; NVE neovascularization elsewhere; AREDS age related eye disease study; ARMD age related macular degeneration; POAG primary open angle glaucoma; EBMD epithelial/anterior basement membrane dystrophy; ACIOL anterior chamber intraocular lens; IOL intraocular lens; PCIOL posterior chamber intraocular lens; Phaco/IOL phacoemulsification with intraocular lens placement; PRK photorefractive keratectomy; LASIK laser assisted in situ keratomileusis; HTN hypertension; DM diabetes mellitus; COPD chronic obstructive pulmonary disease

## 2023-07-27 ENCOUNTER — Encounter (INDEPENDENT_AMBULATORY_CARE_PROVIDER_SITE_OTHER): Payer: Medicare Other | Admitting: Ophthalmology

## 2023-07-28 ENCOUNTER — Ambulatory Visit (INDEPENDENT_AMBULATORY_CARE_PROVIDER_SITE_OTHER): Payer: Medicare Other | Admitting: Ophthalmology

## 2023-07-28 ENCOUNTER — Encounter (INDEPENDENT_AMBULATORY_CARE_PROVIDER_SITE_OTHER): Payer: Self-pay | Admitting: Ophthalmology

## 2023-07-28 DIAGNOSIS — H353231 Exudative age-related macular degeneration, bilateral, with active choroidal neovascularization: Secondary | ICD-10-CM | POA: Diagnosis not present

## 2023-07-28 DIAGNOSIS — Z961 Presence of intraocular lens: Secondary | ICD-10-CM | POA: Diagnosis not present

## 2023-07-28 DIAGNOSIS — H353221 Exudative age-related macular degeneration, left eye, with active choroidal neovascularization: Secondary | ICD-10-CM

## 2023-07-28 DIAGNOSIS — H43823 Vitreomacular adhesion, bilateral: Secondary | ICD-10-CM

## 2023-07-28 DIAGNOSIS — H353211 Exudative age-related macular degeneration, right eye, with active choroidal neovascularization: Secondary | ICD-10-CM

## 2023-07-28 MED ORDER — AFLIBERCEPT 2MG/0.05ML IZ SOLN FOR KALEIDOSCOPE
2.0000 mg | INTRAVITREAL | Status: AC | PRN
Start: 2023-07-28 — End: 2023-07-28
  Administered 2023-07-28: 2 mg via INTRAVITREAL

## 2023-08-25 NOTE — Progress Notes (Signed)
[facility] - Clinic Note  09/07/2023     CHIEF COMPLAINT Patient presents for Retina Follow Up    HISTORY OF PRESENT ILLNESS: Lindsay Harvey is a 87 y.o. 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  This started weeks ago.  Duration of 6 weeks.  Since onset it is stable.  I, the attending physician,  performed the HPI with the patient and updated documentation appropriately.        Comments   Patient states she is not having any problems with her vision. She is using AT's.      Last edited by Rennis Chris, MD on 09/07/2023  1:02 PM.    Pt states vision is stable   Referring physician: Ralene Ok, MD [id number] [facility] [address] [place],  [place] [address]  HISTORICAL INFORMATION:   Selected notes from the MEDICAL RECORD NUMBER Referred by Dr. Marcha Solders for concern of AMD OU, CNV   CURRENT MEDICATIONS: Current Outpatient Medications (Ophthalmic Drugs)  Medication Sig   carboxymethylcellulose (REFRESH PLUS) 0.5 % SOLN Place 1 drop into both eyes 3 (three) times daily as needed (dry/irritated eyes).    prednisoLONE acetate (PRED FORTE) 1 % ophthalmic suspension Place 1 drop into the right eye 4 (four) times daily.   REFRESH OPTIVE MEGA-3 0.5-1-0.5 % SOLN Place 1 drop into both eyes 3 (three) times daily as needed (discomfort/eye irritation.).   No current facility-administered medications for this visit. (Ophthalmic Drugs)   Current Outpatient Medications (Other)  Medication Sig   amLODipine (NORVASC) 5 MG tablet TAKE 1 TABLET BY MOUTH DAILY   aspirin EC 81 MG tablet Take 1 tablet (81 mg total) by mouth daily. Swallow whole.   calcium carbonate (OSCAL) 1500 (600 Ca) MG TABS tablet Take 600 mg of elemental calcium by mouth 2 (two) times a day.   Cholecalciferol (VITAMIN D3) 50 MCG (2000 UT) TABS Take 2,000 Units by mouth daily.   nitroGLYCERIN (NITROSTAT) 0.4 MG SL tablet Place 1  tablet (0.4 mg total) under the tongue every 5 (five) minutes as needed for chest pain.   simvastatin (ZOCOR) 40 MG tablet Take 20 mg by mouth every evening. 1/4 tablet at night   Current Facility-Administered Medications (Other)  Medication Route   Bevacizumab (AVASTIN) SOLN 1.25 mg Intravitreal   Bevacizumab (AVASTIN) SOLN 1.25 mg Intravitreal   Bevacizumab (AVASTIN) SOLN 1.25 mg Intravitreal   Bevacizumab (AVASTIN) SOLN 1.25 mg Intravitreal   Bevacizumab (AVASTIN) SOLN 1.25 mg Intravitreal   Bevacizumab (AVASTIN) SOLN 1.25 mg Intravitreal   Bevacizumab (AVASTIN) SOLN 1.25 mg Intravitreal   Bevacizumab (AVASTIN) SOLN 1.25 mg Intravitreal   Bevacizumab (AVASTIN) SOLN 1.25 mg Intravitreal   Bevacizumab (AVASTIN) SOLN 1.25 mg Intravitreal   Bevacizumab (AVASTIN) SOLN 1.25 mg Intravitreal   Bevacizumab (AVASTIN) SOLN 1.25 mg Intravitreal   Bevacizumab (AVASTIN) SOLN 1.25 mg Intravitreal   REVIEW OF SYSTEMS: ROS   Positive for: Gastrointestinal, Eyes Negative for: Constitutional, Neurological, Skin, Genitourinary, Musculoskeletal, HENT, Endocrine, Cardiovascular, Respiratory, Psychiatric, Allergic/Imm, Heme/Lymph Last edited by Charlette Caffey, COT on 09/07/2023  8:24 AM.     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   Headache    Hypercholesterolemia    Macular degeneration  Wet OU   Wears glasses    Past Surgical History:  Procedure Laterality Date   25 GAUGE PARS PLANA VITRECTOMY WITH 20 GAUGE MVR PORT FOR MACULAR HOLE Right 04/07/2019   Procedure: 25 GAUGE PARS PLANA VITRECTOMY WITH 20 GAUGE MVR PORT FOR MACULAR HOLE;  Surgeon: Rennis Chris, MD;  Location: [facility] OR;  Service: Ophthalmology;  Laterality: Right;   CATARACT EXTRACTION Bilateral    2013   DILATION AND CURETTAGE OF UTERUS     EYE SURGERY Bilateral 2013   Cat Sx   GAS/FLUID  EXCHANGE Right 04/07/2019   Procedure: Gas/Fluid Exchange;  Surgeon: Rennis Chris, MD;  Location: [facility] OR;  Service: Ophthalmology;  Laterality: Right;   MEMBRANE PEEL Right 04/07/2019   Procedure: Eula Flax;  Surgeon: Rennis Chris, MD;  Location: [facility] OR;  Service: Ophthalmology;  Laterality: Right;   PHOTOCOAGULATION WITH LASER Right 04/07/2019   Procedure: Photocoagulation With Laser;  Surgeon: Rennis Chris, MD;  Location: [facility] OR;  Service: Ophthalmology;  Laterality: Right;   YAG LASER APPLICATION Bilateral    FAMILY HISTORY Family History  Problem Relation Age of Onset   Stroke Father    SOCIAL HISTORY Social History   Tobacco Use   Smoking status: Never   Smokeless tobacco: Never  Vaping Use   Vaping status: Never Used  Substance Use Topics   Alcohol use: No   Drug use: No       OPHTHALMIC EXAM: Base Eye Exam     Visual Acuity (Snellen - Linear)       Right Left   Dist [place] 20/80 20/50 +2   Dist ph [place] 20/60 NI         Tonometry (Tonopen, 8:28 AM)       Right Left   Pressure 11 9         Pupils       Dark Light Shape React APD   Right 3 2 Round Brisk None   Left 3 2 Round Brisk None         Visual Fields       Left Right    Full Full         Extraocular Movement       Right Left    Full, Ortho Full, Ortho         Neuro/Psych     Oriented x3: Yes   Mood/Affect: Normal         Dilation     Both eyes: 1.0% Mydriacyl, 2.5% Phenylephrine @ 8:25 AM           Slit Lamp and Fundus Exam     Slit Lamp Exam       Right Left   Lids/Lashes Dermatochalasis - upper lid, periorbital edema Dermatochalasis - upper lid   Conjunctiva/Sclera White and quiet White and quiet   Cornea Arcus, 1+ fine Punctate epithelial erosions, mild endo pigment Arcus, 3+ Punctate epithelial erosions, mild tear film debris, mild endo pigment, dry tear fil, decreased TBUT   Anterior Chamber Deep, quiet Deep and quiet   Iris Round and dilated Round and  dilated   Lens PC IOL in good postion with anterior capsular phimosis, 1+ Posterior capsular opacification PC IOL in good postion with anterior capsular phimosis, 1+ Posterior capsular opacification sparing center   Anterior Vitreous post vitrectomy Vitreous syneresis         Fundus Exam       Right Left   Disc Sharp rim, mild pallor, peripapillary drusen, PPP  Pink and Sharp, central pallor, +cupping, +PPP, peripapillary drusen   C/D Ratio 0.5 0.75   Macula flat; ERM/VMT gone, blunted foveal reflex, refractile drusen, trace cystic changes -- stably improved, pigment clumping, focal IRH centrally - stably resolved, +atrophy, no heme Blunted foveal reflex, refractile Drusen, early Atrophy, RPE mottling and clumping, punctate CNV / IRH nasal macula with cystic changes -- stably improved   Vessels attenuated, mild tortuosity attenuated, Tortuous   Periphery Attached, scattered peripheral drusen, peripheral laser changes superiorly Attached with peripheral drusen           IMAGING AND PROCEDURES  Imaging and Procedures for 02/09/18  OCT, Retina - OU - Both Eyes       Right Eye Quality was good. Central Foveal Thickness: 199. Progression has been stable. Findings include no IRF, no SRF, abnormal foveal contour, retinal drusen , subretinal hyper-reflective material, intraretinal hyper-reflective material, pigment epithelial detachment, outer retinal atrophy (stable improvement in IRF and SRHM temporal macula, stable improved in IRF / PED IN macula).   Left Eye Quality was good. Central Foveal Thickness: 226. Progression has been stable. Findings include no SRF, abnormal foveal contour, retinal drusen , subretinal hyper-reflective material, epiretinal membrane, intraretinal fluid, pigment epithelial detachment, vitreous traction, outer retinal atrophy (stable improvement in IRF / PED nasal macula, persistent VMT w/ cystic changes, stable improvement in low PED / [facility] inferior macula).    Notes Images taken, stored on drive  Diagnosis / Impression:  OD: Exudative ARMD -- stable improvement in IRF and SRHM temporal macula, stable improvement in IRF / PED IN macula OS: exudative ARMD -- stable improvement in IRF / PED nasal macula, persistent VMT w/ cystic changes, stable improvement in low PED / [facility] inferior macula  Clinical management:  See below  Abbreviations: NFP - Normal foveal profile. CME - cystoid macular edema. PED - pigment epithelial detachment. IRF - intraretinal fluid. SRF - subretinal fluid. EZ - ellipsoid zone. ERM - epiretinal membrane. ORA - outer retinal atrophy. ORT - outer retinal tubulation. SRHM - subretinal hyper-reflective material       Intravitreal Injection, Pharmacologic Agent - OD - Right Eye       Time Out 09/07/2023. 9:02 AM. Confirmed correct patient, procedure, site, and patient consented.   Anesthesia Topical anesthesia was used. Anesthetic medications included Lidocaine 2%, Proparacaine 0.5%.   Procedure Preparation included 5% betadine to ocular surface, eyelid speculum. A (32g) needle was used.   Injection: 2 mg aflibercept 2 MG/0.05ML   Route: Intravitreal, Site: Right Eye   NDC: L6038910, Lot: [id number], Expiration date: 01/09/2025, Waste: 0 mL   Post-op Post injection exam found visual acuity of at least counting fingers. The patient tolerated the procedure well. There were no complications. The patient received written and verbal post procedure care education. Post injection medications were not given.      Intravitreal Injection, Pharmacologic Agent - OS - Left Eye       Time Out 09/07/2023. 9:02 AM. Confirmed correct patient, procedure, site, and patient consented.   Anesthesia Topical anesthesia was used. Anesthetic medications included Lidocaine 2%, Proparacaine 0.5%.   Procedure Preparation included 5% betadine to ocular surface, eyelid speculum. A (32g) needle was used.   Injection: 2 mg  aflibercept 2 MG/0.05ML   Route: Intravitreal, Site: Left Eye   NDC: L6038910, Lot: [id number], Expiration date: 10/12/2024, Waste: 0 mL   Post-op Post injection exam found visual acuity of at least counting fingers. The patient tolerated the procedure well. There were no complications. The  patient received written and verbal post procedure care education. Post injection medications were not given.            ASSESSMENT/PLAN:    ICD-10-CM   1. 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 Exudative age-related macular degeneration of left eye with active choroidal neovascularization (HCC)  H35.3221 OCT, Retina - OU - Both Eyes    Intravitreal Injection, Pharmacologic Agent - OS - Left Eye    aflibercept (EYLEA) SOLN 2 mg    3. 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 Pseudophakia of both eyes  Z96.1      1. 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  - S/P IVA OD #1 (12.03.18), #2 (01.02.19), #3 (01.30.19), #4 (03.01.19), #5 (04.01.19), #6 (04.30.19), #7 (06.05.19), #8 (07.19.19), #9 (08.30.19), #10 (10.14.19), #11 (11.18.19), #12 (12.30.19), #13 (02.10.20), #14 (04.06.20), #15 (09.17.20), #16 (10.15.20), #17 (12.01.20), #18 (01.18.21), #19 (03.15.21), #20 (5.11.21), #21 (07.20.21), #22 (10.14.21), #23 (01.10.22), #24 (03.07.22), #24 (05.23.22), #25 (9.1.22), #26 (11.01.22), #27 (12.28.22), # 28 (02.14.23), #29 (03.28.23) -- IVA resistance  - s/p IVE OD #1 (05.09.23) sample, #2 (06.14.23), #3 (07.12.23), #4 (08.22.23), #5 (10.03.23)., #6 (11.21.23), #7 (01.09.24), #8 (02.27.24), #9 (04.23.24), #10 (06.17.24), #11 (08.12.24), #12 (09.09.24), #13 (10.15.24)  - FA (04.01.19) shows mild CNVM OD consistent with exudative ARMD  - OCT  shows OD: stable improvement in IRF and SRHM temporal macula, stable improved in IRF / PED IN macula at 6 weeks  - BCVA OD stable at 20/60   - recommend IVE OD #14 today, 11.25.24 w/ f/u extended to 8 wks  - pt wishes to proceed with Eylea  - RBA of procedure discussed, questions answered  - see procedure note  - Eylea informed consent form signed and scanned on 08.12.24 (OU)  - f/u 8 weeks -- DFE/OCT/possible injection  2. Age related macular degeneration, exudative, OS  - interval conversion from non-exudative to exudative noted 07.20.21  - s/p IVA OS #1 (07.20.21), #2 (08.19.21), #3 (09.16.21), #4 (10.14.21), #5 (11.29.21), #6 (01.10.22), #7 (03.07.22), #8 (05.23.22), #9 (09.01.22), #10 (03.28.23), #11 (05.09.23), #12 (06.14.23), #13 (07.12.23), #14 (08.22.23), #15 (10.03.23), #16 (11.21.23), #17 (01.09.24), #18 (02.27.24) -- IVA resistance  - s/p IVE OS #1 (08.12.24), #2 (09.09.24), #3 (10.15.24)  - exam shows no heme  - OCT with OS: stable improvement in IRF / PED nasal macula, persistent VMT w/ cystic changes, stable improvement in low PED / [facility] inferior macula at 6 wks  - BCVA OS 20/50 from 20/60  - recommend IVE OS #4 today, 11.25.24 w/ f/u extended to 8 wks  - pt wishes to proceed with injection  - RBA of procedure discussed, questions answered - IVE informed consent obtained and signed, 08.12.24 (OU) - see procedure note - f/u 8 weeks DFE, OCT  3. 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 Pseudophakia OU  - s/p CE/IOL OU  - s/p YAG OU  - beautiful surgeries, doing well  - continue to monitor  Ophthalmic Meds Ordered this visit:  Meds ordered this  encounter  Medications   aflibercept (EYLEA) SOLN 2 mg   aflibercept (EYLEA) SOLN 2 mg     Return in about 8 weeks (around 11/02/2023) for f/u exu ARMD OU, DFE, OCT.  There are no Patient Instructions on file for this visit.  This document serves as a record of services personally performed by Karie Chimera, MD, PhD. It was created on their behalf by Glee Arvin. Manson Passey, OA an ophthalmic technician. The creation of this record is the provider's dictation and/or activities during the visit.    Electronically signed by: Glee Arvin. Manson Passey, OA 09/08/23 5:47 PM  Karie Chimera, M.D., Ph.D. Diseases & Surgery of the Retina and Vitreous [facility]  I have reviewed the above documentation for accuracy and completeness, and I agree with the above. Karie Chimera, M.D., Ph.D. 09/08/23 5:49 PM  Abbreviations: M myopia (nearsighted); A astigmatism; H hyperopia (farsighted); P presbyopia; Mrx spectacle prescription;  CTL contact lenses; OD right eye; OS left eye; OU both eyes  XT exotropia; ET esotropia; PEK punctate epithelial keratitis; PEE punctate epithelial erosions; DES dry eye syndrome; MGD meibomian gland dysfunction; ATs artificial tears; PFAT's preservative free artificial tears; NSC nuclear sclerotic cataract; PSC posterior subcapsular cataract; ERM epi-retinal membrane; PVD posterior vitreous detachment; RD retinal detachment; DM diabetes mellitus; DR diabetic retinopathy; NPDR non-proliferative diabetic retinopathy; PDR proliferative diabetic retinopathy; CSME clinically significant macular edema; DME diabetic macular edema; dbh dot blot hemorrhages; CWS cotton wool spot; POAG primary open angle glaucoma; C/D cup-to-disc ratio; HVF humphrey visual field; GVF goldmann visual field; OCT optical coherence tomography; IOP intraocular pressure; BRVO Branch retinal vein occlusion; CRVO central retinal vein occlusion; CRAO central retinal artery occlusion; BRAO branch retinal  artery occlusion; RT retinal tear; SB scleral buckle; PPV pars plana vitrectomy; VH Vitreous hemorrhage; PRP panretinal laser photocoagulation; IVK intravitreal kenalog; VMT vitreomacular traction; MH Macular hole;  NVD neovascularization of the disc; NVE neovascularization elsewhere; AREDS age related eye disease study; ARMD age related macular degeneration; POAG primary open angle glaucoma; EBMD epithelial/anterior basement membrane dystrophy; ACIOL anterior chamber intraocular lens; IOL intraocular lens; PCIOL posterior chamber intraocular lens; Phaco/IOL phacoemulsification with intraocular lens placement; PRK photorefractive keratectomy; LASIK laser assisted in situ keratomileusis; HTN hypertension; DM diabetes mellitus; COPD chronic obstructive pulmonary disease

## 2023-09-07 ENCOUNTER — Encounter (INDEPENDENT_AMBULATORY_CARE_PROVIDER_SITE_OTHER): Payer: Self-pay | Admitting: Ophthalmology

## 2023-09-07 ENCOUNTER — Ambulatory Visit (INDEPENDENT_AMBULATORY_CARE_PROVIDER_SITE_OTHER): Payer: Medicare Other | Admitting: Ophthalmology

## 2023-09-07 DIAGNOSIS — Z961 Presence of intraocular lens: Secondary | ICD-10-CM

## 2023-09-07 DIAGNOSIS — H43823 Vitreomacular adhesion, bilateral: Secondary | ICD-10-CM | POA: Diagnosis not present

## 2023-09-07 DIAGNOSIS — H353211 Exudative age-related macular degeneration, right eye, with active choroidal neovascularization: Secondary | ICD-10-CM

## 2023-09-07 DIAGNOSIS — H353221 Exudative age-related macular degeneration, left eye, with active choroidal neovascularization: Secondary | ICD-10-CM

## 2023-09-07 DIAGNOSIS — H353231 Exudative age-related macular degeneration, bilateral, with active choroidal neovascularization: Secondary | ICD-10-CM | POA: Diagnosis not present

## 2023-09-07 MED ORDER — AFLIBERCEPT 2MG/0.05ML IZ SOLN FOR KALEIDOSCOPE
2.0000 mg | INTRAVITREAL | Status: AC | PRN
  Administered 2023-09-07: 2 mg via INTRAVITREAL

## 2023-09-13 IMAGING — CT CT CARDIAC CORONARY ARTERY CALCIUM SCORE
2 of 3 series · 12 of 20 positions shown, 15 images · non-contrast
Comparison: None Available.

CLINICAL DATA: 86-year-old Asian female with history of
hypertension, hyperlipidemia and left-sided chest pain.

EXAM:
CT CARDIAC CORONARY ARTERY CALCIUM SCORE
TECHNIQUE: Non-contrast imaging through the heart was performed using
prospective ECG gating. Image post processing was performed on an
independent workstation, allowing for quantitative analysis of the
heart and coronary arteries. Note that this exam targets the heart
and the chest was not imaged in its entirety.

[Series 2: cascseq 2.0 d10f 70% · axial · 0.33mm/px · z∈[-1,+83]mm · 3 of 86 slices shown]
[im 22/86  vessel]
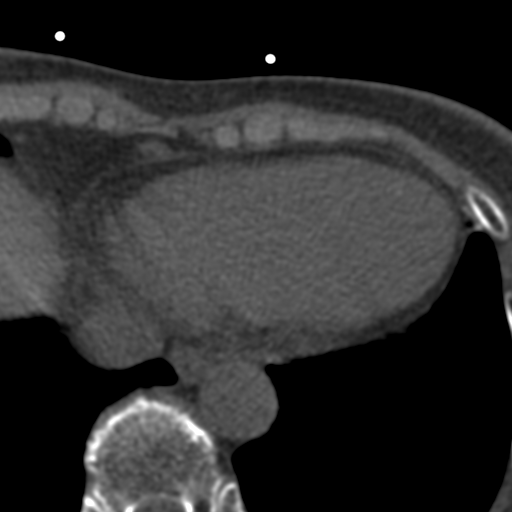
[im 43/86  vessel]
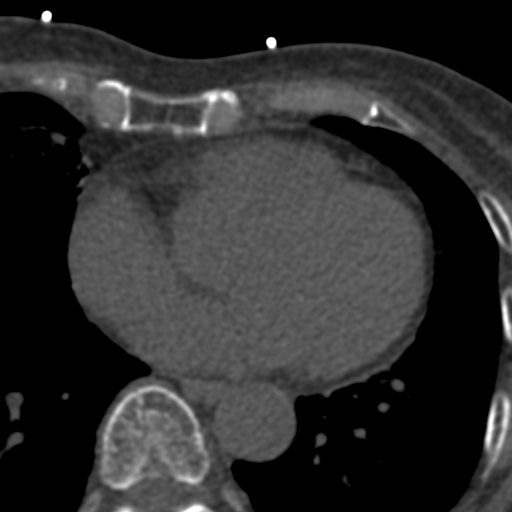
[im 64/86  vessel]
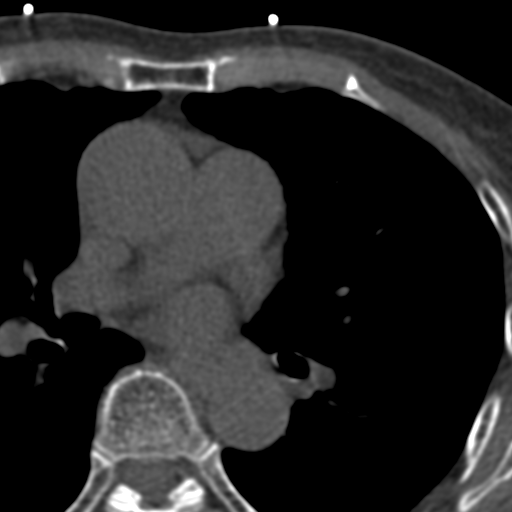

[Series 3: thin sfov · axial · 0.33mm/px · z∈[-26,+110]mm · 9 of 172 slices shown, 12 images]
[im 18/172  vessel]
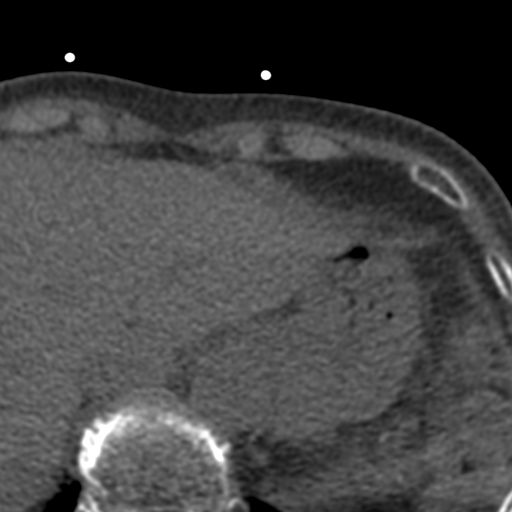
[im 18/172  lung]
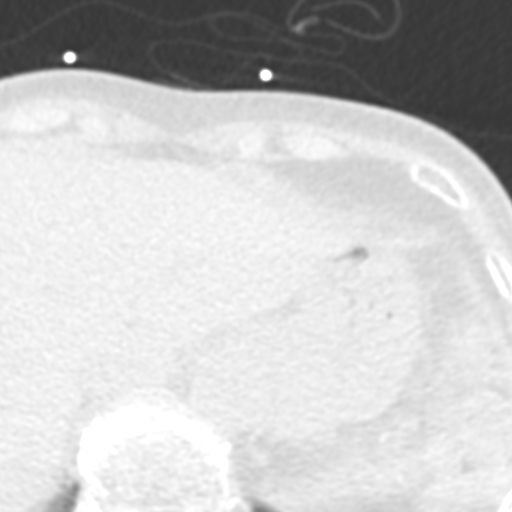
[im 35/172  vessel]
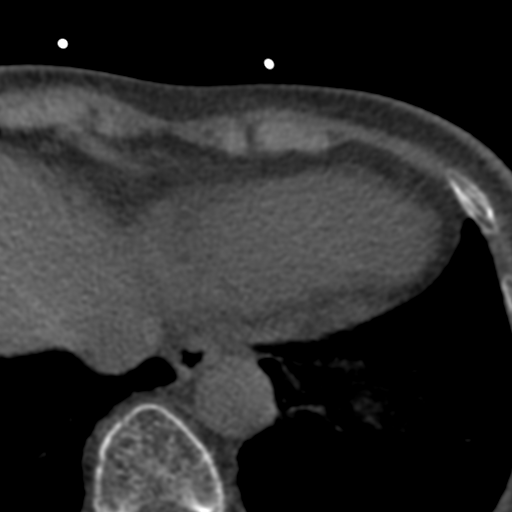
[im 52/172  vessel]
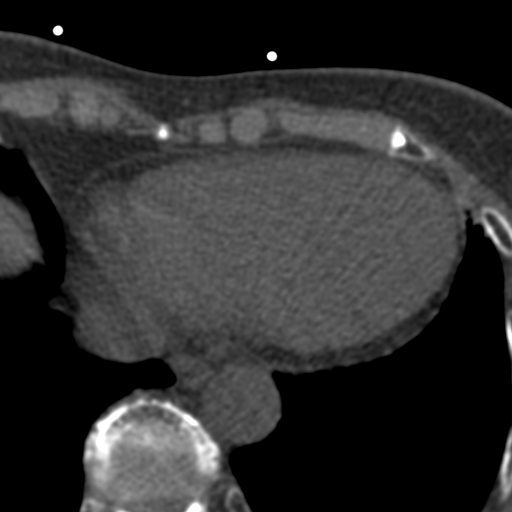
[im 69/172  vessel]
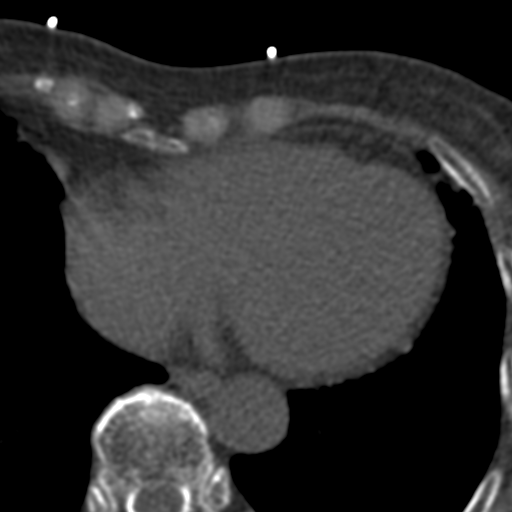
[im 86/172  vessel]
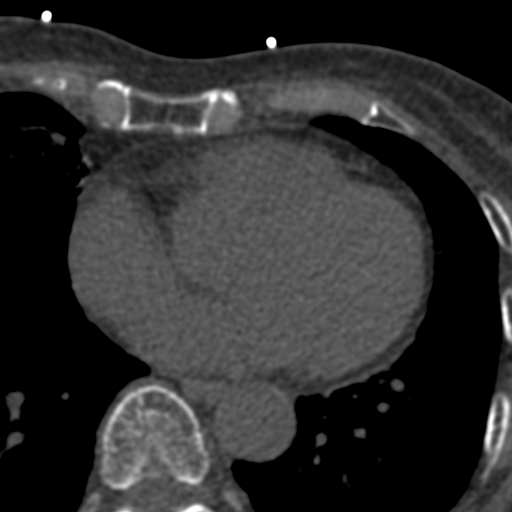
[im 86/172  lung]
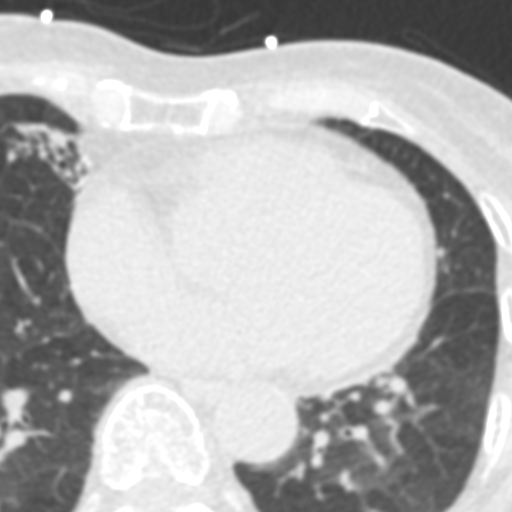
[im 103/172  vessel]
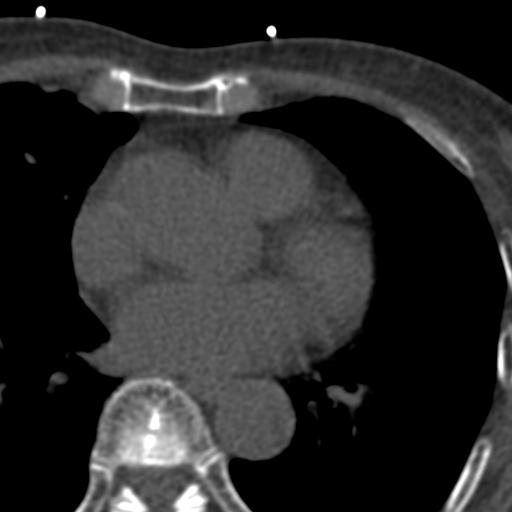
[im 120/172  vessel]
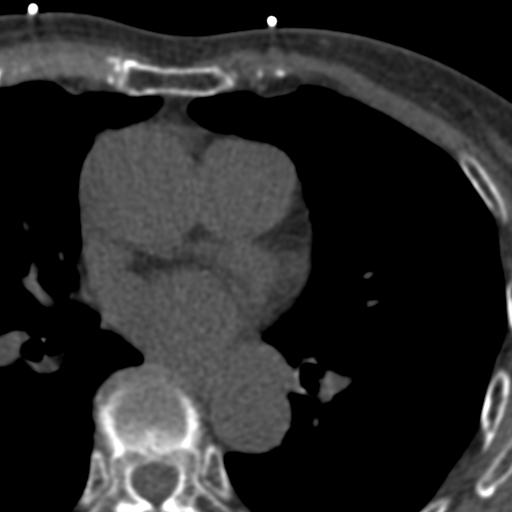
[im 137/172  vessel]
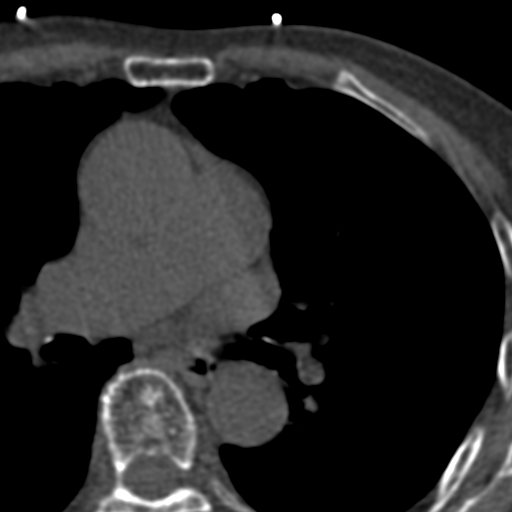
[im 154/172  vessel]
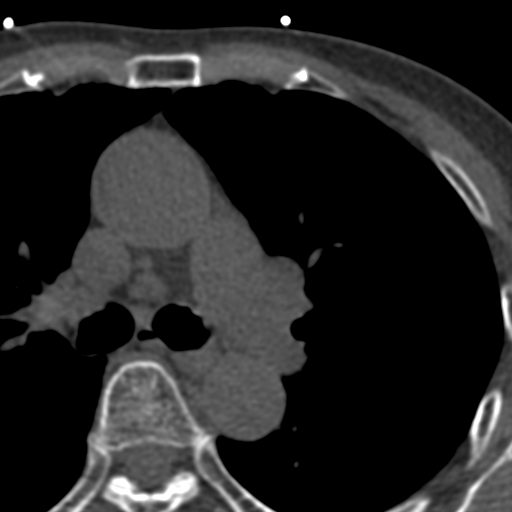
[im 154/172  lung]
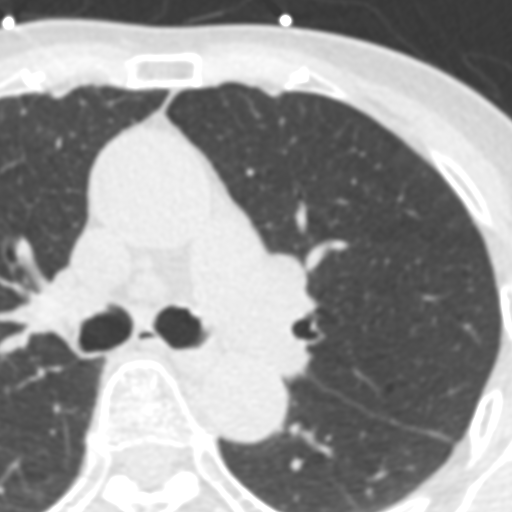

[12 of 20 positions shown; findings below may reference images not displayed]

FINDINGS: CORONARY CALCIUM SCORES:

Left Main: 0

LAD:

LCx: 0

RCA: 0

Total Agatston Score:

[HOSPITAL] percentile: 21

AORTA MEASUREMENTS:

Ascending Aorta: 38 mm

Descending Aorta: 26 mm

OTHER FINDINGS:

The heart size is within normal limits. No pericardial fluid is
identified. Visualized segments of the thoracic aorta and central
pulmonary arteries are normal in caliber. Visualized mediastinum and
hilar regions demonstrate no lymphadenopathy or masses. There is a
calcified granuloma in the right upper lobe abutting the minor
fissure. Small calcified granuloma also present in the lingula.
Lungs demonstrate scattered areas of parenchymal scarring in the
right lung and an area scarring and volume loss in the right middle
lobe anteriorly. Visualized lungs show no evidence of pulmonary
edema, consolidation, pneumothorax or pleural fluid. Visualized
upper abdomen and bony structures are unremarkable.
IMPRESSION: 1. Coronary calcium score 3.7 is at the 21st percentile for the
patient's age, sex and race.
2. Evidence of prior granulomatous disease as well as
postinflammatory scarring in the lungs, especially in the right
lung.

## 2023-10-27 NOTE — Progress Notes (Addendum)
[facility] - Clinic Note  11/02/2023     CHIEF COMPLAINT Patient presents for Retina Follow Up    HISTORY OF PRESENT ILLNESS: Lindsay Harvey is a 88 y.o. 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  This started 8 weeks ago.  Duration of 8 weeks.  Since onset it is stable.  I, the attending physician,  performed the HPI with the patient and updated documentation appropriately.        Comments   8 week retina follow up ARMD OU and IVE OU pt is reporting no vision changes noticed she denies any flashes or floaters       Last edited by Rennis Chris, MD on 11/02/2023 10:50 AM.     Pt states vision is stable   Referring physician: Ralene Ok, MD [address] [place],  [place] [address]  HISTORICAL INFORMATION:   Selected notes from the MEDICAL RECORD NUMBER Referred by Dr. Marcha Solders for concern of AMD OU, CNV   CURRENT MEDICATIONS: Current Outpatient Medications (Ophthalmic Drugs)  Medication Sig   carboxymethylcellulose (REFRESH PLUS) 0.5 % SOLN Place 1 drop into both eyes 3 (three) times daily as needed (dry/irritated eyes).    prednisoLONE acetate (PRED FORTE) 1 % ophthalmic suspension Place 1 drop into the right eye 4 (four) times daily.   REFRESH OPTIVE MEGA-3 0.5-1-0.5 % SOLN Place 1 drop into both eyes 3 (three) times daily as needed (discomfort/eye irritation.).   No current facility-administered medications for this visit. (Ophthalmic Drugs)   Current Outpatient Medications (Other)  Medication Sig   amLODipine (NORVASC) 5 MG tablet TAKE 1 TABLET BY MOUTH DAILY   aspirin EC 81 MG tablet Take 1 tablet (81 mg total) by mouth daily. Swallow whole.   calcium carbonate (OSCAL) 1500 (600 Ca) MG TABS tablet Take 600 mg of elemental calcium by mouth 2 (two) times a day.   Cholecalciferol (VITAMIN D3) 50 MCG (2000 UT) TABS Take 2,000 Units by mouth daily.   nitroGLYCERIN  (NITROSTAT) 0.4 MG SL tablet Place 1 tablet (0.4 mg total) under the tongue every 5 (five) minutes as needed for chest pain.   simvastatin (ZOCOR) 40 MG tablet Take 20 mg by mouth every evening. 1/4 tablet at night   Current Facility-Administered Medications (Other)  Medication Route   Bevacizumab (AVASTIN) SOLN 1.25 mg Intravitreal   Bevacizumab (AVASTIN) SOLN 1.25 mg Intravitreal   Bevacizumab (AVASTIN) SOLN 1.25 mg Intravitreal   Bevacizumab (AVASTIN) SOLN 1.25 mg Intravitreal   Bevacizumab (AVASTIN) SOLN 1.25 mg Intravitreal   Bevacizumab (AVASTIN) SOLN 1.25 mg Intravitreal   Bevacizumab (AVASTIN) SOLN 1.25 mg Intravitreal   Bevacizumab (AVASTIN) SOLN 1.25 mg Intravitreal   Bevacizumab (AVASTIN) SOLN 1.25 mg Intravitreal   Bevacizumab (AVASTIN) SOLN 1.25 mg Intravitreal   Bevacizumab (AVASTIN) SOLN 1.25 mg Intravitreal   Bevacizumab (AVASTIN) SOLN 1.25 mg Intravitreal   Bevacizumab (AVASTIN) SOLN 1.25 mg Intravitreal   REVIEW OF SYSTEMS: ROS   Positive for: Gastrointestinal, Eyes Negative for: Constitutional, Neurological, Skin, Genitourinary, Musculoskeletal, HENT, Endocrine, Cardiovascular, Respiratory, Psychiatric, Allergic/Imm, Heme/Lymph Last edited by Etheleen Mayhew, COT on 11/02/2023  8:31 AM.      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  Headache    Hypercholesterolemia    Macular degeneration    Wet OU   Wears glasses    Past Surgical History:  Procedure Laterality Date   25 GAUGE PARS PLANA VITRECTOMY WITH 20 GAUGE MVR PORT FOR MACULAR HOLE Right 04/07/2019   Procedure: 25 GAUGE PARS PLANA VITRECTOMY WITH 20 GAUGE MVR PORT FOR MACULAR HOLE;  Surgeon: Rennis Chris, MD;  Location: [facility] OR;  Service: Ophthalmology;  Laterality: Right;   CATARACT EXTRACTION Bilateral    2013   DILATION AND CURETTAGE OF UTERUS     EYE SURGERY  Bilateral 2013   Cat Sx   GAS/FLUID EXCHANGE Right 04/07/2019   Procedure: Gas/Fluid Exchange;  Surgeon: Rennis Chris, MD;  Location: [facility] OR;  Service: Ophthalmology;  Laterality: Right;   MEMBRANE PEEL Right 04/07/2019   Procedure: Eula Flax;  Surgeon: Rennis Chris, MD;  Location: [facility] OR;  Service: Ophthalmology;  Laterality: Right;   PHOTOCOAGULATION WITH LASER Right 04/07/2019   Procedure: Photocoagulation With Laser;  Surgeon: Rennis Chris, MD;  Location: [facility] OR;  Service: Ophthalmology;  Laterality: Right;   YAG LASER APPLICATION Bilateral    FAMILY HISTORY Family History  Problem Relation Age of Onset   Stroke Father    SOCIAL HISTORY Social History   Tobacco Use   Smoking status: Never   Smokeless tobacco: Never  Vaping Use   Vaping status: Never Used  Substance Use Topics   Alcohol use: No   Drug use: No       OPHTHALMIC EXAM: Base Eye Exam     Visual Acuity (Snellen - Linear)       Right Left   Dist [place] 20/80 20/60 -2   Dist ph  20/60 NI         Tonometry (Tonopen, 8:37 AM)       Right Left   Pressure 14 15         Pupils       Pupils Dark Light Shape React APD   Right PERRL 3 2 Round Brisk None   Left PERRL 3 2 Round Brisk None         Visual Fields       Left Right    Full Full         Extraocular Movement       Right Left    Full, Ortho Full, Ortho         Neuro/Psych     Oriented x3: Yes   Mood/Affect: Normal         Dilation     Both eyes: 2.5% Phenylephrine @ 8:37 AM           Slit Lamp and Fundus Exam     Slit Lamp Exam       Right Left   Lids/Lashes Dermatochalasis - upper lid, periorbital edema Dermatochalasis - upper lid   Conjunctiva/Sclera White and quiet White and quiet   Cornea Arcus, 1+ fine Punctate epithelial erosions, mild endo pigment Arcus, 3+ Punctate epithelial erosions, mild tear film debris, mild endo pigment, dry tear fil, decreased TBUT   Anterior Chamber Deep, quiet Deep and quiet    Iris Round and dilated Round and dilated   Lens PC IOL in good postion with anterior capsular phimosis, 1+ Posterior capsular opacification PC IOL in good postion with anterior capsular phimosis, 1+ Posterior capsular opacification sparing center   Anterior Vitreous post vitrectomy Vitreous syneresis         Fundus Exam  Right Left   Disc Sharp rim, mild pallor, peripapillary drusen, PPP Pink and Sharp, central pallor, +cupping, +PPP, peripapillary drusen   C/D Ratio 0.5 0.75   Macula flat; ERM/VMT gone, blunted foveal reflex, refractile drusen, trace cystic changes -- stably improved, pigment clumping, focal IRH centrally - stably resolved, scattered atrophy, no heme Blunted foveal reflex, refractile Drusen, early Atrophy, RPE mottling and clumping, punctate CNV / IRH nasal macula with cystic changes -- stably improved   Vessels attenuated, mild tortuosity attenuated, Tortuous   Periphery Attached, scattered peripheral drusen, peripheral laser changes superiorly Attached with peripheral drusen           IMAGING AND PROCEDURES  Imaging and Procedures for 02/09/18  OCT, Retina - OU - Both Eyes       Right Eye Quality was good. Central Foveal Thickness: 199. Progression has worsened. Findings include no IRF, no SRF, abnormal foveal contour, retinal drusen , subretinal hyper-reflective material, intraretinal hyper-reflective material, pigment epithelial detachment, outer retinal atrophy (stable improvement in IRF and SRHM temporal macula, mild interval increase in IRF / PED IN macula).   Left Eye Quality was good. Central Foveal Thickness: 218. Progression has been stable. Findings include no SRF, abnormal foveal contour, retinal drusen , subretinal hyper-reflective material, epiretinal membrane, intraretinal fluid, pigment epithelial detachment, vitreous traction, outer retinal atrophy (stable improvement in IRF / PED nasal macula, persistent VMT w/ cystic changes -- improved,  low PED / [facility] inferior macula).   Notes Images taken, stored on drive  Diagnosis / Impression:  OD: Exudative ARMD -- stable improvement in IRF and SRHM temporal macula, mild interval increase in IRF / PED IN macula OS: exudative ARMD -- stable improvement in IRF / PED nasal macula, persistent VMT w/ cystic changes -- improved, low PED / [facility] inferior macula  Clinical management:  See below  Abbreviations: NFP - Normal foveal profile. CME - cystoid macular edema. PED - pigment epithelial detachment. IRF - intraretinal fluid. SRF - subretinal fluid. EZ - ellipsoid zone. ERM - epiretinal membrane. ORA - outer retinal atrophy. ORT - outer retinal tubulation. SRHM - subretinal hyper-reflective material       Intravitreal Injection, Pharmacologic Agent - OD - Right Eye       Time Out 11/02/2023. 9:01 AM. Confirmed correct patient, procedure, site, and patient consented.   Anesthesia Topical anesthesia was used. Anesthetic medications included Lidocaine 2%, Proparacaine 0.5%.   Procedure Preparation included 5% betadine to ocular surface, eyelid speculum. A supplied (32g) needle was used.   Injection: 1.25 mg Bevacizumab 1.25mg /0.91ml   Route: Intravitreal, Site: Right Eye   NDC: P3213405, Lot: [id number], Expiration date: 11/26/2023   Post-op Post injection exam found visual acuity of at least counting fingers. The patient tolerated the procedure well. There were no complications. The patient received written and verbal post procedure care education. Post injection medications were not given.      Intravitreal Injection, Pharmacologic Agent - OS - Left Eye       Time Out 11/02/2023. 9:03 AM. Confirmed correct patient, procedure, site, and patient consented.   Anesthesia Topical anesthesia was used. Anesthetic medications included Lidocaine 2%, Proparacaine 0.5%.   Procedure Preparation included 5% betadine to ocular surface, eyelid speculum. A (32g) needle was used.    Injection: 1.25 mg Bevacizumab 1.25mg /0.18ml   Route: Intravitreal, Site: Left Eye   NDC: P3213405, Lot: [id number], Expiration date: 12/31/2023   Post-op Post injection exam found visual acuity of at least counting fingers. The patient tolerated the procedure well.  There were no complications. The patient received written and verbal post procedure care education. Post injection medications were not given.            ASSESSMENT/PLAN:    ICD-10-CM   1. Exudative age-related macular degeneration of right eye with active choroidal neovascularization (HCC)  H35.3211 OCT, Retina - OU - Both Eyes    Intravitreal Injection, Pharmacologic Agent - OD - Right Eye    Bevacizumab (AVASTIN) SOLN 1.25 mg    2. Exudative age-related macular degeneration of left eye with active choroidal neovascularization (HCC)  H35.3221 OCT, Retina - OU - Both Eyes    Intravitreal Injection, Pharmacologic Agent - OS - Left Eye    Bevacizumab (AVASTIN) SOLN 1.25 mg    3. 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 Pseudophakia of both eyes  Z96.1      1. 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  - S/P IVA OD #1 (12.03.18), #2 (01.02.19), #3 (01.30.19), #4 (03.01.19), #5 (04.01.19), #6 (04.30.19), #7 (06.05.19), #8 (07.19.19), #9 (08.30.19), #10 (10.14.19), #11 (11.18.19), #12 (12.30.19), #13 (02.10.20), #14 (04.06.20), #15 (09.17.20), #16 (10.15.20), #17 (12.01.20), #18 (01.18.21), #19 (03.15.21), #20 (5.11.21), #21 (07.20.21), #22 (10.14.21), #23 (01.10.22), #24 (03.07.22), #24 (05.23.22), #25 (9.1.22), #26 (11.01.22), #27 (12.28.22), # 28 (02.14.23), #29 (03.28.23)   - s/p IVE OD #1 (05.09.23) sample, #2 (06.14.23), #3 (07.12.23), #4 (08.22.23), #5 (10.03.23)., #6 (11.21.23), #7 (01.09.24), #8 (02.27.24), #9 (04.23.24), #10 (06.17.24), #11 (08.12.24), #12 (09.09.24), #13 (10.15.24), #14 (11.25.24)  - FA (04.01.19) shows mild CNVM OD consistent with  exudative ARMD  - OCT shows [id number] improvement in IRF and SRHM temporal macula, mild interval increase in IRF / PED IN macula at 8 weeks  - BCVA OD stable at 20/60   - recommend IVA OD #30 today (due to no funding for [employer]), 01.20.25 w/ f/u back to 6 wks  - pt wishes to proceed with Avastin  - RBA of procedure discussed, questions answered  - see procedure note  - Avastin informed consent form signed and scanned on 01.20.25 (OU)  - f/u 6 weeks -- DFE/OCT/possible injection  2. Age related macular degeneration, exudative, OS  - interval conversion from non-exudative to exudative noted 07.20.21  - s/p IVA OS #1 (07.20.21), #2 (08.19.21), #3 (09.16.21), #4 (10.14.21), #5 (11.29.21), #6 (01.10.22), #7 (03.07.22), #8 (05.23.22), #9 (09.01.22), #10 (03.28.23), #11 (05.09.23), #12 (06.14.23), #13 (07.12.23), #14 (08.22.23), #15 (10.03.23), #16 (11.21.23), #17 (01.09.24), #18 (02.27.24) -- IVA resistance  - s/p IVE OS #1 (08.12.24), #2 (09.09.24), #3 (10.15.24), #4 (11.25.24)  - exam shows no heme  - OCT with OS: stable improvement in IRF / PED nasal macula, persistent VMT w/ cystic changes -- improved, low PED / [facility] inferior macula at 8 wks  - BCVA OS 20/60 from 20/50  - recommend IVA OS #19 (due to no funding for [employer]) today, 01.20.25 w/ f/u back to 6 wks  - pt wishes to proceed with injection  - RBA of procedure discussed, questions answered - IVA informed consent obtained and signed, 01.20.25 (OU) - see procedure note - f/u 6 weeks DFE, OCT  3. 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 Pseudophakia OU  - s/p CE/IOL OU  - s/p YAG OU  - beautiful  surgeries, doing well  - continue to monitor  Ophthalmic Meds Ordered this visit:  Meds ordered this encounter  Medications   Bevacizumab (AVASTIN) SOLN 1.25 mg   Bevacizumab (AVASTIN) SOLN 1.25 mg     Return in about 6 weeks (around 12/14/2023) for f/u exu ARMD OU, DFE, OCT.  There are no Patient Instructions on file for this visit.  This document serves as a record of services personally performed by Karie Chimera, MD, PhD. It was created on their behalf by Glee Arvin. Manson Passey, OA an ophthalmic technician. The creation of this record is the provider's dictation and/or activities during the visit.    Electronically signed by: Glee Arvin. Manson Passey, OA 11/02/23 11:00 AM   Karie Chimera, M.D., Ph.D. Diseases & Surgery of the Retina and Vitreous [facility]  I have reviewed the above documentation for accuracy and completeness, and I agree with the above. Karie Chimera, M.D., Ph.D. 11/02/23 11:02 AM   Abbreviations: M myopia (nearsighted); A astigmatism; H hyperopia (farsighted); P presbyopia; Mrx spectacle prescription;  CTL contact lenses; OD right eye; OS left eye; OU both eyes  XT exotropia; ET esotropia; PEK punctate epithelial keratitis; PEE punctate epithelial erosions; DES dry eye syndrome; MGD meibomian gland dysfunction; ATs artificial tears; PFAT's preservative free artificial tears; NSC nuclear sclerotic cataract; PSC posterior subcapsular cataract; ERM epi-retinal membrane; PVD posterior vitreous detachment; RD retinal detachment; DM diabetes mellitus; DR diabetic retinopathy; NPDR non-proliferative diabetic retinopathy; PDR proliferative diabetic retinopathy; CSME clinically significant macular edema; DME diabetic macular edema; dbh dot blot hemorrhages; CWS cotton wool spot; POAG primary open angle glaucoma; C/D cup-to-disc ratio; HVF humphrey visual field; GVF goldmann visual field; OCT optical coherence tomography; IOP intraocular pressure; BRVO Branch  retinal vein occlusion; CRVO central retinal vein occlusion; CRAO central retinal artery occlusion; BRAO branch retinal artery occlusion; RT retinal tear; SB scleral buckle; PPV pars plana vitrectomy; VH Vitreous hemorrhage; PRP panretinal laser photocoagulation; IVK intravitreal kenalog; VMT vitreomacular traction; MH Macular hole;  NVD neovascularization of the disc; NVE neovascularization elsewhere; AREDS age related eye disease study; ARMD age related macular degeneration; POAG primary open angle glaucoma; EBMD epithelial/anterior basement membrane dystrophy; ACIOL anterior chamber intraocular lens; IOL intraocular lens; PCIOL posterior chamber intraocular lens; Phaco/IOL phacoemulsification with intraocular lens placement; PRK photorefractive keratectomy; LASIK laser assisted in situ keratomileusis; HTN hypertension; DM diabetes mellitus; COPD chronic obstructive pulmonary disease

## 2023-11-02 ENCOUNTER — Encounter (INDEPENDENT_AMBULATORY_CARE_PROVIDER_SITE_OTHER): Payer: Self-pay | Admitting: Ophthalmology

## 2023-11-02 ENCOUNTER — Ambulatory Visit (INDEPENDENT_AMBULATORY_CARE_PROVIDER_SITE_OTHER): Payer: Medicare Other | Admitting: Ophthalmology

## 2023-11-02 DIAGNOSIS — H353231 Exudative age-related macular degeneration, bilateral, with active choroidal neovascularization: Secondary | ICD-10-CM

## 2023-11-02 DIAGNOSIS — Z961 Presence of intraocular lens: Secondary | ICD-10-CM

## 2023-11-02 DIAGNOSIS — H353221 Exudative age-related macular degeneration, left eye, with active choroidal neovascularization: Secondary | ICD-10-CM

## 2023-11-02 DIAGNOSIS — H43823 Vitreomacular adhesion, bilateral: Secondary | ICD-10-CM

## 2023-11-02 DIAGNOSIS — H353211 Exudative age-related macular degeneration, right eye, with active choroidal neovascularization: Secondary | ICD-10-CM

## 2023-11-02 MED ORDER — BEVACIZUMAB CHEMO INJECTION 1.25MG/0.05ML SYRINGE FOR KALEIDOSCOPE
1.2500 mg | INTRAVITREAL | Status: AC | PRN
  Administered 2023-11-02: 1.25 mg via INTRAVITREAL

## 2023-12-07 ENCOUNTER — Other Ambulatory Visit: Payer: Self-pay | Admitting: Cardiology

## 2023-12-07 DIAGNOSIS — I10 Essential (primary) hypertension: Secondary | ICD-10-CM

## 2023-12-14 NOTE — Progress Notes (Signed)
 [facility] - Clinic Note  12/21/2023     CHIEF COMPLAINT Patient presents for Retina Follow Up    HISTORY OF PRESENT ILLNESS: Lindsay Harvey is a 88 y.o. 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  This started 6 weeks ago.  I, the attending physician,  performed the HPI with the patient and updated documentation appropriately.        Comments   Patient here for 6 weeks retina follow up for exu ARMD OU. Patient states vision seems ok. No eye pain.       Last edited by Rennis Chris, MD on 12/21/2023 12:42 PM.    Pt states vision is "okay", she is not having any problems with her vision   Referring physician: Ralene Ok, MD [address] [place],  [place] [address]  HISTORICAL INFORMATION:   Selected notes from the MEDICAL RECORD NUMBER Referred by Dr. Marcha Solders for concern of AMD OU, CNV   CURRENT MEDICATIONS: Current Outpatient Medications (Ophthalmic Drugs)  Medication Sig   carboxymethylcellulose (REFRESH PLUS) 0.5 % SOLN Place 1 drop into both eyes 3 (three) times daily as needed (dry/irritated eyes).    prednisoLONE acetate (PRED FORTE) 1 % ophthalmic suspension Place 1 drop into the right eye 4 (four) times daily.   REFRESH OPTIVE MEGA-3 0.5-1-0.5 % SOLN Place 1 drop into both eyes 3 (three) times daily as needed (discomfort/eye irritation.).   No current facility-administered medications for this visit. (Ophthalmic Drugs)   Current Outpatient Medications (Other)  Medication Sig   amLODipine (NORVASC) 5 MG tablet TAKE 1 TABLET BY MOUTH DAILY   aspirin EC 81 MG tablet Take 1 tablet (81 mg total) by mouth daily. Swallow whole.   Cholecalciferol (VITAMIN D3) 50 MCG (2000 UT) TABS Take 2,000 Units by mouth daily.   simvastatin (ZOCOR) 40 MG tablet Take 20 mg by mouth every evening. 1/4 tablet at night   calcium carbonate (OSCAL) 1500 (600 Ca) MG TABS tablet Take 600  mg of elemental calcium by mouth 2 (two) times a day.   nitroGLYCERIN (NITROSTAT) 0.4 MG SL tablet Place 1 tablet (0.4 mg total) under the tongue every 5 (five) minutes as needed for chest pain.   Current Facility-Administered Medications (Other)  Medication Route   Bevacizumab (AVASTIN) SOLN 1.25 mg Intravitreal   Bevacizumab (AVASTIN) SOLN 1.25 mg Intravitreal   Bevacizumab (AVASTIN) SOLN 1.25 mg Intravitreal   Bevacizumab (AVASTIN) SOLN 1.25 mg Intravitreal   Bevacizumab (AVASTIN) SOLN 1.25 mg Intravitreal   Bevacizumab (AVASTIN) SOLN 1.25 mg Intravitreal   Bevacizumab (AVASTIN) SOLN 1.25 mg Intravitreal   Bevacizumab (AVASTIN) SOLN 1.25 mg Intravitreal   Bevacizumab (AVASTIN) SOLN 1.25 mg Intravitreal   Bevacizumab (AVASTIN) SOLN 1.25 mg Intravitreal   Bevacizumab (AVASTIN) SOLN 1.25 mg Intravitreal   Bevacizumab (AVASTIN) SOLN 1.25 mg Intravitreal   Bevacizumab (AVASTIN) SOLN 1.25 mg Intravitreal   REVIEW OF SYSTEMS: ROS   Positive for: Gastrointestinal, Eyes Negative for: Constitutional, Neurological, Skin, Genitourinary, Musculoskeletal, HENT, Endocrine, Cardiovascular, Respiratory, Psychiatric, Allergic/Imm, Heme/Lymph Last edited by Laddie Aquas, COA on 12/21/2023  8:17 AM.     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   Headache    Hypercholesterolemia  Macular degeneration    Wet OU   Wears glasses    Past Surgical History:  Procedure Laterality Date   25 GAUGE PARS PLANA VITRECTOMY WITH 20 GAUGE MVR PORT FOR MACULAR HOLE Right 04/07/2019   Procedure: 25 GAUGE PARS PLANA VITRECTOMY WITH 20 GAUGE MVR PORT FOR MACULAR HOLE;  Surgeon: Rennis Chris, MD;  Location: [facility] OR;  Service: Ophthalmology;  Laterality: Right;   CATARACT EXTRACTION Bilateral    2013   DILATION AND CURETTAGE OF UTERUS     EYE SURGERY Bilateral 2013   Cat Sx    GAS/FLUID EXCHANGE Right 04/07/2019   Procedure: Gas/Fluid Exchange;  Surgeon: Rennis Chris, MD;  Location: [facility] OR;  Service: Ophthalmology;  Laterality: Right;   MEMBRANE PEEL Right 04/07/2019   Procedure: Eula Flax;  Surgeon: Rennis Chris, MD;  Location: [facility] OR;  Service: Ophthalmology;  Laterality: Right;   PHOTOCOAGULATION WITH LASER Right 04/07/2019   Procedure: Photocoagulation With Laser;  Surgeon: Rennis Chris, MD;  Location: [facility] OR;  Service: Ophthalmology;  Laterality: Right;   YAG LASER APPLICATION Bilateral    FAMILY HISTORY Family History  Problem Relation Age of Onset   Stroke Father    SOCIAL HISTORY Social History   Tobacco Use   Smoking status: Never   Smokeless tobacco: Never  Vaping Use   Vaping status: Never Used  Substance Use Topics   Alcohol use: No   Drug use: No       OPHTHALMIC EXAM: Base Eye Exam     Visual Acuity (Snellen - Linear)       Right Left   Dist [place] 20/80 -2 20/40 -2   Dist ph [place] 20/60 NI         Tonometry (Tonopen, 8:15 AM)       Right Left   Pressure 12 14         Pupils       Dark Light Shape React APD   Right 3 2 Round Brisk None   Left 3 2 Round Brisk None         Visual Fields (Counting fingers)       Left Right    Full Full         Extraocular Movement       Right Left    Full, Ortho Full, Ortho         Neuro/Psych     Oriented x3: Yes   Mood/Affect: Normal         Dilation     Both eyes: 1.0% Mydriacyl, 2.5% Phenylephrine @ 8:15 AM           Slit Lamp and Fundus Exam     Slit Lamp Exam       Right Left   Lids/Lashes Dermatochalasis - upper lid, periorbital edema Dermatochalasis - upper lid   Conjunctiva/Sclera White and quiet White and quiet   Cornea Arcus, 1+ fine Punctate epithelial erosions, mild endo pigment Arcus, 3+ Punctate epithelial erosions, mild tear film debris, mild endo pigment, dry tear fil, decreased TBUT   Anterior Chamber Deep, quiet Deep and quiet    Iris Round and dilated Round and dilated   Lens PC IOL in good postion with anterior capsular phimosis, 1+ Posterior capsular opacification PC IOL in good postion with anterior capsular phimosis, 1+ Posterior capsular opacification sparing center   Anterior Vitreous post vitrectomy Vitreous syneresis         Fundus Exam       Right Left  Disc Sharp rim, mild pallor, peripapillary drusen, PPP Pink and Sharp, central pallor, +cupping, +PPP, peripapillary drusen   C/D Ratio 0.5 0.75   Macula flat; ERM/VMT gone, blunted foveal reflex, refractile drusen, trace cystic changes -- stably improved, pigment clumping, focal IRH centrally - stably resolved, scattered atrophy, no heme Blunted foveal reflex, refractile Drusen, early Atrophy, RPE mottling and clumping, punctate CNV / IRH nasal macula with cystic changes -- stably improved, no heme   Vessels attenuated, mild tortuosity attenuated, Tortuous   Periphery Attached, scattered peripheral drusen, peripheral laser changes superiorly Attached with peripheral drusen           IMAGING AND PROCEDURES  Imaging and Procedures for 02/09/18  OCT, Retina - OU - Both Eyes       Right Eye Quality was good. Central Foveal Thickness: 218. Progression has improved. Findings include no IRF, no SRF, abnormal foveal contour, retinal drusen , subretinal hyper-reflective material, intraretinal hyper-reflective material, pigment epithelial detachment, outer retinal atrophy (stable improvement in IRF and SRHM temporal macula, mild interval improvement in IRF / PED IN macula).   Left Eye Quality was good. Central Foveal Thickness: 214. Progression has been stable. Findings include no SRF, abnormal foveal contour, retinal drusen , subretinal hyper-reflective material, epiretinal membrane, intraretinal fluid, pigment epithelial detachment, vitreous traction, outer retinal atrophy (stable improvement in IRF / PED nasal macula, persistent VMT w/ cystic changes --  improved, low PED / [facility] inferior macula).   Notes Images taken, stored on drive  Diagnosis / Impression:  OD: Exudative ARMD -- stable improvement in IRF and SRHM temporal macula, mild interval improvement in IRF / PED IN macula OS: exudative ARMD -- stable improvement in IRF / PED nasal macula, persistent VMT w/ cystic changes -- improved, low PED / [facility] inferior macula  Clinical management:  See below  Abbreviations: NFP - Normal foveal profile. CME - cystoid macular edema. PED - pigment epithelial detachment. IRF - intraretinal fluid. SRF - subretinal fluid. EZ - ellipsoid zone. ERM - epiretinal membrane. ORA - outer retinal atrophy. ORT - outer retinal tubulation. SRHM - subretinal hyper-reflective material       Intravitreal Injection, Pharmacologic Agent - OD - Right Eye       Time Out 12/21/2023. 8:58 AM. Confirmed correct patient, procedure, site, and patient consented.   Anesthesia Topical anesthesia was used. Anesthetic medications included Lidocaine 2%, Proparacaine 0.5%.   Procedure Preparation included 5% betadine to ocular surface, eyelid speculum. A supplied (32g) needle was used.   Injection: 1.25 mg Bevacizumab 1.25mg /0.66ml   Route: Intravitreal, Site: Right Eye   NDC: P3213405, Lot: [id number], Expiration date: 03/06/2024   Post-op Post injection exam found visual acuity of at least counting fingers. The patient tolerated the procedure well. There were no complications. The patient received written and verbal post procedure care education. Post injection medications were not given.      Intravitreal Injection, Pharmacologic Agent - OS - Left Eye       Time Out 12/21/2023. 8:58 AM. Confirmed correct patient, procedure, site, and patient consented.   Anesthesia Topical anesthesia was used. Anesthetic medications included Lidocaine 2%, Proparacaine 0.5%.   Procedure Preparation included 5% betadine to ocular surface, eyelid speculum. A (32g) needle was  used.   Injection: 1.25 mg Bevacizumab 1.25mg /0.18ml   Route: Intravitreal, Site: Left Eye   NDC: P3213405, Lot: [id number] , Expiration date: 01/18/2024   Post-op Post injection exam found visual acuity of at least counting fingers. The patient tolerated the procedure well. There were  no complications. The patient received written and verbal post procedure care education. Post injection medications were not given.            ASSESSMENT/PLAN:    ICD-10-CM   1. Exudative age-related macular degeneration of right eye with active choroidal neovascularization (HCC)  H35.3211 OCT, Retina - OU - Both Eyes    Intravitreal Injection, Pharmacologic Agent - OD - Right Eye    Bevacizumab (AVASTIN) SOLN 1.25 mg    2. Exudative age-related macular degeneration of left eye with active choroidal neovascularization (HCC)  H35.3221 OCT, Retina - OU - Both Eyes    Intravitreal Injection, Pharmacologic Agent - OS - Left Eye    Bevacizumab (AVASTIN) SOLN 1.25 mg    3. 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 Pseudophakia of both eyes  Z96.1      1. 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  - S/P IVA OD #1 (12.03.18), #2 (01.02.19), #3 (01.30.19), #4 (03.01.19), #5 (04.01.19), #6 (04.30.19), #7 (06.05.19), #8 (07.19.19), #9 (08.30.19), #10 (10.14.19), #11 (11.18.19), #12 (12.30.19), #13 (02.10.20), #14 (04.06.20), #15 (09.17.20), #16 (10.15.20), #17 (12.01.20), #18 (01.18.21), #19 (03.15.21), #20 (5.11.21), #21 (07.20.21), #22 (10.14.21), #23 (01.10.22), #24 (03.07.22), #24 (05.23.22), #25 (9.1.22), #26 (11.01.22), #27 (12.28.22), # 28 (02.14.23), #29 (03.28.23), #30 (01.20.25)  - s/p IVE OD #1 (05.09.23) sample, #2 (06.14.23), #3 (07.12.23), #4 (08.22.23), #5 (10.03.23)., #6 (11.21.23), #7 (01.09.24), #8 (02.27.24), #9 (04.23.24), #10 (06.17.24), #11 (08.12.24), #12 (09.09.24), #13 (10.15.24), #14 (11.25.24)  - FA (04.01.19) shows mild CNVM OD  consistent with exudative ARMD  - OCT shows OD: stable improvement in IRF and SRHM temporal macula, mild interval improvement in IRF / PED IN macula at 7 weeks  - BCVA OD stable at 20/60   - recommend IVA OD #31 today, 03.10.25 w/ f/u at 7 wks again  - pt wishes to proceed with Avastin  - RBA of procedure discussed, questions answered  - see procedure note  - Avastin informed consent form signed and scanned on 01.20.25 (OU)  - f/u 7 weeks -- DFE/OCT/possible injection  2. Age related macular degeneration, exudative, OS  - interval conversion from non-exudative to exudative noted 07.20.21  - s/p IVA OS #1 (07.20.21), #2 (08.19.21), #3 (09.16.21), #4 (10.14.21), #5 (11.29.21), #6 (01.10.22), #7 (03.07.22), #8 (05.23.22), #9 (09.01.22), #10 (03.28.23), #11 (05.09.23), #12 (06.14.23), #13 (07.12.23), #14 (08.22.23), #15 (10.03.23), #16 (11.21.23), #17 (01.09.24), #18 (02.27.24), #19 (01.20.25)  - s/p IVE OS #1 (08.12.24), #2 (09.09.24), #3 (10.15.24), #4 (11.25.24)  - exam shows no heme  - OCT with OS: stable improvement in IRF / PED nasal macula, persistent VMT w/ cystic changes -- improved, low PED / [facility] inferior macula at 7 wks  - BCVA OS 20/40 from 20/50  - recommend IVA OS #20 today, 03.10.25 w/ f/u at 7 wks again  - pt wishes to proceed with injection  - RBA of procedure discussed, questions answered - IVA informed consent obtained and signed, 01.20.25 (OU) - see procedure note - f/u 7 weeks DFE, OCT  3. 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 Pseudophakia OU  - s/p CE/IOL OU  - s/p YAG OU  - beautiful surgeries, doing well  - continue to  monitor  Ophthalmic Meds Ordered this visit:  Meds ordered this encounter  Medications   Bevacizumab (AVASTIN) SOLN 1.25 mg   Bevacizumab (AVASTIN) SOLN 1.25 mg     Return in about 7 weeks (around 02/08/2024) for f/u exu ARMD OU, DFE, OCT, Possible Injxn.  There are no Patient Instructions on file for this visit.  This document serves as a record of services personally performed by Karie Chimera, MD, PhD. It was created on their behalf by Glee Arvin. Manson Passey, OA an ophthalmic technician. The creation of this record is the provider's dictation and/or activities during the visit.    Electronically signed by: Glee Arvin. Manson Passey, OA 12/21/23 12:44 PM  Karie Chimera, M.D., Ph.D. Diseases & Surgery of the Retina and Vitreous [facility]  I have reviewed the above documentation for accuracy and completeness, and I agree with the above. Karie Chimera, M.D., Ph.D. 12/21/23 12:49 PM   Abbreviations: M myopia (nearsighted); A astigmatism; H hyperopia (farsighted); P presbyopia; Mrx spectacle prescription;  CTL contact lenses; OD right eye; OS left eye; OU both eyes  XT exotropia; ET esotropia; PEK punctate epithelial keratitis; PEE punctate epithelial erosions; DES dry eye syndrome; MGD meibomian gland dysfunction; ATs artificial tears; PFAT's preservative free artificial tears; NSC nuclear sclerotic cataract; PSC posterior subcapsular cataract; ERM epi-retinal membrane; PVD posterior vitreous detachment; RD retinal detachment; DM diabetes mellitus; DR diabetic retinopathy; NPDR non-proliferative diabetic retinopathy; PDR proliferative diabetic retinopathy; CSME clinically significant macular edema; DME diabetic macular edema; dbh dot blot hemorrhages; CWS cotton wool spot; POAG primary open angle glaucoma; C/D cup-to-disc ratio; HVF humphrey visual field; GVF goldmann visual field; OCT optical coherence tomography; IOP intraocular pressure; BRVO Branch retinal vein occlusion; CRVO  central retinal vein occlusion; CRAO central retinal artery occlusion; BRAO branch retinal artery occlusion; RT retinal tear; SB scleral buckle; PPV pars plana vitrectomy; VH Vitreous hemorrhage; PRP panretinal laser photocoagulation; IVK intravitreal kenalog; VMT vitreomacular traction; MH Macular hole;  NVD neovascularization of the disc; NVE neovascularization elsewhere; AREDS age related eye disease study; ARMD age related macular degeneration; POAG primary open angle glaucoma; EBMD epithelial/anterior basement membrane dystrophy; ACIOL anterior chamber intraocular lens; IOL intraocular lens; PCIOL posterior chamber intraocular lens; Phaco/IOL phacoemulsification with intraocular lens placement; PRK photorefractive keratectomy; LASIK laser assisted in situ keratomileusis; HTN hypertension; DM diabetes mellitus; COPD chronic obstructive pulmonary disease

## 2023-12-21 ENCOUNTER — Ambulatory Visit (INDEPENDENT_AMBULATORY_CARE_PROVIDER_SITE_OTHER): Payer: Medicare Other | Admitting: Ophthalmology

## 2023-12-21 ENCOUNTER — Encounter (INDEPENDENT_AMBULATORY_CARE_PROVIDER_SITE_OTHER): Payer: Self-pay | Admitting: Ophthalmology

## 2023-12-21 DIAGNOSIS — H353211 Exudative age-related macular degeneration, right eye, with active choroidal neovascularization: Secondary | ICD-10-CM

## 2023-12-21 DIAGNOSIS — H353231 Exudative age-related macular degeneration, bilateral, with active choroidal neovascularization: Secondary | ICD-10-CM | POA: Diagnosis not present

## 2023-12-21 DIAGNOSIS — H43823 Vitreomacular adhesion, bilateral: Secondary | ICD-10-CM

## 2023-12-21 DIAGNOSIS — Z961 Presence of intraocular lens: Secondary | ICD-10-CM

## 2023-12-21 DIAGNOSIS — H353221 Exudative age-related macular degeneration, left eye, with active choroidal neovascularization: Secondary | ICD-10-CM

## 2023-12-21 MED ORDER — BEVACIZUMAB CHEMO INJECTION 1.25MG/0.05ML SYRINGE FOR KALEIDOSCOPE
1.2500 mg | INTRAVITREAL | Status: AC | PRN
  Administered 2023-12-21: 1.25 mg via INTRAVITREAL

## 2023-12-21 MED ORDER — BEVACIZUMAB CHEMO INJECTION 1.25MG/0.05ML SYRINGE FOR KALEIDOSCOPE
1.2500 mg | INTRAVITREAL | Status: AC | PRN
Start: 2023-12-21 — End: 2023-12-21
  Administered 2023-12-21: 1.25 mg via INTRAVITREAL

## 2024-02-01 NOTE — Progress Notes (Signed)
 [facility] - Clinic Note  02/08/2024     CHIEF COMPLAINT Patient presents for Retina Follow Up    HISTORY OF PRESENT ILLNESS: Lindsay Harvey is a 88 y.o. 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  This started 7 weeks ago.  Duration of 7 weeks.  Since onset it is stable.  I, the attending physician,  performed the HPI with the patient and updated documentation appropriately.        Comments   7 week retina follow up ARMD and IVA OU pt is reporting no vision changes noticed she denies any flashes has some floaters       Last edited by Ronelle Coffee, MD on 02/08/2024  3:14 PM.    Pt states vision is stable   Referring physician: Edda Goo, MD [address] [place],  [place] [address]  HISTORICAL INFORMATION:   Selected notes from the MEDICAL RECORD NUMBER Referred by Dr. Danelle Dunning for concern of AMD OU, CNV   CURRENT MEDICATIONS: Current Outpatient Medications (Ophthalmic Drugs)  Medication Sig   carboxymethylcellulose (REFRESH PLUS) 0.5 % SOLN Place 1 drop into both eyes 3 (three) times daily as needed (dry/irritated eyes).    prednisoLONE  acetate (PRED FORTE ) 1 % ophthalmic suspension Place 1 drop into the right eye 4 (four) times daily.   REFRESH OPTIVE MEGA-3 0.5-1-0.5 % SOLN Place 1 drop into both eyes 3 (three) times daily as needed (discomfort/eye irritation.).   No current facility-administered medications for this visit. (Ophthalmic Drugs)   Current Outpatient Medications (Other)  Medication Sig   amLODipine  (NORVASC ) 5 MG tablet TAKE 1 TABLET BY MOUTH DAILY   aspirin  EC 81 MG tablet Take 1 tablet (81 mg total) by mouth daily. Swallow whole.   calcium carbonate (OSCAL) 1500 (600 Ca) MG TABS tablet Take 600 mg of elemental calcium by mouth 2 (two) times a day.   Cholecalciferol (VITAMIN D3) 50 MCG (2000 UT) TABS Take 2,000 Units by mouth daily.   nitroGLYCERIN   (NITROSTAT ) 0.4 MG SL tablet Place 1 tablet (0.4 mg total) under the tongue every 5 (five) minutes as needed for chest pain.   simvastatin (ZOCOR) 40 MG tablet Take 20 mg by mouth every evening. 1/4 tablet at night   Current Facility-Administered Medications (Other)  Medication Route   Bevacizumab  (AVASTIN ) SOLN 1.25 mg Intravitreal   Bevacizumab  (AVASTIN ) SOLN 1.25 mg Intravitreal   Bevacizumab  (AVASTIN ) SOLN 1.25 mg Intravitreal   Bevacizumab  (AVASTIN ) SOLN 1.25 mg Intravitreal   Bevacizumab  (AVASTIN ) SOLN 1.25 mg Intravitreal   Bevacizumab  (AVASTIN ) SOLN 1.25 mg Intravitreal   Bevacizumab  (AVASTIN ) SOLN 1.25 mg Intravitreal   Bevacizumab  (AVASTIN ) SOLN 1.25 mg Intravitreal   Bevacizumab  (AVASTIN ) SOLN 1.25 mg Intravitreal   Bevacizumab  (AVASTIN ) SOLN 1.25 mg Intravitreal   Bevacizumab  (AVASTIN ) SOLN 1.25 mg Intravitreal   Bevacizumab  (AVASTIN ) SOLN 1.25 mg Intravitreal   Bevacizumab  (AVASTIN ) SOLN 1.25 mg Intravitreal   REVIEW OF SYSTEMS: ROS   Positive for: Gastrointestinal, Eyes Negative for: Constitutional, Neurological, Skin, Genitourinary, Musculoskeletal, HENT, Endocrine, Cardiovascular, Respiratory, Psychiatric, Allergic/Imm, Heme/Lymph Last edited by Alise Appl, COT on 02/08/2024  2:21 PM.      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  Headache    Hypercholesterolemia    Macular degeneration    Wet OU   Wears glasses    Past Surgical History:  Procedure Laterality Date   25 GAUGE PARS PLANA VITRECTOMY WITH 20 GAUGE MVR PORT FOR MACULAR HOLE Right 04/07/2019   Procedure: 25 GAUGE PARS PLANA VITRECTOMY WITH 20 GAUGE MVR PORT FOR MACULAR HOLE;  Surgeon: Ronelle Coffee, MD;  Location: [facility] OR;  Service: Ophthalmology;  Laterality: Right;   CATARACT EXTRACTION Bilateral    2013   DILATION AND CURETTAGE OF UTERUS     EYE SURGERY  Bilateral 2013   Cat Sx   GAS/FLUID EXCHANGE Right 04/07/2019   Procedure: Gas/Fluid Exchange;  Surgeon: Ronelle Coffee, MD;  Location: [facility] OR;  Service: Ophthalmology;  Laterality: Right;   MEMBRANE PEEL Right 04/07/2019   Procedure: Ludwig Safer;  Surgeon: Ronelle Coffee, MD;  Location: [facility] OR;  Service: Ophthalmology;  Laterality: Right;   PHOTOCOAGULATION WITH LASER Right 04/07/2019   Procedure: Photocoagulation With Laser;  Surgeon: Ronelle Coffee, MD;  Location: [facility] OR;  Service: Ophthalmology;  Laterality: Right;   YAG LASER APPLICATION Bilateral    FAMILY HISTORY Family History  Problem Relation Age of Onset   Stroke Father    SOCIAL HISTORY Social History   Tobacco Use   Smoking status: Never   Smokeless tobacco: Never  Vaping Use   Vaping status: Never Used  Substance Use Topics   Alcohol use: No   Drug use: No       OPHTHALMIC EXAM: Base Eye Exam     Visual Acuity (Snellen - Linear)       Right Left   Dist [place] 20/70 -2 20/60   Dist ph [place] NI 20/50 -3         Tonometry (Tonopen, 2:25 PM)       Right Left   Pressure 12 13         Pupils       Pupils Dark Light Shape React APD   Right PERRL 3 2 Round Brisk None   Left PERRL 3 2 Round Brisk None         Visual Fields       Left Right    Full          Extraocular Movement       Right Left    Full, Ortho Full, Ortho         Neuro/Psych     Oriented x3: Yes   Mood/Affect: Normal         Dilation     Both eyes: 2.5% Phenylephrine  @ 2:26 PM           Slit Lamp and Fundus Exam     Slit Lamp Exam       Right Left   Lids/Lashes Dermatochalasis - upper lid, periorbital edema Dermatochalasis - upper lid   Conjunctiva/Sclera White and quiet White and quiet   Cornea Arcus, 1+ fine Punctate epithelial erosions, mild endo pigment Arcus, 3+ Punctate epithelial erosions, mild tear film debris, mild endo pigment, dry tear fil, decreased TBUT   Anterior Chamber Deep, quiet Deep and quiet    Iris Round and dilated Round and dilated   Lens PC IOL in good postion with anterior capsular phimosis, 1+ Posterior capsular opacification PC IOL in good postion with anterior capsular phimosis, 1+ Posterior capsular opacification sparing center   Anterior Vitreous post vitrectomy Vitreous syneresis         Fundus Exam  Right Left   Disc Sharp rim, mild pallor, peripapillary drusen, PPP Pink and Sharp, central pallor, +cupping, +PPP, peripapillary drusen   C/D Ratio 0.5 0.75   Macula flat; ERM/VMT gone, blunted foveal reflex, refractile drusen, trace cystic changes -- stably improved, pigment clumping, focal IRH centrally - stably resolved, scattered atrophy, no heme Blunted foveal reflex, refractile Drusen, early Atrophy, RPE mottling and clumping, punctate CNV / IRH nasal macula with cystic changes -- stably improved, no heme   Vessels attenuated, mild tortuosity attenuated, Tortuous   Periphery Attached, scattered peripheral drusen, peripheral laser changes superiorly Attached with peripheral drusen           IMAGING AND PROCEDURES  Imaging and Procedures for 02/09/18  OCT, Retina - OU - Both Eyes       Right Eye Quality was good. Central Foveal Thickness: 224. Progression has improved. Findings include no IRF, no SRF, abnormal foveal contour, retinal drusen , subretinal hyper-reflective material, intraretinal hyper-reflective material, pigment epithelial detachment, outer retinal atrophy (stable improvement in IRF and SRHM temporal macula, mild interval improvement in IRF / PED IN macula).   Left Eye Quality was good. Central Foveal Thickness: 218. Progression has been stable. Findings include no SRF, abnormal foveal contour, retinal drusen , subretinal hyper-reflective material, epiretinal membrane, intraretinal fluid, pigment epithelial detachment, vitreous traction, outer retinal atrophy (stable improvement in IRF / PED nasal macula, persistent VMT w/ cystic changes --  improved, low PED / [facility] inferior macula).   Notes Images taken, stored on drive  Diagnosis / Impression:  OD: Exudative ARMD -- stable improvement in IRF and SRHM temporal macula, mild interval improvement in IRF / PED IN macula OS: exudative ARMD -- stable improvement in IRF / PED nasal macula, persistent VMT w/ cystic changes -- improved, low PED / [facility] inferior macula  Clinical management:  See below  Abbreviations: NFP - Normal foveal profile. CME - cystoid macular edema. PED - pigment epithelial detachment. IRF - intraretinal fluid. SRF - subretinal fluid. EZ - ellipsoid zone. ERM - epiretinal membrane. ORA - outer retinal atrophy. ORT - outer retinal tubulation. SRHM - subretinal hyper-reflective material       Intravitreal Injection, Pharmacologic Agent - OD - Right Eye       Time Out 02/08/2024. 2:56 PM. Confirmed correct patient, procedure, site, and patient consented.   Anesthesia Topical anesthesia was used. Anesthetic medications included Lidocaine  2%, Proparacaine  0.5%.   Procedure Preparation included 5% betadine to ocular surface, eyelid speculum. A supplied (32g) needle was used.   Injection: 1.25 mg Bevacizumab  1.25mg /0.74ml   Route: Intravitreal, Site: Right Eye   NDC: H525437, Lot: [id number], Expiration date: 03/10/2024   Post-op Post injection exam found visual acuity of at least counting fingers. The patient tolerated the procedure well. There were no complications. The patient received written and verbal post procedure care education. Post injection medications were not given.      Intravitreal Injection, Pharmacologic Agent - OS - Left Eye       Time Out 02/08/2024. 2:57 PM. Confirmed correct patient, procedure, site, and patient consented.   Anesthesia Topical anesthesia was used. Anesthetic medications included Lidocaine  2%, Proparacaine  0.5%.   Procedure Preparation included 5% betadine to ocular surface, eyelid speculum. A (32g) needle was  used.   Injection: 1.25 mg Bevacizumab  1.25mg /0.75ml   Route: Intravitreal, Site: Left Eye   NDC: H525437, Lot: [id number], Expiration date: 05/17/2024   Post-op Post injection exam found visual acuity of at least counting fingers. The patient tolerated the  procedure well. There were no complications. The patient received written and verbal post procedure care education. Post injection medications were not given.            ASSESSMENT/PLAN:    ICD-10-CM   1. Exudative age-related macular degeneration of right eye with active choroidal neovascularization (HCC)  H35.3211 OCT, Retina - OU - Both Eyes    Intravitreal Injection, Pharmacologic Agent - OD - Right Eye    Intravitreal Injection, Pharmacologic Agent - OS - Left Eye    Bevacizumab  (AVASTIN ) SOLN 1.25 mg    Bevacizumab  (AVASTIN ) SOLN 1.25 mg    2. Exudative age-related macular degeneration of left eye with active choroidal neovascularization (HCC)  H35.3221 Intravitreal Injection, Pharmacologic Agent - OD - Right Eye    Bevacizumab  (AVASTIN ) SOLN 1.25 mg    3. 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 Pseudophakia of both eyes  Z96.1      1. 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  - S/P IVA OD #1 (12.03.18), #2 (01.02.19), #3 (01.30.19), #4 (03.01.19), #5 (04.01.19), #6 (04.30.19), #7 (06.05.19), #8 (07.19.19), #9 (08.30.19), #10 (10.14.19), #11 (11.18.19), #12 (12.30.19), #13 (02.10.20), #14 (04.06.20), #15 (09.17.20), #16 (10.15.20), #17 (12.01.20), #18 (01.18.21), #19 (03.15.21), #20 (5.11.21), #21 (07.20.21), #22 (10.14.21), #23 (01.10.22), #24 (03.07.22), #24 (05.23.22), #25 (9.1.22), #26 (11.01.22), #27 (12.28.22), # 28 (02.14.23), #29 (03.28.23), #30 (01.20.25), #31 (03.10.25)  - s/p IVE OD #1 (05.09.23) sample, #2 (06.14.23), #3 (07.12.23), #4 (08.22.23), #5 (10.03.23)., #6 (11.21.23), #7 (01.09.24), #8 (02.27.24), #9 (04.23.24), #10 (06.17.24), #11 (08.12.24),  #12 (09.09.24), #13 (10.15.24), #14 (11.25.24)  - FA (04.01.19) shows mild CNVM OD consistent with exudative ARMD  - OCT shows OD: stable improvement in IRF and SRHM temporal macula, mild interval improvement in IRF / PED IN macula at 7 weeks  - BCVA OD 20/70 from 20/60   - recommend IVA OD #32 today, 04.28.25 w/ f/u at 7 wks again  - pt wishes to proceed with Avastin   - RBA of procedure discussed, questions answered  - see procedure note  - Avastin  informed consent form signed and scanned on 01.20.25 (OU)  - f/u 7 weeks -- DFE/OCT/possible injection  2. Age related macular degeneration, exudative, OS  - interval conversion from non-exudative to exudative noted 07.20.21  - s/p IVA OS #1 (07.20.21), #2 (08.19.21), #3 (09.16.21), #4 (10.14.21), #5 (11.29.21), #6 (01.10.22), #7 (03.07.22), #8 (05.23.22), #9 (09.01.22), #10 (03.28.23), #11 (05.09.23), #12 (06.14.23), #13 (07.12.23), #14 (08.22.23), #15 (10.03.23), #16 (11.21.23), #17 (01.09.24), #18 (02.27.24), #19 (01.20.25), #20 (03.10.25)  - s/p IVE OS #1 (08.12.24), #2 (09.09.24), #3 (10.15.24), #4 (11.25.24)  - exam shows no heme  - OCT with OS: stable improvement in IRF / PED nasal macula, persistent VMT w/ cystic changes -- improved, low PED / [facility] inferior macula at 7 wks  - BCVA OS 20/50 from 20/40  - recommend IVA OS #21 today, 04.28.25 w/ f/u at 7 wks again  - pt wishes to proceed with injection  - RBA of procedure discussed, questions answered - IVA informed consent obtained and signed, 01.20.25 (OU) - see procedure note - f/u 7 weeks DFE, OCT  3. 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  Pseudophakia OU  - s/p CE/IOL OU  - s/p YAG OU  - beautiful surgeries, doing well  - continue to monitor  Ophthalmic Meds Ordered this visit:  Meds ordered this encounter  Medications   Bevacizumab  (AVASTIN ) SOLN 1.25 mg   Bevacizumab  (AVASTIN ) SOLN 1.25 mg     Return in about 7 weeks (around 03/28/2024) for f/u exu ARMD OU, DFE, OCT, Possible Injxn.  There are no Patient Instructions on file for this visit.  This document serves as a record of services personally performed by Jeanice Millard, MD, PhD. It was created on their behalf by Morley Arabia. Bevin Bucks, OA an ophthalmic technician. The creation of this record is the provider's dictation and/or activities during the visit.    Electronically signed by: Morley Arabia. Bevin Bucks, OA 02/08/24 3:15 PM  Jeanice Millard, M.D., Ph.D. Diseases & Surgery of the Retina and Vitreous [facility]  I have reviewed the above documentation for accuracy and completeness, and I agree with the above. Jeanice Millard, M.D., Ph.D. 02/08/24 3:25 PM   Abbreviations: M myopia (nearsighted); A astigmatism; H hyperopia (farsighted); P presbyopia; Mrx spectacle prescription;  CTL contact lenses; OD right eye; OS left eye; OU both eyes  XT exotropia; ET esotropia; PEK punctate epithelial keratitis; PEE punctate epithelial erosions; DES dry eye syndrome; MGD meibomian gland dysfunction; ATs artificial tears; PFAT's preservative free artificial tears; NSC nuclear sclerotic cataract; PSC posterior subcapsular cataract; ERM epi-retinal membrane; PVD posterior vitreous detachment; RD retinal detachment; DM diabetes mellitus; DR diabetic retinopathy; NPDR non-proliferative diabetic retinopathy; PDR proliferative diabetic retinopathy; CSME clinically significant macular edema; DME diabetic macular edema; dbh dot blot hemorrhages; CWS cotton wool spot; POAG primary open angle glaucoma; C/D cup-to-disc ratio; HVF humphrey visual field; GVF goldmann visual field;  OCT optical coherence tomography; IOP intraocular pressure; BRVO Branch retinal vein occlusion; CRVO central retinal vein occlusion; CRAO central retinal artery occlusion; BRAO branch retinal artery occlusion; RT retinal tear; SB scleral buckle; PPV pars plana vitrectomy; VH Vitreous hemorrhage; PRP panretinal laser photocoagulation; IVK intravitreal kenalog ; VMT vitreomacular traction; MH Macular hole;  NVD neovascularization of the disc; NVE neovascularization elsewhere; AREDS age related eye disease study; ARMD age related macular degeneration; POAG primary open angle glaucoma; EBMD epithelial/anterior basement membrane dystrophy; ACIOL anterior chamber intraocular lens; IOL intraocular lens; PCIOL posterior chamber intraocular lens; Phaco/IOL phacoemulsification with intraocular lens placement; PRK photorefractive keratectomy; LASIK laser assisted in situ keratomileusis; HTN hypertension; DM diabetes mellitus; COPD chronic obstructive pulmonary disease

## 2024-02-08 ENCOUNTER — Ambulatory Visit (INDEPENDENT_AMBULATORY_CARE_PROVIDER_SITE_OTHER): Admitting: Ophthalmology

## 2024-02-08 ENCOUNTER — Encounter (INDEPENDENT_AMBULATORY_CARE_PROVIDER_SITE_OTHER): Payer: Self-pay | Admitting: Ophthalmology

## 2024-02-08 DIAGNOSIS — H353231 Exudative age-related macular degeneration, bilateral, with active choroidal neovascularization: Secondary | ICD-10-CM

## 2024-02-08 DIAGNOSIS — H43823 Vitreomacular adhesion, bilateral: Secondary | ICD-10-CM | POA: Diagnosis not present

## 2024-02-08 DIAGNOSIS — H353221 Exudative age-related macular degeneration, left eye, with active choroidal neovascularization: Secondary | ICD-10-CM

## 2024-02-08 DIAGNOSIS — Z961 Presence of intraocular lens: Secondary | ICD-10-CM | POA: Diagnosis not present

## 2024-02-08 DIAGNOSIS — H353211 Exudative age-related macular degeneration, right eye, with active choroidal neovascularization: Secondary | ICD-10-CM

## 2024-02-08 MED ORDER — BEVACIZUMAB CHEMO INJECTION 1.25MG/0.05ML SYRINGE FOR KALEIDOSCOPE
1.2500 mg | INTRAVITREAL | Status: AC | PRN
  Administered 2024-02-08: 1.25 mg via INTRAVITREAL

## 2024-03-22 NOTE — Progress Notes (Addendum)
 [facility] - Clinic Note  03/28/2024     CHIEF COMPLAINT Patient presents for Retina Follow Up    HISTORY OF PRESENT ILLNESS: [place] Harvey is a 88 y.o. 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  This started 6.5 years ago.  Duration of 7 weeks.  Since onset it is stable.  I, the attending physician,  performed the HPI with the patient and updated documentation appropriately.        Comments   Pt states her vision seems more blurry. Pt states when she gets up in the middle of night everything appears red when the lights are off. Pt states this happened many years ago when she started coming here and now she is noticing it again. Pt states her eyes feel very dry and she is using several drops all day long but still feels like the eyes are dry. Pt is using Systane Ultra TID OU, Genteal gel drops, Refresh Plus PF PRN OU.      Last edited by [name], MD on 03/28/2024  5:15 PM.    Pt states vision is stable, she went to [place]  recently and states her eyes are dryer and more uncomfortable now   Referring physician: [name], MD [id number] [address] [facility] [address] [place],  [place] [address]  HISTORICAL INFORMATION:   Selected notes from the MEDICAL RECORD NUMBER Referred by Dr. KYM Ferrier for concern of AMD OU, CNV   CURRENT MEDICATIONS: Current Outpatient Medications (Ophthalmic Drugs)  Medication Sig   carboxymethylcellulose (REFRESH PLUS) 0.5 % SOLN Place 1 drop into both eyes 3 (three) times daily as needed (dry/irritated eyes).    prednisoLONE  acetate (PRED FORTE ) 1 % ophthalmic suspension Place 1 drop into the right eye 4 (four) times daily.   REFRESH OPTIVE MEGA-3 0.5-1-0.5 % SOLN Place 1 drop into both eyes 3 (three) times daily as needed (discomfort/eye irritation.).   No current facility-administered medications for this visit. (Ophthalmic Drugs)   Current Outpatient  Medications (Other)  Medication Sig   amLODipine  (NORVASC ) 5 MG tablet TAKE 1 TABLET BY MOUTH DAILY   aspirin  EC 81 MG tablet Take 1 tablet (81 mg total) by mouth daily. Swallow whole.   calcium carbonate (OSCAL) 1500 (600 Ca) MG TABS tablet Take 600 mg of elemental calcium by mouth 2 (two) times a day.   Cholecalciferol (VITAMIN D3) 50 MCG (2000 UT) TABS Take 2,000 Units by mouth daily.   simvastatin (ZOCOR) 40 MG tablet Take 20 mg by mouth every evening. 1/4 tablet at night   nitroGLYCERIN  (NITROSTAT ) 0.4 MG SL tablet Place 1 tablet (0.4 mg total) under the tongue every 5 (five) minutes as needed for chest pain. (Patient not taking: Reported on 03/28/2024)   Current Facility-Administered Medications (Other)  Medication Route   Bevacizumab  (AVASTIN ) SOLN 1.25 mg Intravitreal   Bevacizumab  (AVASTIN ) SOLN 1.25 mg Intravitreal   Bevacizumab  (AVASTIN ) SOLN 1.25 mg Intravitreal   Bevacizumab  (AVASTIN ) SOLN 1.25 mg Intravitreal   Bevacizumab  (AVASTIN ) SOLN 1.25 mg Intravitreal   Bevacizumab  (AVASTIN ) SOLN 1.25 mg Intravitreal   Bevacizumab  (AVASTIN ) SOLN 1.25 mg Intravitreal   Bevacizumab  (AVASTIN ) SOLN 1.25 mg Intravitreal   Bevacizumab  (AVASTIN ) SOLN 1.25 mg Intravitreal   Bevacizumab  (AVASTIN ) SOLN 1.25 mg Intravitreal   Bevacizumab  (AVASTIN ) SOLN 1.25 mg Intravitreal   Bevacizumab  (AVASTIN ) SOLN 1.25 mg Intravitreal   Bevacizumab  (AVASTIN ) SOLN  1.25 mg Intravitreal   REVIEW OF SYSTEMS: ROS   Positive for: Gastrointestinal, Eyes Negative for: Constitutional, Neurological, Skin, Genitourinary, Musculoskeletal, HENT, Endocrine, Cardiovascular, Respiratory, Psychiatric, Allergic/Imm, Heme/Lymph Last edited by Elnor Avelina RAMAN, COT on 03/28/2024  2:23 PM.       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   Headache    Hypercholesterolemia    Macular  degeneration    Wet OU   Wears glasses    Past Surgical History:  Procedure Laterality Date   25 GAUGE PARS PLANA VITRECTOMY WITH 20 GAUGE MVR PORT FOR MACULAR HOLE Right 04/07/2019   Procedure: 25 GAUGE PARS PLANA VITRECTOMY WITH 20 GAUGE MVR PORT FOR MACULAR HOLE;  Surgeon: [name], MD;  Location: [facility] OR;  Service: Ophthalmology;  Laterality: Right;   CATARACT EXTRACTION Bilateral    2013   DILATION AND CURETTAGE OF UTERUS     EYE SURGERY Bilateral 2013   Cat Sx   GAS/FLUID EXCHANGE Right 04/07/2019   Procedure: Gas/Fluid Exchange;  Surgeon: [name], MD;  Location: [facility] OR;  Service: Ophthalmology;  Laterality: Right;   MEMBRANE PEEL Right 04/07/2019   Procedure: Ottie Booty;  Surgeon: [name], MD;  Location: [facility] OR;  Service: Ophthalmology;  Laterality: Right;   PHOTOCOAGULATION WITH LASER Right 04/07/2019   Procedure: Photocoagulation With Laser;  Surgeon: [name], MD;  Location: [facility] OR;  Service: Ophthalmology;  Laterality: Right;   YAG LASER APPLICATION Bilateral    FAMILY HISTORY Family History  Problem Relation Age of Onset   Stroke Father    SOCIAL HISTORY Social History   Tobacco Use   Smoking status: Never   Smokeless tobacco: Never  Vaping Use   Vaping status: Never Used  Substance Use Topics   Alcohol use: No   Drug use: No       OPHTHALMIC EXAM: Base Eye Exam     Visual Acuity (Snellen - Linear)       Right Left   Dist [place] 20/70 20/60 -2   Dist ph [place] NI NI         Tonometry (Tonopen, 2:29 PM)       Right Left   Pressure 11 15         Pupils       Pupils Dark Light Shape React APD   Right PERRL 3 2 Round Brisk None   Left PERRL 3 2 Round Brisk None         Visual Fields       Left Right    Full Full         Extraocular Movement       Right Left    Full, Ortho Full, Ortho         Neuro/Psych     Oriented x3: Yes   Mood/Affect: Normal         Dilation     Both eyes: 1.0% Mydriacyl , 2.5%  Phenylephrine  @ 2:29 PM           Slit Lamp and Fundus Exam     Slit Lamp Exam       Right Left   Lids/Lashes Dermatochalasis - upper lid, periorbital edema Dermatochalasis - upper lid   Conjunctiva/Sclera White and quiet White and quiet   Cornea Arcus, 1+ fine Punctate epithelial erosions, mild endo pigment Arcus, 1-2+ inferior Punctate  epithelial erosions, mild tear film debris, mild endo pigment, decreased TBUT   Anterior Chamber Deep, quiet Deep and quiet   Iris Round and dilated Round and dilated   Lens PC IOL in good postion with anterior capsular phimosis, 1+ Posterior capsular opacification PC IOL in good postion with anterior capsular phimosis, 1+ Posterior capsular opacification sparing center   Anterior Vitreous post vitrectomy Vitreous syneresis         Fundus Exam       Right Left   Disc Sharp rim, mild pallor, peripapillary drusen, PPP, +cupping Pink and Sharp, central pallor, +cupping, +PPP, peripapillary drusen   C/D Ratio 0.65 0.75   Macula flat; ERM/VMT gone, blunted foveal reflex, refractile drusen, trace cystic changes -- stably improved, pigment clumping, focal IRH centrally - stably resolved, scattered atrophy, no heme Blunted foveal reflex, refractile Drusen, early Atrophy, RPE mottling and clumping, punctate CNV / IRH nasal macula with cystic changes -- stably improved, no heme   Vessels attenuated, Tortuous attenuated, Tortuous   Periphery Attached, scattered peripheral drusen, peripheral laser changes superiorly Attached with peripheral drusen           IMAGING AND PROCEDURES  Imaging and Procedures for 02/09/18  OCT, Retina - OU - Both Eyes       Right Eye Quality was good. Central Foveal Thickness: 218. Progression has been stable. Findings include no IRF, no SRF, abnormal foveal contour, retinal drusen , subretinal hyper-reflective material, intraretinal hyper-reflective material, pigment epithelial detachment, outer retinal atrophy (stable  improvement in IRF and SRHM temporal macula, stable improvement in IRF / PED IN macula).   Left Eye Quality was good. Central Foveal Thickness: 205. Progression has improved. Findings include no SRF, abnormal foveal contour, retinal drusen , subretinal hyper-reflective material, epiretinal membrane, intraretinal fluid, pigment epithelial detachment, vitreous traction, outer retinal atrophy (stable improvement in IRF / PED nasal macula, persistent VMT w/ cystic changes -- improved, low PED / [facility] inferior macula).   Notes Images taken, stored on drive  Diagnosis / Impression:  OD: Exudative ARMD -- stable improvement in IRF and SRHM temporal macula, stable improvement in IRF / PED IN macula OS: exudative ARMD -- stable improvement in IRF / PED nasal macula, persistent VMT w/ cystic changes -- improved, low PED / [facility] inferior macula  Clinical management:  See below  Abbreviations: NFP - Normal foveal profile. CME - cystoid macular edema. PED - pigment epithelial detachment. IRF - intraretinal fluid. SRF - subretinal fluid. EZ - ellipsoid zone. ERM - epiretinal membrane. ORA - outer retinal atrophy. ORT - outer retinal tubulation. SRHM - subretinal hyper-reflective material       Intravitreal Injection, Pharmacologic Agent - OD - Right Eye       Time Out 03/28/2024. 3:08 PM. Confirmed correct patient, procedure, site, and patient consented.   Anesthesia Topical anesthesia was used. Anesthetic medications included Lidocaine  2%, Proparacaine  0.5%.   Procedure Preparation included 5% betadine to ocular surface, eyelid speculum. A supplied (32g) needle was used.   Injection: 1.25 mg Bevacizumab  1.25mg /0.9ml   Route: Intravitreal, Site: Right Eye   NDC: H525437, Lot: [id number], Expiration date: 04/22/2024   Post-op Post injection exam found visual acuity of at least counting fingers. The patient tolerated the procedure well. There were no complications. The patient received written and  verbal post procedure care education. Post injection medications were not given.      Intravitreal Injection, Pharmacologic Agent - OS - Left Eye       Time Out 03/28/2024.  3:08 PM. Confirmed correct patient, procedure, site, and patient consented.   Anesthesia Topical anesthesia was used. Anesthetic medications included Lidocaine  2%, Proparacaine  0.5%.   Procedure Preparation included 5% betadine to ocular surface, eyelid speculum. A (32g) needle was used.   Injection: 1.25 mg Bevacizumab  1.25mg /0.22ml   Route: Intravitreal, Site: Left Eye   NDC: H525437, Lot: [id number], Expiration date: 06/16/2024   Post-op Post injection exam found visual acuity of at least counting fingers. The patient tolerated the procedure well. There were no complications. The patient received written and verbal post procedure care education. Post injection medications were not given.             ASSESSMENT/PLAN:    ICD-10-CM   1. Exudative age-related macular degeneration of right eye with active choroidal neovascularization (HCC)  H35.3211 OCT, Retina - OU - Both Eyes    Intravitreal Injection, Pharmacologic Agent - OD - Right Eye    Bevacizumab  (AVASTIN ) SOLN 1.25 mg    2. Exudative age-related macular degeneration of left eye with active choroidal neovascularization (HCC)  H35.3221 OCT, Retina - OU - Both Eyes    Intravitreal Injection, Pharmacologic Agent - OS - Left Eye    Bevacizumab  (AVASTIN ) SOLN 1.25 mg    3. 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 Pseudophakia of both eyes  Z96.1       1. 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  - S/P IVA OD #1 (12.03.18), #2 (01.02.19), #3 (01.30.19), #4 (03.01.19), #5 (04.01.19), #6 (04.30.19), #7 (06.05.19), #8 (07.19.19), #9 (08.30.19), #10 (10.14.19), #11 (11.18.19), #12 (12.30.19), #13 (02.10.20), #14 (04.06.20), #15 (09.17.20), #16 (10.15.20), #17 (12.01.20), #18 (01.18.21), #19  (03.15.21), #20 (5.11.21), #21 (07.20.21), #22 (10.14.21), #23 (01.10.22), #24 (03.07.22), #24 (05.23.22), #25 (9.1.22), #26 (11.01.22), #27 (12.28.22), # 28 (02.14.23), #29 (03.28.23), #30 (01.20.25), #31 (03.10.25), #32 (04.28.25)  - s/p IVE OD #1 (05.09.23) sample, #2 (06.14.23), #3 (07.12.23), #4 (08.22.23), #5 (10.03.23)., #6 (11.21.23), #7 (01.09.24), #8 (02.27.24), #9 (04.23.24), #10 (06.17.24), #11 (08.12.24), #12 (09.09.24), #13 (10.15.24), #14 (11.25.24)  - FA (04.01.19) shows mild CNVM OD consistent with exudative ARMD  - OCT shows OD: stable improvement in IRF and SRHM temporal macula, mild interval improvement in IRF / PED IN macula at 7 weeks  - BCVA OD stable at 20/70  - recommend IVA OD #33 today, 06.16.25 w/ f/u in 6-7 wks again  - pt wishes to proceed with Avastin   - RBA of procedure discussed, questions answered  - see procedure note  - Avastin  informed consent form signed and scanned on 01.20.25 (OU)  - f/u 6-7 weeks -- DFE/OCT/possible injection  2. Age related macular degeneration, exudative, OS  - interval conversion from non-exudative to exudative noted 07.20.21  - s/p IVA OS #1 (07.20.21), #2 (08.19.21), #3 (09.16.21), #4 (10.14.21), #5 (11.29.21), #6 (01.10.22), #7 (03.07.22), #8 (05.23.22), #9 (09.01.22), #10 (03.28.23), #11 (05.09.23), #12 (06.14.23), #13 (07.12.23), #14 (08.22.23), #15 (10.03.23), #16 (11.21.23), #17 (01.09.24), #18 (02.27.24), #19 (01.20.25), #20 (03.10.25), #21 (04.28.25)  - s/p IVE OS #1 (08.12.24), #2 (09.09.24), #3 (10.15.24), #4 (11.25.24)  - exam shows no heme  - OCT with OS: stable improvement in IRF / PED nasal macula, persistent VMT w/ cystic changes -- improved, low PED / [facility] inferior macula at 7 wks  - BCVA OS 20/60 from 20/50  - recommend IVA OS #22 today, 06.16.25 w/ f/u in 6-7 wks again  - pt wishes to proceed with injection  - RBA  of procedure discussed, questions answered - IVA informed consent obtained and signed, 01.20.25 (OU) -  see procedure note - f/u 6-7 weeks DFE, OCT  3. 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 Pseudophakia OU  - s/p CE/IOL OU  - s/p YAG OU  - beautiful surgeries, doing well  - monitor  Ophthalmic Meds Ordered this visit:  Meds ordered this encounter  Medications   Bevacizumab  (AVASTIN ) SOLN 1.25 mg   Bevacizumab  (AVASTIN ) SOLN 1.25 mg     Return for f/u 6-7 weeks, exu ARMD OU, DFE, OCT, Possible Injxn.  There are no Patient Instructions on file for this visit.  This document serves as a record of services personally performed by Redell JUDITHANN Hans, MD, PhD. It was created on their behalf by Avelina Pereyra, COA an ophthalmic technician. The creation of this record is the provider's dictation and/or activities during the visit.   Electronically signed by: Avelina GORMAN Pereyra, COT  04/02/24  12:29 AM   This document serves as a record of services personally performed by Redell JUDITHANN Hans, MD, PhD. It was created on their behalf by Alan PARAS. Delores, OA an ophthalmic technician. The creation of this record is the provider's dictation and/or activities during the visit.    Electronically signed by: Alan PARAS. Delores, OA 04/02/24 12:29 AM  Redell JUDITHANN Hans, M.D., Ph.D. Diseases & Surgery of the Retina and Vitreous [facility]  I have reviewed the above documentation for accuracy and completeness, and I agree with the above. Redell JUDITHANN Hans, M.D., Ph.D. 04/02/24 12:29 AM   Abbreviations: M myopia (nearsighted); A astigmatism; H hyperopia (farsighted); P presbyopia; Mrx spectacle prescription;  CTL contact lenses; OD right eye; OS left  eye; OU both eyes  XT exotropia; ET esotropia; PEK punctate epithelial keratitis; PEE punctate epithelial erosions; DES dry eye syndrome; MGD meibomian gland dysfunction; ATs artificial tears; PFAT's preservative free artificial tears; NSC nuclear sclerotic cataract; PSC posterior subcapsular cataract; ERM epi-retinal membrane; PVD posterior vitreous detachment; RD retinal detachment; DM diabetes mellitus; DR diabetic retinopathy; NPDR non-proliferative diabetic retinopathy; PDR proliferative diabetic retinopathy; CSME clinically significant macular edema; DME diabetic macular edema; dbh dot blot hemorrhages; CWS cotton wool spot; POAG primary open angle glaucoma; C/D cup-to-disc ratio; HVF humphrey visual field; GVF goldmann visual field; OCT optical coherence tomography; IOP intraocular pressure; BRVO Branch retinal vein occlusion; CRVO central retinal vein occlusion; CRAO central retinal artery occlusion; BRAO branch retinal artery occlusion; RT retinal tear; SB scleral buckle; PPV pars plana vitrectomy; VH Vitreous hemorrhage; PRP panretinal laser photocoagulation; IVK intravitreal kenalog ; VMT vitreomacular traction; MH Macular hole;  NVD neovascularization of the disc; NVE neovascularization elsewhere; AREDS age related eye disease study; ARMD age related macular degeneration; POAG primary open angle glaucoma; EBMD epithelial/anterior basement membrane dystrophy; ACIOL anterior chamber intraocular lens; IOL intraocular lens; PCIOL posterior chamber intraocular lens; Phaco/IOL phacoemulsification with intraocular lens placement; PRK photorefractive  keratectomy; LASIK laser assisted in situ keratomileusis; HTN hypertension; DM diabetes mellitus; COPD chronic obstructive pulmonary disease

## 2024-03-28 ENCOUNTER — Encounter (INDEPENDENT_AMBULATORY_CARE_PROVIDER_SITE_OTHER): Payer: Self-pay | Admitting: Ophthalmology

## 2024-03-28 ENCOUNTER — Ambulatory Visit (INDEPENDENT_AMBULATORY_CARE_PROVIDER_SITE_OTHER): Admitting: Ophthalmology

## 2024-03-28 DIAGNOSIS — Z961 Presence of intraocular lens: Secondary | ICD-10-CM

## 2024-03-28 DIAGNOSIS — H353231 Exudative age-related macular degeneration, bilateral, with active choroidal neovascularization: Secondary | ICD-10-CM

## 2024-03-28 DIAGNOSIS — H353221 Exudative age-related macular degeneration, left eye, with active choroidal neovascularization: Secondary | ICD-10-CM

## 2024-03-28 DIAGNOSIS — H43823 Vitreomacular adhesion, bilateral: Secondary | ICD-10-CM

## 2024-03-28 DIAGNOSIS — H353211 Exudative age-related macular degeneration, right eye, with active choroidal neovascularization: Secondary | ICD-10-CM

## 2024-03-28 MED ORDER — BEVACIZUMAB CHEMO INJECTION 1.25MG/0.05ML SYRINGE FOR KALEIDOSCOPE
1.2500 mg | INTRAVITREAL | Status: AC | PRN
Start: 2024-03-28 — End: 2024-03-28
  Administered 2024-03-28: 1.25 mg via INTRAVITREAL

## 2024-03-28 MED ORDER — BEVACIZUMAB CHEMO INJECTION 1.25MG/0.05ML SYRINGE FOR KALEIDOSCOPE: 1.2500 mg | INTRAVITREAL | Status: AC | PRN

## 2024-05-09 NOTE — Progress Notes (Signed)
 [facility] - Clinic Note  05/17/2024     CHIEF COMPLAINT Patient presents for Retina Follow Up    HISTORY OF PRESENT ILLNESS: REI CONTEE is a 88 y.o. 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  This started 6.5 years ago.  Duration of 7 weeks.  Since onset it is stable.  I, the attending physician,  performed the HPI with the patient and updated documentation appropriately.        Comments   Pt states no changes in vision. Pt has started using drops in a vial PRN OU. Pt denies FOL/floaters/pain. Pt is using Systane Ultra TID OU, Genteal gel drops, Refresh Plus PF PRN OU.      Last edited by [name], MD on 05/17/2024  9:30 PM.     Pt states vision is the same, she uses AT's when she remembers, she is taking care of her sister, so it's hard for her to remember   Referring physician: [name], MD [address] [place],  [place] [address]  HISTORICAL INFORMATION:   Selected notes from the MEDICAL RECORD NUMBER Referred by Dr. KYM Ferrier for concern of AMD OU, CNV   CURRENT MEDICATIONS: Current Outpatient Medications (Ophthalmic Drugs)  Medication Sig   carboxymethylcellulose (REFRESH PLUS) 0.5 % SOLN Place 1 drop into both eyes 3 (three) times daily as needed (dry/irritated eyes).    prednisoLONE  acetate (PRED FORTE ) 1 % ophthalmic suspension Place 1 drop into the right eye 4 (four) times daily.   REFRESH OPTIVE MEGA-3 0.5-1-0.5 % SOLN Place 1 drop into both eyes 3 (three) times daily as needed (discomfort/eye irritation.).   No current facility-administered medications for this visit. (Ophthalmic Drugs)   Current Outpatient Medications (Other)  Medication Sig   amLODipine  (NORVASC ) 5 MG tablet TAKE 1 TABLET BY MOUTH DAILY   aspirin  EC 81 MG tablet Take 1 tablet (81 mg total) by mouth daily. Swallow whole.   calcium carbonate (OSCAL) 1500 (600 Ca) MG TABS tablet  Take 600 mg of elemental calcium by mouth 2 (two) times a day.   Cholecalciferol (VITAMIN D3) 50 MCG (2000 UT) TABS Take 2,000 Units by mouth daily.   nitroGLYCERIN  (NITROSTAT ) 0.4 MG SL tablet Place 1 tablet (0.4 mg total) under the tongue every 5 (five) minutes as needed for chest pain. (Patient not taking: Reported on 03/28/2024)   simvastatin (ZOCOR) 40 MG tablet Take 20 mg by mouth every evening. 1/4 tablet at night   Current Facility-Administered Medications (Other)  Medication Route   Bevacizumab  (AVASTIN ) SOLN 1.25 mg Intravitreal   Bevacizumab  (AVASTIN ) SOLN 1.25 mg Intravitreal   Bevacizumab  (AVASTIN ) SOLN 1.25 mg Intravitreal   Bevacizumab  (AVASTIN ) SOLN 1.25 mg Intravitreal   Bevacizumab  (AVASTIN ) SOLN 1.25 mg Intravitreal   Bevacizumab  (AVASTIN ) SOLN 1.25 mg Intravitreal   Bevacizumab  (AVASTIN ) SOLN 1.25 mg Intravitreal   Bevacizumab  (AVASTIN ) SOLN 1.25 mg Intravitreal   Bevacizumab  (AVASTIN ) SOLN 1.25 mg Intravitreal   Bevacizumab  (AVASTIN ) SOLN 1.25 mg Intravitreal   Bevacizumab  (AVASTIN ) SOLN 1.25 mg Intravitreal   Bevacizumab  (AVASTIN ) SOLN 1.25 mg Intravitreal   Bevacizumab  (AVASTIN ) SOLN 1.25 mg Intravitreal   REVIEW OF SYSTEMS: ROS   Positive for: Gastrointestinal, Eyes Negative for: Constitutional, Neurological, Skin, Genitourinary, Musculoskeletal, HENT, Endocrine, Cardiovascular, Respiratory, Psychiatric, Allergic/Imm, Heme/Lymph Last edited by Elnor Avelina RAMAN, COT on 05/17/2024  9:49 AM.  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   Headache    Hypercholesterolemia    Macular degeneration    Wet OU   Wears glasses    Past Surgical History:  Procedure Laterality Date   25 GAUGE PARS PLANA VITRECTOMY WITH 20 GAUGE MVR PORT FOR MACULAR HOLE Right 04/07/2019   Procedure: 25 GAUGE PARS PLANA VITRECTOMY WITH 20 GAUGE MVR  PORT FOR MACULAR HOLE;  Surgeon: [name], MD;  Location: [facility] OR;  Service: Ophthalmology;  Laterality: Right;   CATARACT EXTRACTION Bilateral    2013   DILATION AND CURETTAGE OF UTERUS     EYE SURGERY Bilateral 2013   Cat Sx   GAS/FLUID EXCHANGE Right 04/07/2019   Procedure: Gas/Fluid Exchange;  Surgeon: [name], MD;  Location: [facility] OR;  Service: Ophthalmology;  Laterality: Right;   MEMBRANE PEEL Right 04/07/2019   Procedure: Ottie Booty;  Surgeon: [name], MD;  Location: [facility] OR;  Service: Ophthalmology;  Laterality: Right;   PHOTOCOAGULATION WITH LASER Right 04/07/2019   Procedure: Photocoagulation With Laser;  Surgeon: [name], MD;  Location: [facility] OR;  Service: Ophthalmology;  Laterality: Right;   YAG LASER APPLICATION Bilateral    FAMILY HISTORY Family History  Problem Relation Age of Onset   Stroke Father    SOCIAL HISTORY Social History   Tobacco Use   Smoking status: Never   Smokeless tobacco: Never  Vaping Use   Vaping status: Never Used  Substance Use Topics   Alcohol use: No   Drug use: No       OPHTHALMIC EXAM: Base Eye Exam     Visual Acuity (Snellen - Linear)       Right Left   Dist [place] 20/70 -2 20/70 -2   Dist ph [place] 20/60 -2 20/50 -2         Tonometry (Tonopen, 9:55 AM)       Right Left   Pressure 16 16         Pupils       Pupils Dark Light Shape React APD   Right PERRL 3 2 Round Brisk None   Left PERRL 3 2 Round Brisk None         Visual Fields       Left Right    Full Full         Extraocular Movement       Right Left    Full, Ortho Full, Ortho         Neuro/Psych     Oriented x3: Yes   Mood/Affect: Normal         Dilation     Both eyes: 1.0% Mydriacyl , 2.5% Phenylephrine  @ 9:55 AM           Slit Lamp and Fundus Exam     Slit Lamp Exam       Right Left   Lids/Lashes Dermatochalasis - upper lid, periorbital edema Dermatochalasis - upper lid   Conjunctiva/Sclera White and quiet White and  quiet   Cornea Arcus, 1+ fine Punctate epithelial erosions, mild endo pigment Arcus, 1-2+ inferior Punctate epithelial erosions, mild tear film debris, mild endo pigment, decreased TBUT   Anterior Chamber Deep, quiet Deep and quiet   Iris Round and dilated Round and dilated   Lens PC IOL in good postion with anterior capsular phimosis, 1+ Posterior capsular  opacification PC IOL in good postion with anterior capsular phimosis, 1+ Posterior capsular opacification sparing center   Anterior Vitreous post vitrectomy Vitreous syneresis         Fundus Exam       Right Left   Disc Sharp rim, mild pallor, peripapillary drusen, PPP, +cupping Pink and Sharp, central pallor, +cupping, +PPP, peripapillary drusen   C/D Ratio 0.65 0.75   Macula flat; ERM/VMT gone, blunted foveal reflex, refractile drusen, trace cystic changes -- stably improved, pigment clumping, focal IRH centrally - stably resolved, scattered atrophy, no heme Blunted foveal reflex, refractile Drusen, early Atrophy, RPE mottling and clumping, punctate CNV / IRH nasal macula with cystic changes -- stably improved, no heme   Vessels attenuated, Tortuous attenuated, Tortuous   Periphery Attached, scattered peripheral drusen, peripheral laser changes superiorly Attached with peripheral drusen           IMAGING AND PROCEDURES  Imaging and Procedures for 02/09/18  OCT, Retina - OU - Both Eyes       Right Eye Quality was good. Central Foveal Thickness: 212. Progression has been stable. Findings include no IRF, no SRF, abnormal foveal contour, retinal drusen , subretinal hyper-reflective material, intraretinal hyper-reflective material, pigment epithelial detachment, outer retinal atrophy (stable improvement in IRF and SRHM temporal macula, stable improvement in IRF / PED IN macula).   Left Eye Quality was good. Central Foveal Thickness: 201. Progression has been stable. Findings include no SRF, abnormal foveal contour, retinal drusen ,  subretinal hyper-reflective material, epiretinal membrane, intraretinal fluid, pigment epithelial detachment, vitreous traction, outer retinal atrophy (stable improvement in IRF / PED nasal macula, persistent VMT w/ cystic changes -- stably improved, low PED / [facility] inferior macula).   Notes Images taken, stored on drive  Diagnosis / Impression:  OD: Exudative ARMD -- stable improvement in IRF and SRHM temporal macula, stable improvement in IRF / PED IN macula OS: exudative ARMD -- stable improvement in IRF / PED nasal macula, persistent VMT w/ cystic changes -- stably improved, low PED / [facility] inferior macula  Clinical management:  See below  Abbreviations: NFP - Normal foveal profile. CME - cystoid macular edema. PED - pigment epithelial detachment. IRF - intraretinal fluid. SRF - subretinal fluid. EZ - ellipsoid zone. ERM - epiretinal membrane. ORA - outer retinal atrophy. ORT - outer retinal tubulation. SRHM - subretinal hyper-reflective material       Intravitreal Injection, Pharmacologic Agent - OS - Left Eye       Time Out 05/17/2024. 10:41 AM. Confirmed correct patient, procedure, site, and patient consented.   Anesthesia Topical anesthesia was used. Anesthetic medications included Lidocaine  2%, Proparacaine  0.5%.   Procedure Preparation included 5% betadine to ocular surface, eyelid speculum. A supplied (32g) needle was used.   Injection: 1.25 mg Bevacizumab  1.25mg /0.6ml   Route: Intravitreal, Site: Left Eye   NDC: C2662926, Lot: [id number]$ , Expiration date: 06/25/2024   Post-op Post injection exam found visual acuity of at least counting fingers. The patient tolerated the procedure well. There were no complications. The patient received written and verbal post procedure care education. Post injection medications were not given.      Intravitreal Injection, Pharmacologic Agent - OD - Right Eye       Time Out 05/17/2024. 10:42 AM. Confirmed correct patient,  procedure, site, and patient consented.   Anesthesia Topical anesthesia was used. Anesthetic medications included Lidocaine  2%, Proparacaine  0.5%.   Procedure Preparation included 5% betadine to ocular surface, eyelid speculum. A supplied (32g) needle was  used.   Injection: 1.25 mg Bevacizumab  1.25mg /0.49ml   Route: Intravitreal, Site: Right Eye   NDC: H525437, Lot: [id number], Expiration date: 06/06/2024   Post-op Post injection exam found visual acuity of at least counting fingers. The patient tolerated the procedure well. There were no complications. The patient received written and verbal post procedure care education. Post injection medications were not given.              ASSESSMENT/PLAN:    ICD-10-CM   1. Exudative age-related macular degeneration of right eye with active choroidal neovascularization (HCC)  H35.3211 OCT, Retina - OU - Both Eyes    Intravitreal Injection, Pharmacologic Agent - OD - Right Eye    Bevacizumab  (AVASTIN ) SOLN 1.25 mg    2. Exudative age-related macular degeneration of left eye with active choroidal neovascularization (HCC)  H35.3221 OCT, Retina - OU - Both Eyes    Intravitreal Injection, Pharmacologic Agent - OS - Left Eye    Bevacizumab  (AVASTIN ) SOLN 1.25 mg    3. 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 Pseudophakia of both eyes  Z96.1        1. 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  - S/P IVA OD #1 (12.03.18), #2 (01.02.19), #3 (01.30.19), #4 (03.01.19), #5 (04.01.19), #6 (04.30.19), #7 (06.05.19), #8 (07.19.19), #9 (08.30.19), #10 (10.14.19), #11 (11.18.19), #12 (12.30.19), #13 (02.10.20), #14 (04.06.20), #15 (09.17.20), #16 (10.15.20), #17 (12.01.20), #18 (01.18.21), #19 (03.15.21), #20 (5.11.21), #21 (07.20.21), #22 (10.14.21), #23 (01.10.22), #24 (03.07.22), #24 (05.23.22), #25 (9.1.22), #26 (11.01.22), #27 (12.28.22), # 28 (02.14.23), #29 (03.28.23), #30 (01.20.25), #31  (03.10.25), #32 (04.28.25), #33 (06.16.25)  - s/p IVE OD #1 (05.09.23) sample, #2 (06.14.23), #3 (07.12.23), #4 (08.22.23), #5 (10.03.23)., #6 (11.21.23), #7 (01.09.24), #8 (02.27.24), #9 (04.23.24), #10 (06.17.24), #11 (08.12.24), #12 (09.09.24), #13 (10.15.24), #14 (11.25.24)  - FA (04.01.19) shows mild CNVM OD consistent with exudative ARMD  - OCT shows OD: stable improvement in IRF and SRHM temporal macula, mild interval improvement in IRF / PED IN macula at 7 weeks  - BCVA OD improved to 20/60 from 20/70  - recommend IVA OD #34 today, 08.05.25 w/ f/u ext to 8 wks  - pt wishes to proceed with Avastin   - RBA of procedure discussed, questions answered  - see procedure note  - Avastin  informed consent form signed and scanned on 01.20.25 (OU)  - f/u 8 weeks -- DFE/OCT/possible injection  2. Age related macular degeneration, exudative, OS  - interval conversion from non-exudative to exudative noted 07.20.21  - s/p IVA OS #1 (07.20.21), #2 (08.19.21), #3 (09.16.21), #4 (10.14.21), #5 (11.29.21), #6 (01.10.22), #7 (03.07.22), #8 (05.23.22), #9 (09.01.22), #10 (03.28.23), #11 (05.09.23), #12 (06.14.23), #13 (07.12.23), #14 (08.22.23), #15 (10.03.23), #16 (11.21.23), #17 (01.09.24), #18 (02.27.24), #19 (01.20.25), #20 (03.10.25), #21 (04.28.25), #22 (06.16.25)  - s/p IVE OS #1 (08.12.24), #2 (09.09.24), #3 (10.15.24), #4 (11.25.24)  - exam shows no heme  - OCT with OS: stable improvement in IRF / PED nasal macula, persistent VMT w/ cystic changes -- improved, low PED / [facility] inferior macula at 7 wks  - BCVA OS 20/50 from 20/60  - recommend IVA OS #23 today, 08.05.25 w/ f/u ext to 8 wks  - pt wishes to proceed with injection  - RBA of procedure discussed, questions answered - IVA informed consent obtained and signed, 01.20.25 (OU) - see procedure note - f/u 8 weeks DFE, OCT  3. 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 Pseudophakia OU  - s/p CE/IOL OU  - s/p YAG OU  - beautiful surgeries, doing well  - monitor  Ophthalmic Meds Ordered this visit:  Meds ordered this encounter  Medications   Bevacizumab  (AVASTIN ) SOLN 1.25 mg   Bevacizumab  (AVASTIN ) SOLN 1.25 mg     Return in about 8 weeks (around 07/12/2024) for exu ARMD OU, DFE, OCT, Possible Injxn.  There are no Patient Instructions on file for this visit.  This document serves as a record of services personally performed by Redell JUDITHANN Hans, MD, PhD. It was created on their behalf by Avelina Pereyra, COA an ophthalmic technician. The creation of this record is the provider's dictation and/or activities during the visit.   Electronically signed by: Avelina GORMAN Pereyra, COT  05/17/24  9:30 PM   This document serves as a record of services personally performed by Redell JUDITHANN Hans, MD, PhD. It was created on their behalf by Alan PARAS. Delores, OA an ophthalmic technician. The creation of this record is the provider's dictation and/or activities during the visit.    Electronically signed by: Alan PARAS. Delores, OA 05/17/24 9:30 PM   Redell JUDITHANN Hans, M.D., Ph.D. Diseases & Surgery of the Retina and Vitreous [facility]  I have reviewed the above documentation for accuracy and completeness, and I agree with the above. Redell JUDITHANN Hans, M.D., Ph.D. 05/17/24 9:33 PM   Abbreviations: M myopia (nearsighted); A astigmatism; H hyperopia (farsighted); P presbyopia; Mrx spectacle prescription;  CTL contact lenses; OD right eye; OS left eye; OU both eyes  XT exotropia; ET esotropia; PEK punctate epithelial keratitis; PEE punctate epithelial erosions; DES dry eye syndrome; MGD  meibomian gland dysfunction; ATs artificial tears; PFAT's preservative free artificial tears; NSC nuclear sclerotic cataract; PSC posterior subcapsular cataract; ERM epi-retinal membrane; PVD posterior vitreous detachment; RD retinal detachment; DM diabetes mellitus; DR diabetic retinopathy; NPDR non-proliferative diabetic retinopathy; PDR proliferative diabetic retinopathy; CSME clinically significant macular edema; DME diabetic macular edema; dbh dot blot hemorrhages; CWS cotton wool spot; POAG primary open angle glaucoma; C/D cup-to-disc ratio; HVF humphrey visual field; GVF goldmann visual field; OCT optical coherence tomography; IOP intraocular pressure; BRVO Branch retinal vein occlusion; CRVO central retinal vein occlusion; CRAO central retinal artery occlusion; BRAO branch retinal artery occlusion; RT retinal tear; SB scleral buckle; PPV pars plana vitrectomy; VH Vitreous hemorrhage; PRP panretinal laser photocoagulation; IVK intravitreal kenalog ; VMT vitreomacular traction; MH Macular hole;  NVD neovascularization of the disc; NVE neovascularization elsewhere; AREDS age related eye disease study; ARMD age related macular degeneration; POAG primary open angle glaucoma; EBMD epithelial/anterior basement membrane dystrophy; ACIOL anterior chamber intraocular lens; IOL intraocular lens; PCIOL posterior chamber intraocular lens; Phaco/IOL phacoemulsification with intraocular lens placement; PRK photorefractive keratectomy; LASIK laser assisted in situ keratomileusis; HTN hypertension; DM diabetes mellitus; COPD chronic obstructive pulmonary disease

## 2024-05-17 ENCOUNTER — Encounter (INDEPENDENT_AMBULATORY_CARE_PROVIDER_SITE_OTHER): Payer: Self-pay | Admitting: Ophthalmology

## 2024-05-17 ENCOUNTER — Ambulatory Visit (INDEPENDENT_AMBULATORY_CARE_PROVIDER_SITE_OTHER): Admitting: Ophthalmology

## 2024-05-17 DIAGNOSIS — H353221 Exudative age-related macular degeneration, left eye, with active choroidal neovascularization: Secondary | ICD-10-CM

## 2024-05-17 DIAGNOSIS — H43823 Vitreomacular adhesion, bilateral: Secondary | ICD-10-CM

## 2024-05-17 DIAGNOSIS — H353231 Exudative age-related macular degeneration, bilateral, with active choroidal neovascularization: Secondary | ICD-10-CM | POA: Diagnosis not present

## 2024-05-17 DIAGNOSIS — Z961 Presence of intraocular lens: Secondary | ICD-10-CM

## 2024-05-17 DIAGNOSIS — H353211 Exudative age-related macular degeneration, right eye, with active choroidal neovascularization: Secondary | ICD-10-CM

## 2024-05-17 MED ORDER — BEVACIZUMAB CHEMO INJECTION 1.25MG/0.05ML SYRINGE FOR KALEIDOSCOPE
1.2500 mg | INTRAVITREAL | Status: AC | PRN
Start: 2024-05-17 — End: 2024-05-17
  Administered 2024-05-17: 1.25 mg via INTRAVITREAL

## 2024-05-17 MED ORDER — BEVACIZUMAB CHEMO INJECTION 1.25MG/0.05ML SYRINGE FOR KALEIDOSCOPE
1.2500 mg | INTRAVITREAL | Status: AC | PRN
  Administered 2024-05-17: 1.25 mg via INTRAVITREAL

## 2024-06-29 NOTE — Progress Notes (Signed)
 [facility] - Clinic Note  07/12/2024     CHIEF COMPLAINT Patient presents for Retina Follow Up    HISTORY OF PRESENT ILLNESS: Lindsay Harvey is a 88 y.o. 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  This started 6.5 years ago.  Duration of 8 weeks.  Since onset it is stable.  I, the attending physician,  performed the HPI with the patient and updated documentation appropriately.        Comments   Pt states no changes in vision. Pt denies FOL/floaters/pain. Eyes have been feeling less dry since she came back from [place] . Pt is consistent with Systane Ultra TID OU, Genteal gel drops, Refresh Plus PF PRN OU.      Last edited by [name], MD on 07/12/2024  5:00 PM.    Pt states she's doing well, no changes in vision.   Referring physician: [name], MD [address] [facility] [address] [place],  [place] [address]  HISTORICAL INFORMATION:   Selected notes from the MEDICAL RECORD NUMBER Referred by Dr. KYM Ferrier for concern of AMD OU, CNV   CURRENT MEDICATIONS: Current Outpatient Medications (Ophthalmic Drugs)  Medication Sig   carboxymethylcellulose (REFRESH PLUS) 0.5 % SOLN Place 1 drop into both eyes 3 (three) times daily as needed (dry/irritated eyes).    prednisoLONE  acetate (PRED FORTE ) 1 % ophthalmic suspension Place 1 drop into the right eye 4 (four) times daily.   REFRESH OPTIVE MEGA-3 0.5-1-0.5 % SOLN Place 1 drop into both eyes 3 (three) times daily as needed (discomfort/eye irritation.).   No current facility-administered medications for this visit. (Ophthalmic Drugs)   Current Outpatient Medications (Other)  Medication Sig   amLODipine  (NORVASC ) 5 MG tablet TAKE 1 TABLET BY MOUTH DAILY   aspirin  EC 81 MG tablet Take 1 tablet (81 mg total) by mouth daily. Swallow whole.   calcium carbonate (OSCAL) 1500 (600 Ca) MG TABS tablet Take 600 mg of elemental calcium by mouth 2  (two) times a day.   Cholecalciferol (VITAMIN D3) 50 MCG (2000 UT) TABS Take 2,000 Units by mouth daily.   nitroGLYCERIN  (NITROSTAT ) 0.4 MG SL tablet Place 1 tablet (0.4 mg total) under the tongue every 5 (five) minutes as needed for chest pain. (Patient not taking: Reported on 03/28/2024)   simvastatin (ZOCOR) 40 MG tablet Take 20 mg by mouth every evening. 1/4 tablet at night   Current Facility-Administered Medications (Other)  Medication Route   Bevacizumab  (AVASTIN ) SOLN 1.25 mg Intravitreal   Bevacizumab  (AVASTIN ) SOLN 1.25 mg Intravitreal   Bevacizumab  (AVASTIN ) SOLN 1.25 mg Intravitreal   Bevacizumab  (AVASTIN ) SOLN 1.25 mg Intravitreal   Bevacizumab  (AVASTIN ) SOLN 1.25 mg Intravitreal   Bevacizumab  (AVASTIN ) SOLN 1.25 mg Intravitreal   Bevacizumab  (AVASTIN ) SOLN 1.25 mg Intravitreal   Bevacizumab  (AVASTIN ) SOLN 1.25 mg Intravitreal   Bevacizumab  (AVASTIN ) SOLN 1.25 mg Intravitreal   Bevacizumab  (AVASTIN ) SOLN 1.25 mg Intravitreal   Bevacizumab  (AVASTIN ) SOLN 1.25 mg Intravitreal   Bevacizumab  (AVASTIN ) SOLN 1.25 mg Intravitreal   Bevacizumab  (AVASTIN ) SOLN 1.25 mg Intravitreal   REVIEW OF SYSTEMS: ROS   Positive for: Gastrointestinal, Eyes Negative for: Constitutional, Neurological, Skin, Genitourinary, Musculoskeletal, HENT, Endocrine, Cardiovascular, Respiratory, Psychiatric, Allergic/Imm, Heme/Lymph Last edited by Elnor Avelina RAMAN, COT on 07/12/2024  2:17 PM.     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   Headache    Hypercholesterolemia    Macular degeneration    Wet OU   Wears glasses    Past Surgical History:  Procedure Laterality Date   25 GAUGE PARS PLANA VITRECTOMY WITH 20 GAUGE MVR PORT FOR MACULAR HOLE Right 04/07/2019   Procedure: 25 GAUGE PARS PLANA VITRECTOMY WITH 20 GAUGE MVR PORT FOR MACULAR HOLE;  Surgeon: [name],  MD;  Location: [facility] OR;  Service: Ophthalmology;  Laterality: Right;   CATARACT EXTRACTION Bilateral    2013   DILATION AND CURETTAGE OF UTERUS     EYE SURGERY Bilateral 2013   Cat Sx   GAS/FLUID EXCHANGE Right 04/07/2019   Procedure: Gas/Fluid Exchange;  Surgeon: [name], MD;  Location: [facility] OR;  Service: Ophthalmology;  Laterality: Right;   MEMBRANE PEEL Right 04/07/2019   Procedure: Ottie Booty;  Surgeon: [name], MD;  Location: [facility] OR;  Service: Ophthalmology;  Laterality: Right;   PHOTOCOAGULATION WITH LASER Right 04/07/2019   Procedure: Photocoagulation With Laser;  Surgeon: [name], MD;  Location: [facility] OR;  Service: Ophthalmology;  Laterality: Right;   YAG LASER APPLICATION Bilateral    FAMILY HISTORY Family History  Problem Relation Age of Onset   Stroke Father    SOCIAL HISTORY Social History   Tobacco Use   Smoking status: Never   Smokeless tobacco: Never  Vaping Use   Vaping status: Never Used  Substance Use Topics   Alcohol use: No   Drug use: No       OPHTHALMIC EXAM: Base Eye Exam     Visual Acuity (Snellen - Linear)       Right Left   Dist [place] 20/80 -2 20/60 -2   Dist ph [place] 20/60 -1 20/50 +2         Tonometry (Tonopen, 2:15 PM)       Right Left   Pressure 12 12         Pupils       Pupils Dark Light Shape React APD   Right PERRL 3 2 Round Brisk None   Left PERRL 3 2 Round Brisk None         Visual Fields       Left Right    Full Full         Extraocular Movement       Right Left    Full, Ortho Full, Ortho         Neuro/Psych     Oriented x3: Yes   Mood/Affect: Normal         Dilation     Both eyes: 1.0% Mydriacyl , 2.5% Phenylephrine  @ 2:15 PM           Slit Lamp and Fundus Exam     Slit Lamp Exam       Right Left   Lids/Lashes Dermatochalasis - upper lid, periorbital edema Dermatochalasis - upper lid   Conjunctiva/Sclera White and quiet White and quiet   Cornea Arcus, 1+ fine Punctate  epithelial erosions, mild endo pigment Arcus, 1-2+ inferior Punctate epithelial erosions, mild tear film debris, mild endo pigment, decreased TBUT   Anterior Chamber Deep, quiet Deep and quiet   Iris Round and dilated Round and dilated   Lens PC IOL in good postion with anterior capsular phimosis, 1+ Posterior capsular opacification PC IOL in good postion with anterior capsular phimosis, 1+ Posterior capsular  opacification sparing center   Anterior Vitreous post vitrectomy Vitreous syneresis         Fundus Exam       Right Left   Disc Sharp rim, mild pallor, peripapillary drusen, PPP, +cupping Pink and Sharp, central pallor, +cupping, +PPP, peripapillary drusen   C/D Ratio 0.65 0.75   Macula flat; ERM/VMT gone, blunted foveal reflex, refractile drusen, trace cystic changes -- stably improved, pigment clumping, focal IRH centrally - stably resolved, scattered atrophy, no heme Blunted foveal reflex, refractile Drusen, early Atrophy, RPE mottling and clumping, punctate CNV / IRH nasal macula with cystic changes -- stably improved, no heme   Vessels attenuated, Tortuous attenuated, Tortuous   Periphery Attached, scattered peripheral drusen, peripheral laser changes superiorly Attached with peripheral drusen           IMAGING AND PROCEDURES  Imaging and Procedures for 02/09/18  OCT, Retina - OU - Both Eyes       Right Eye Quality was good. Central Foveal Thickness: 228. Progression has been stable. Findings include no IRF, no SRF, abnormal foveal contour, retinal drusen , subretinal hyper-reflective material, intraretinal hyper-reflective material, pigment epithelial detachment, outer retinal atrophy (stable improvement in IRF and SRHM temporal macula, stable improvement in IRF / PED IN macula, no new fluid).   Left Eye Quality was good. Central Foveal Thickness: 208. Progression has been stable. Findings include no SRF, abnormal foveal contour, retinal drusen , subretinal  hyper-reflective material, epiretinal membrane, intraretinal fluid, pigment epithelial detachment, vitreous traction, outer retinal atrophy (stable improvement in IRF / PED nasal macula, persistent VMT w/ cystic changes -- stably improved, low PED / [facility] inferior macula).   Notes Images taken, stored on drive  Diagnosis / Impression:  OD: Exudative ARMD -- stable improvement in IRF and SRHM temporal macula, stable improvement in IRF / PED IN macula, no new fluid OS: exudative ARMD -- stable improvement in IRF / PED nasal macula, persistent VMT w/ cystic changes -- stably improved, low PED / [facility] inferior macula  Clinical management:  See below  Abbreviations: NFP - Normal foveal profile. CME - cystoid macular edema. PED - pigment epithelial detachment. IRF - intraretinal fluid. SRF - subretinal fluid. EZ - ellipsoid zone. ERM - epiretinal membrane. ORA - outer retinal atrophy. ORT - outer retinal tubulation. SRHM - subretinal hyper-reflective material       Intravitreal Injection, Pharmacologic Agent - OD - Right Eye       Time Out 07/12/2024. 2:42 PM. Confirmed correct patient, procedure, site, and patient consented.   Anesthesia Topical anesthesia was used. Anesthetic medications included Lidocaine  2%, Proparacaine  0.5%.   Procedure Preparation included 5% betadine to ocular surface, eyelid speculum. A (32g) needle was used.   Injection: 1.25 mg Bevacizumab  1.25mg /0.49ml   Route: Intravitreal, Site: Right Eye   NDC: H525437, Lot: [id number], Expiration date: 10/22/2024   Post-op Post injection exam found visual acuity of at least counting fingers. The patient tolerated the procedure well. There were no complications. The patient received written and verbal post procedure care education. Post injection medications were not given.      Intravitreal Injection, Pharmacologic Agent - OS - Left Eye       Time Out 07/12/2024. 2:43 PM. Confirmed correct patient, procedure, site,  and patient consented.   Anesthesia Topical anesthesia was used. Anesthetic medications included Lidocaine  2%, Proparacaine  0.5%.   Procedure Preparation included 5% betadine to ocular surface, eyelid speculum. A supplied (32g) needle was used.   Injection: 1.25 mg Bevacizumab  1.25mg /0.16ml  Route: Intravitreal, Site: Left Eye   NDC: H525437, Lot: [id number], Expiration date: 10/22/2024   Post-op Post injection exam found visual acuity of at least counting fingers. The patient tolerated the procedure well. There were no complications. The patient received written and verbal post procedure care education. Post injection medications were not given.            ASSESSMENT/PLAN:    ICD-10-CM   1. Exudative age-related macular degeneration of right eye with active choroidal neovascularization (HCC)  H35.3211 OCT, Retina - OU - Both Eyes    Intravitreal Injection, Pharmacologic Agent - OD - Right Eye    Bevacizumab  (AVASTIN ) SOLN 1.25 mg    2. Exudative age-related macular degeneration of left eye with active choroidal neovascularization (HCC)  H35.3221 Intravitreal Injection, Pharmacologic Agent - OS - Left Eye    Bevacizumab  (AVASTIN ) SOLN 1.25 mg    3. 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 Pseudophakia of both eyes  Z96.1      1. 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  - S/P IVA OD #1 (12.03.18), #2 (01.02.19), #3 (01.30.19), #4 (03.01.19), #5 (04.01.19), #6 (04.30.19), #7 (06.05.19), #8 (07.19.19), #9 (08.30.19), #10 (10.14.19), #11 (11.18.19), #12 (12.30.19), #13 (02.10.20), #14 (04.06.20), #15 (09.17.20), #16 (10.15.20), #17 (12.01.20), #18 (01.18.21), #19 (03.15.21), #20 (5.11.21), #21 (07.20.21), #22 (10.14.21), #23 (01.10.22), #24 (03.07.22), #24 (05.23.22), #25 (9.1.22), #26 (11.01.22), #27 (12.28.22), # 28 (02.14.23), #29 (03.28.23), #30 (01.20.25), #31 (03.10.25), #32 (04.28.25), #33 (06.16.25), #34  (08.05.25)  ===============  - s/p IVE OD #1 (05.09.23) sample, #2 (06.14.23), #3 (07.12.23), #4 (08.22.23), #5 (10.03.23)., #6 (11.21.23), #7 (01.09.24), #8 (02.27.24), #9 (04.23.24), #10 (06.17.24), #11 (08.12.24), #12 (09.09.24), #13 (10.15.24), #14 (11.25.24)  - FA (04.01.19) shows mild CNVM OD consistent with exudative ARMD  - OCT shows OD: stable improvement in IRF and SRHM temporal macula, stable improvement in IRF / PED IN macula, no new fluid at 8 weeks  - BCVA OD 20/60 stable  - recommend IVA OD #35 today, 09.30.25 w/ f/u ext to 9 wks  - pt wishes to proceed with Avastin   - RBA of procedure discussed, questions answered  - see procedure note  - Avastin  informed consent form signed and scanned on 01.20.25 (OU)  - f/u 9 weeks -- DFE/OCT/possible injection  2. Age related macular degeneration, exudative, OS  - interval conversion from non-exudative to exudative noted 07.20.21  - s/p IVA OS #1 (07.20.21), #2 (08.19.21), #3 (09.16.21), #4 (10.14.21), #5 (11.29.21), #6 (01.10.22), #7 (03.07.22), #8 (05.23.22), #9 (09.01.22), #10 (03.28.23), #11 (05.09.23), #12 (06.14.23), #13 (07.12.23), #14 (08.22.23), #15 (10.03.23), #16 (11.21.23), #17 (01.09.24), #18 (02.27.24), #19 (01.20.25), #20 (03.10.25), #21 (04.28.25), #22 (06.16.25), #23 (08.05.25)  =================  - s/p IVE OS #1 (08.12.24), #2 (09.09.24), #3 (10.15.24), #4 (11.25.24)  - exam shows no heme  - OCT with OS: stable improvement in IRF / PED nasal macula, persistent VMT w/ cystic changes -- stably improved, low PED / [facility] inferior macula at 8 wks  - BCVA OS 20/50 stable  - recommend IVA OS #24 today, 09.30.25 w/ f/u ext to 9 wks  - pt wishes to proceed with injection  - RBA of procedure discussed, questions answered - IVA informed consent obtained and signed, 01.20.25 (OU) - see procedure note - f/u 9 weeks DFE, OCT  3. 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 Pseudophakia OU  - s/p CE/IOL OU  - s/p YAG OU  - beautiful surgeries, doing well  - monitor  Ophthalmic Meds Ordered this visit:  Meds ordered this encounter  Medications   Bevacizumab  (AVASTIN ) SOLN 1.25 mg   Bevacizumab  (AVASTIN ) SOLN 1.25 mg     Return in about 9 weeks (around 09/13/2024) for exu ARMD OU, DFE, OCT, Possible Injxn.  There are no Patient Instructions on file for this visit.  This document serves as a record of services personally performed by Redell JUDITHANN Hans, MD, PhD. It was created on their behalf by Wanda GEANNIE Keens, COT an ophthalmic technician. The creation of this record is the provider's dictation and/or activities during the visit.    Electronically signed by:  Wanda GEANNIE Keens, COT  07/12/24 5:02 PM  Redell JUDITHANN Hans, M.D., Ph.D. Diseases & Surgery of the Retina and Vitreous [facility]  I have reviewed the above documentation for accuracy and completeness, and I agree with the above. Redell JUDITHANN Hans, M.D., Ph.D. 07/12/24 5:04 PM   Abbreviations: M myopia (nearsighted); A astigmatism; H hyperopia (farsighted); P presbyopia; Mrx spectacle prescription;  CTL contact lenses; OD right eye; OS left eye; OU both eyes  XT exotropia; ET esotropia; PEK punctate epithelial keratitis; PEE punctate epithelial erosions; DES dry eye syndrome; MGD meibomian gland dysfunction; ATs artificial tears; PFAT's preservative free artificial tears; NSC nuclear sclerotic cataract; PSC posterior subcapsular cataract; ERM epi-retinal membrane; PVD posterior vitreous detachment; RD retinal detachment; DM diabetes mellitus; DR diabetic retinopathy; NPDR non-proliferative  diabetic retinopathy; PDR proliferative diabetic retinopathy; CSME clinically significant macular edema; DME diabetic macular edema; dbh dot blot hemorrhages; CWS cotton wool spot; POAG primary open angle glaucoma; C/D cup-to-disc ratio; HVF humphrey visual field; GVF goldmann visual field; OCT optical coherence tomography; IOP intraocular pressure; BRVO Branch retinal vein occlusion; CRVO central retinal vein occlusion; CRAO central retinal artery occlusion; BRAO branch retinal artery occlusion; RT retinal tear; SB scleral buckle; PPV pars plana vitrectomy; VH Vitreous hemorrhage; PRP panretinal laser photocoagulation; IVK intravitreal kenalog ; VMT vitreomacular traction; MH Macular hole;  NVD neovascularization of the disc; NVE neovascularization elsewhere; AREDS age related eye disease study; ARMD age related macular degeneration; POAG primary open angle glaucoma; EBMD epithelial/anterior basement membrane dystrophy; ACIOL anterior chamber intraocular lens; IOL intraocular lens; PCIOL posterior chamber intraocular lens; Phaco/IOL phacoemulsification with intraocular lens placement; PRK photorefractive keratectomy; LASIK laser assisted in situ keratomileusis; HTN hypertension; DM diabetes mellitus; COPD chronic obstructive pulmonary disease

## 2024-07-12 ENCOUNTER — Encounter (INDEPENDENT_AMBULATORY_CARE_PROVIDER_SITE_OTHER): Payer: Self-pay | Admitting: Ophthalmology

## 2024-07-12 ENCOUNTER — Ambulatory Visit (INDEPENDENT_AMBULATORY_CARE_PROVIDER_SITE_OTHER): Admitting: Ophthalmology

## 2024-07-12 DIAGNOSIS — H43823 Vitreomacular adhesion, bilateral: Secondary | ICD-10-CM

## 2024-07-12 DIAGNOSIS — Z961 Presence of intraocular lens: Secondary | ICD-10-CM | POA: Diagnosis not present

## 2024-07-12 DIAGNOSIS — H353211 Exudative age-related macular degeneration, right eye, with active choroidal neovascularization: Secondary | ICD-10-CM

## 2024-07-12 DIAGNOSIS — H353231 Exudative age-related macular degeneration, bilateral, with active choroidal neovascularization: Secondary | ICD-10-CM

## 2024-07-12 DIAGNOSIS — H353221 Exudative age-related macular degeneration, left eye, with active choroidal neovascularization: Secondary | ICD-10-CM

## 2024-07-12 MED ORDER — BEVACIZUMAB CHEMO INJECTION 1.25MG/0.05ML SYRINGE FOR KALEIDOSCOPE
1.2500 mg | INTRAVITREAL | Status: AC | PRN
  Administered 2024-07-12: 1.25 mg via INTRAVITREAL

## 2024-08-30 NOTE — Progress Notes (Shared)
 [facility] - Clinic Note  09/13/2024     CHIEF COMPLAINT Patient presents for Retina Follow Up    HISTORY OF PRESENT ILLNESS: Lindsay Harvey is a 88 y.o. 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  This started 6.5 years ago.  Duration of 8 weeks.  Since onset it is stable.  I, the attending physician,  performed the HPI with the patient and updated documentation appropriately.        Comments   Pt denies any changes in vision/no FOL/floaters/pain. Pt is only needing Systane 1-2 times per day but does not use it everyday.      Last edited by [name], MD on 09/13/2024  5:08 PM.     Pt states vision is doing well, happy with the Ats she's been using. Dryness has improved.   Referring physician: [name], MD [address] [place],  [place] [address]  HISTORICAL INFORMATION:   Selected notes from the MEDICAL RECORD NUMBER Referred by Dr. KYM Ferrier for concern of AMD OU, CNV   CURRENT MEDICATIONS: Current Outpatient Medications (Ophthalmic Drugs)  Medication Sig   carboxymethylcellulose (REFRESH PLUS) 0.5 % SOLN Place 1 drop into both eyes 3 (three) times daily as needed (dry/irritated eyes).    prednisoLONE  acetate (PRED FORTE ) 1 % ophthalmic suspension Place 1 drop into the right eye 4 (four) times daily.   REFRESH OPTIVE MEGA-3 0.5-1-0.5 % SOLN Place 1 drop into both eyes 3 (three) times daily as needed (discomfort/eye irritation.).   No current facility-administered medications for this visit. (Ophthalmic Drugs)   Current Outpatient Medications (Other)  Medication Sig   amLODipine  (NORVASC ) 5 MG tablet TAKE 1 TABLET BY MOUTH DAILY   aspirin  EC 81 MG tablet Take 1 tablet (81 mg total) by mouth daily. Swallow whole.   calcium carbonate (OSCAL) 1500 (600 Ca) MG TABS tablet Take 600 mg of elemental calcium by mouth 2 (two) times a day.   Cholecalciferol (VITAMIN D3)  50 MCG (2000 UT) TABS Take 2,000 Units by mouth daily.   nitroGLYCERIN  (NITROSTAT ) 0.4 MG SL tablet Place 1 tablet (0.4 mg total) under the tongue every 5 (five) minutes as needed for chest pain. (Patient not taking: Reported on 03/28/2024)   simvastatin (ZOCOR) 40 MG tablet Take 20 mg by mouth every evening. 1/4 tablet at night   Current Facility-Administered Medications (Other)  Medication Route   Bevacizumab  (AVASTIN ) SOLN 1.25 mg Intravitreal   Bevacizumab  (AVASTIN ) SOLN 1.25 mg Intravitreal   Bevacizumab  (AVASTIN ) SOLN 1.25 mg Intravitreal   Bevacizumab  (AVASTIN ) SOLN 1.25 mg Intravitreal   Bevacizumab  (AVASTIN ) SOLN 1.25 mg Intravitreal   Bevacizumab  (AVASTIN ) SOLN 1.25 mg Intravitreal   Bevacizumab  (AVASTIN ) SOLN 1.25 mg Intravitreal   Bevacizumab  (AVASTIN ) SOLN 1.25 mg Intravitreal   Bevacizumab  (AVASTIN ) SOLN 1.25 mg Intravitreal   Bevacizumab  (AVASTIN ) SOLN 1.25 mg Intravitreal   Bevacizumab  (AVASTIN ) SOLN 1.25 mg Intravitreal   Bevacizumab  (AVASTIN ) SOLN 1.25 mg Intravitreal   Bevacizumab  (AVASTIN ) SOLN 1.25 mg Intravitreal   REVIEW OF SYSTEMS: ROS   Positive for: Gastrointestinal, Eyes Negative for: Constitutional, Neurological, Skin, Genitourinary, Musculoskeletal, HENT, Endocrine, Cardiovascular, Respiratory, Psychiatric, Allergic/Imm, Heme/Lymph Last edited by Elnor Avelina RAMAN, COT on 09/13/2024  1:56 PM.      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   Headache    Hypercholesterolemia    Macular degeneration    Wet OU   Wears glasses    Past Surgical History:  Procedure Laterality Date   25 GAUGE PARS PLANA VITRECTOMY WITH 20 GAUGE MVR PORT FOR MACULAR HOLE Right 04/07/2019   Procedure: 25 GAUGE PARS PLANA VITRECTOMY WITH 20 GAUGE MVR PORT FOR MACULAR HOLE;  Surgeon: [name], MD;  Location: [facility] OR;  Service: Ophthalmology;   Laterality: Right;   CATARACT EXTRACTION Bilateral    2013   DILATION AND CURETTAGE OF UTERUS     EYE SURGERY Bilateral 2013   Cat Sx   GAS/FLUID EXCHANGE Right 04/07/2019   Procedure: Gas/Fluid Exchange;  Surgeon: [name], MD;  Location: [facility] OR;  Service: Ophthalmology;  Laterality: Right;   MEMBRANE PEEL Right 04/07/2019   Procedure: Ottie Booty;  Surgeon: [name], MD;  Location: [facility] OR;  Service: Ophthalmology;  Laterality: Right;   PHOTOCOAGULATION WITH LASER Right 04/07/2019   Procedure: Photocoagulation With Laser;  Surgeon: [name], MD;  Location: [facility] OR;  Service: Ophthalmology;  Laterality: Right;   YAG LASER APPLICATION Bilateral    FAMILY HISTORY Family History  Problem Relation Age of Onset   Stroke Father    SOCIAL HISTORY Social History   Tobacco Use   Smoking status: Never   Smokeless tobacco: Never  Vaping Use   Vaping status: Never Used  Substance Use Topics   Alcohol use: No   Drug use: No       OPHTHALMIC EXAM: Base Eye Exam     Visual Acuity (Snellen - Linear)       Right Left   Dist [place] 20/100 +1 20/70 +2   Dist ph [place] 20/70 -2 20/40 -2         Tonometry (Tonopen, 2:01 PM)       Right Left   Pressure 12 12         Pupils       Pupils Dark Light Shape React APD   Right PERRL 3 2 Round Minimal None   Left PERRL 3 2 Round Minimal None         Visual Fields       Left Right    Full Full         Extraocular Movement       Right Left    Full, Ortho Full, Ortho         Neuro/Psych     Oriented x3: Yes   Mood/Affect: Normal         Dilation     Both eyes: 1.0% Mydriacyl , 2.5% Phenylephrine  @ 2:02 PM           Slit Lamp and Fundus Exam     Slit Lamp Exam       Right Left   Lids/Lashes Dermatochalasis - upper lid, periorbital edema Dermatochalasis - upper lid   Conjunctiva/Sclera White and quiet White and quiet   Cornea Arcus, 1+ fine Punctate epithelial erosions, mild endo pigment Arcus, 1-2+  inferior Punctate epithelial erosions, mild tear film debris, mild endo pigment, decreased TBUT   Anterior Chamber Deep, quiet Deep and quiet   Iris Round and dilated Round and dilated   Lens PC IOL in good postion with anterior capsular phimosis, 1+ Posterior capsular opacification PC IOL in good postion with anterior capsular phimosis, 1+ Posterior capsular opacification sparing center   Anterior Vitreous  post vitrectomy Vitreous syneresis         Fundus Exam       Right Left   Disc Sharp rim, mild pallor, peripapillary drusen, PPP, +cupping Pink and Sharp, central pallor, +cupping, +PPP, peripapillary drusen   C/D Ratio 0.65 0.75   Macula flat; ERM/VMT gone, blunted foveal reflex, refractile drusen, trace cystic changes -- stably improved, pigment clumping, focal IRH centrally - stably resolved, scattered atrophy, no heme Blunted foveal reflex, refractile Drusen, early Atrophy, RPE mottling and clumping, punctate CNV / IRH nasal macula with cystic changes -- stably improved, no heme   Vessels attenuated, Tortuous attenuated, Tortuous   Periphery Attached, scattered peripheral drusen, peripheral laser changes superiorly Attached with peripheral drusen           IMAGING AND PROCEDURES  Imaging and Procedures for 02/09/18  OCT, Retina - OU - Both Eyes       Right Eye Quality was good. Central Foveal Thickness: 216. Progression has been stable. Findings include no IRF, no SRF, abnormal foveal contour, retinal drusen , subretinal hyper-reflective material, intraretinal hyper-reflective material, pigment epithelial detachment, outer retinal atrophy (stable improvement in IRF and SRHM temporal macula, stable improvement in IRF / PED IN macula, no new fluid).   Left Eye Quality was good. Central Foveal Thickness: 212. Progression has been stable. Findings include no SRF, abnormal foveal contour, retinal drusen , subretinal hyper-reflective material, epiretinal membrane, intraretinal  fluid, pigment epithelial detachment, vitreous traction, outer retinal atrophy (stable improvement in IRF / PED nasal macula, persistent VMT w/ cystic changes -- stably improved, low PED / [facility] inferior macula).   Notes Images taken, stored on drive  Diagnosis / Impression:  OD: Exudative ARMD -- stable improvement in IRF and SRHM temporal macula, stable improvement in IRF / PED IN macula, no new fluid OS: exudative ARMD -- stable improvement in IRF / PED nasal macula, persistent VMT w/ cystic changes -- stably improved, low PED / [facility] inferior macula  Clinical management:  See below  Abbreviations: NFP - Normal foveal profile. CME - cystoid macular edema. PED - pigment epithelial detachment. IRF - intraretinal fluid. SRF - subretinal fluid. EZ - ellipsoid zone. ERM - epiretinal membrane. ORA - outer retinal atrophy. ORT - outer retinal tubulation. SRHM - subretinal hyper-reflective material       Intravitreal Injection, Pharmacologic Agent - OD - Right Eye       Time Out 09/13/2024. 2:54 PM. Confirmed correct patient, procedure, site, and patient consented.   Anesthesia Topical anesthesia was used. Anesthetic medications included Lidocaine  2%, Proparacaine  0.5%.   Procedure Preparation included 5% betadine to ocular surface, eyelid speculum. A (32g) needle was used.   Injection: 1.25 mg Bevacizumab  1.25mg /0.66ml   Route: Intravitreal, Site: Right Eye   NDC: C2662926, Lot: [id number], Expiration date: 12/03/2024   Post-op Post injection exam found visual acuity of at least counting fingers. The patient tolerated the procedure well. There were no complications. The patient received written and verbal post procedure care education. Post injection medications were not given.      Intravitreal Injection, Pharmacologic Agent - OS - Left Eye       Time Out 09/13/2024. 2:54 PM. Confirmed correct patient, procedure, site, and patient consented.   Anesthesia Topical anesthesia was  used. Anesthetic medications included Lidocaine  2%, Proparacaine  0.5%.   Procedure Preparation included 5% betadine to ocular surface, eyelid speculum. A supplied (32g) needle was used.   Injection: 1.25 mg Bevacizumab  1.25mg /0.55ml   Route: Intravitreal, Site: Left Eye  NDC: 49757-939-98, Lot: [id number]$ , Expiration date: 10/13/2024   Post-op Post injection exam found visual acuity of at least counting fingers. The patient tolerated the procedure well. There were no complications. The patient received written and verbal post procedure care education. Post injection medications were not given.             ASSESSMENT/PLAN:    ICD-10-CM   1. Exudative age-related macular degeneration of right eye with active choroidal neovascularization (HCC)  H35.3211 OCT, Retina - OU - Both Eyes    Intravitreal Injection, Pharmacologic Agent - OD - Right Eye    Bevacizumab  (AVASTIN ) SOLN 1.25 mg    2. Exudative age-related macular degeneration of left eye with active choroidal neovascularization (HCC)  H35.3221 Intravitreal Injection, Pharmacologic Agent - OS - Left Eye    Bevacizumab  (AVASTIN ) SOLN 1.25 mg    3. 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 Pseudophakia of both eyes  Z96.1       1. 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  - S/P IVA OD #1 (12.03.18), #2 (01.02.19), #3 (01.30.19), #4 (03.01.19), #5 (04.01.19), #6 (04.30.19), #7 (06.05.19), #8 (07.19.19), #9 (08.30.19), #10 (10.14.19), #11 (11.18.19), #12 (12.30.19), #13 (02.10.20), #14 (04.06.20), #15 (09.17.20), #16 (10.15.20), #17 (12.01.20), #18 (01.18.21), #19 (03.15.21), #20 (5.11.21), #21 (07.20.21), #22 (10.14.21), #23 (01.10.22), #24 (03.07.22), #24 (05.23.22), #25 (9.1.22), #26 (11.01.22), #27 (12.28.22), # 28 (02.14.23), #29 (03.28.23), #30 (01.20.25), #31 (03.10.25), #32 (04.28.25), #33 (06.16.25), #34 (08.05.25)  ===============  - s/p IVE OD #1 (05.09.23) sample, #2  (06.14.23), #3 (07.12.23), #4 (08.22.23), #5 (10.03.23)., #6 (11.21.23), #7 (01.09.24), #8 (02.27.24), #9 (04.23.24), #10 (06.17.24), #11 (08.12.24), #12 (09.09.24), #13 (10.15.24), #14 (11.25.24)  - FA (04.01.19) shows mild CNVM OD consistent with exudative ARMD  - OCT shows OD: stable improvement in IRF and SRHM temporal macula, stable improvement in IRF / PED IN macula, no new fluid at 9 weeks  - BCVA OD 20/70 down from 20/60  - recommend IVA OD #35 today, 09.30.25 w/ f/u ext to 10 wks  - pt wishes to proceed with Avastin   - RBA of procedure discussed, questions answered  - see procedure note  - Avastin  informed consent form signed and scanned on 01.20.25 (OU)  - f/u 10 weeks -- DFE/OCT/possible injection  2. Age related macular degeneration, exudative, OS  - interval conversion from non-exudative to exudative noted 07.20.21  - s/p IVA OS #1 (07.20.21), #2 (08.19.21), #3 (09.16.21), #4 (10.14.21), #5 (11.29.21), #6 (01.10.22), #7 (03.07.22), #8 (05.23.22), #9 (09.01.22), #10 (03.28.23), #11 (05.09.23), #12 (06.14.23), #13 (07.12.23), #14 (08.22.23), #15 (10.03.23), #16 (11.21.23), #17 (01.09.24), #18 (02.27.24), #19 (01.20.25), #20 (03.10.25), #21 (04.28.25), #22 (06.16.25), #23 (08.05.25), #24 (09.30.25)  =================  - s/p IVE OS #1 (08.12.24), #2 (09.09.24), #3 (10.15.24), #4 (11.25.24)  - exam shows no heme  - OCT with OS: stable improvement in IRF / PED nasal macula, persistent VMT w/ cystic changes -- stably improved, low PED / [facility] inferior macula at 9 wks  - BCVA OS 20/40 improved from 20/50  - recommend IVA OS #25 today, 12.02.25 w/ f/u ext to 10 wks  - pt wishes to proceed with injection  - RBA of procedure discussed, questions answered - IVA informed consent obtained and signed, 01.20.25 (OU) - see procedure note - f/u 10 weeks DFE, OCT  3. 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 Pseudophakia OU  - s/p CE/IOL OU  - s/p YAG OU  - beautiful surgeries, doing well  - monitor  Ophthalmic Meds Ordered this visit:  Meds ordered this encounter  Medications   Bevacizumab  (AVASTIN ) SOLN 1.25 mg   Bevacizumab  (AVASTIN ) SOLN 1.25 mg     Return in about 10 weeks (around 11/22/2024) for exu ARMD OU, DFE, OCT, Possible Injxn.  There are no Patient Instructions on file for this visit.  This document serves as a record of services personally performed by Redell JUDITHANN Hans, MD, PhD. It was created on their behalf by Wanda GEANNIE Keens, COT an ophthalmic technician. The creation of this record is the provider's dictation and/or activities during the visit.    Electronically signed by:  Wanda GEANNIE Keens, COT  09/14/24 3:15 AM  This document serves as a record of services personally performed by Redell JUDITHANN Hans, MD, PhD. It was created on their behalf by Almetta Pesa, an ophthalmic technician. The creation of this record is the provider's dictation and/or activities during the visit.    Electronically signed by: Almetta Pesa, OA, 09/14/24  3:15 AM   Redell JUDITHANN Hans, M.D., Ph.D. Diseases & Surgery of the Retina and Vitreous [facility]  I have reviewed the above documentation for accuracy and completeness, and I agree with the above. Redell JUDITHANN Hans, M.D., Ph.D. 09/14/24 3:15 AM   Abbreviations: M myopia (nearsighted); A astigmatism; H hyperopia (farsighted); P presbyopia; Mrx spectacle prescription;  CTL contact lenses; OD right eye; OS left eye; OU both eyes  XT exotropia; ET esotropia; PEK punctate epithelial keratitis; PEE punctate epithelial erosions; DES dry eye syndrome; MGD  meibomian gland dysfunction; ATs artificial tears; PFAT's preservative free artificial tears; NSC nuclear sclerotic cataract; PSC posterior subcapsular cataract; ERM epi-retinal membrane; PVD posterior vitreous detachment; RD retinal detachment; DM diabetes mellitus; DR diabetic retinopathy; NPDR non-proliferative diabetic retinopathy; PDR proliferative diabetic retinopathy; CSME clinically significant macular edema; DME diabetic macular edema; dbh dot blot hemorrhages; CWS cotton wool spot; POAG primary open angle glaucoma; C/D cup-to-disc ratio; HVF humphrey visual field; GVF goldmann visual field; OCT optical coherence tomography; IOP intraocular pressure; BRVO Branch retinal vein occlusion; CRVO central retinal vein occlusion; CRAO central retinal artery occlusion; BRAO branch retinal artery occlusion; RT retinal tear; SB scleral buckle; PPV pars plana vitrectomy; VH Vitreous hemorrhage; PRP panretinal laser photocoagulation; IVK intravitreal kenalog ; VMT vitreomacular traction; MH Macular hole;  NVD neovascularization of the disc; NVE neovascularization elsewhere; AREDS age related eye disease study; ARMD age related macular degeneration; POAG primary open angle glaucoma; EBMD epithelial/anterior basement membrane dystrophy; ACIOL anterior chamber intraocular lens; IOL intraocular lens; PCIOL posterior chamber intraocular lens; Phaco/IOL phacoemulsification with intraocular lens placement; PRK photorefractive keratectomy; LASIK laser assisted in situ keratomileusis; HTN hypertension; DM diabetes mellitus; COPD chronic obstructive pulmonary disease

## 2024-09-13 ENCOUNTER — Ambulatory Visit (INDEPENDENT_AMBULATORY_CARE_PROVIDER_SITE_OTHER): Admitting: Ophthalmology

## 2024-09-13 ENCOUNTER — Encounter (INDEPENDENT_AMBULATORY_CARE_PROVIDER_SITE_OTHER): Payer: Self-pay | Admitting: Ophthalmology

## 2024-09-13 DIAGNOSIS — H353211 Exudative age-related macular degeneration, right eye, with active choroidal neovascularization: Secondary | ICD-10-CM

## 2024-09-13 DIAGNOSIS — H43823 Vitreomacular adhesion, bilateral: Secondary | ICD-10-CM

## 2024-09-13 DIAGNOSIS — Z961 Presence of intraocular lens: Secondary | ICD-10-CM

## 2024-09-13 DIAGNOSIS — H353221 Exudative age-related macular degeneration, left eye, with active choroidal neovascularization: Secondary | ICD-10-CM

## 2024-09-13 DIAGNOSIS — H353231 Exudative age-related macular degeneration, bilateral, with active choroidal neovascularization: Secondary | ICD-10-CM

## 2024-09-13 MED ORDER — BEVACIZUMAB CHEMO INJECTION 1.25MG/0.05ML SYRINGE FOR KALEIDOSCOPE
1.2500 mg | INTRAVITREAL | Status: AC | PRN
  Administered 2024-09-13: 1.25 mg via INTRAVITREAL

## 2024-11-08 NOTE — Progress Notes (Shared)
 " [facility] - Clinic Note  11/22/2024     CHIEF COMPLAINT Patient presents for No chief complaint on file.    HISTORY OF PRESENT ILLNESS: Lindsay Harvey is a 89 y.o. female who presents to the clinic today for:     Pt states vision is doing well, happy with the Ats she's been using. Dryness has improved.   Referring physician: [name], MD [id number] [place],  [place] [address]  HISTORICAL INFORMATION:   Selected notes from the MEDICAL RECORD NUMBER Referred by Dr. KYM Ferrier for concern of AMD OU, CNV   CURRENT MEDICATIONS: Current Outpatient Medications (Ophthalmic Drugs)  Medication Sig   carboxymethylcellulose (REFRESH PLUS) 0.5 % SOLN Place 1 drop into both eyes 3 (three) times daily as needed (dry/irritated eyes).    prednisoLONE  acetate (PRED FORTE ) 1 % ophthalmic suspension Place 1 drop into the right eye 4 (four) times daily.   REFRESH OPTIVE MEGA-3 0.5-1-0.5 % SOLN Place 1 drop into both eyes 3 (three) times daily as needed (discomfort/eye irritation.).   No current facility-administered medications for this visit. (Ophthalmic Drugs)   Current Outpatient Medications (Other)  Medication Sig   amLODipine  (NORVASC ) 5 MG tablet TAKE 1 TABLET BY MOUTH DAILY   aspirin  EC 81 MG tablet Take 1 tablet (81 mg total) by mouth daily. Swallow whole.   calcium carbonate (OSCAL) 1500 (600 Ca) MG TABS tablet Take 600 mg of elemental calcium by mouth 2 (two) times a day.   Cholecalciferol (VITAMIN D3) 50 MCG (2000 UT) TABS Take 2,000 Units by mouth daily.   nitroGLYCERIN  (NITROSTAT ) 0.4 MG SL tablet Place 1 tablet (0.4 mg total) under the tongue every 5 (five) minutes as needed for chest pain. (Patient not taking: Reported on 03/28/2024)   simvastatin (ZOCOR) 40 MG tablet Take 20 mg by mouth every evening. 1/4 tablet at night   Current Facility-Administered Medications (Other)  Medication Route   Bevacizumab  (AVASTIN ) SOLN 1.25 mg Intravitreal   Bevacizumab   (AVASTIN ) SOLN 1.25 mg Intravitreal   Bevacizumab  (AVASTIN ) SOLN 1.25 mg Intravitreal   Bevacizumab  (AVASTIN ) SOLN 1.25 mg Intravitreal   Bevacizumab  (AVASTIN ) SOLN 1.25 mg Intravitreal   Bevacizumab  (AVASTIN ) SOLN 1.25 mg Intravitreal   Bevacizumab  (AVASTIN ) SOLN 1.25 mg Intravitreal   Bevacizumab  (AVASTIN ) SOLN 1.25 mg Intravitreal   Bevacizumab  (AVASTIN ) SOLN 1.25 mg Intravitreal   Bevacizumab  (AVASTIN ) SOLN 1.25 mg Intravitreal   Bevacizumab  (AVASTIN ) SOLN 1.25 mg Intravitreal   Bevacizumab  (AVASTIN ) SOLN 1.25 mg Intravitreal   Bevacizumab  (AVASTIN ) SOLN 1.25 mg Intravitreal   REVIEW OF SYSTEMS:    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   Headache    Hypercholesterolemia    Macular degeneration    Wet OU   Wears glasses    Past Surgical History:  Procedure Laterality Date   25 GAUGE PARS PLANA VITRECTOMY WITH 20 GAUGE MVR PORT FOR MACULAR HOLE Right 04/07/2019   Procedure: 25 GAUGE PARS PLANA VITRECTOMY WITH 20 GAUGE MVR PORT FOR MACULAR HOLE;  Surgeon: [name], MD;  Location: [facility] OR;  Service: Ophthalmology;  Laterality: Right;   CATARACT EXTRACTION Bilateral    2013   DILATION AND CURETTAGE OF UTERUS     EYE SURGERY Bilateral 2013   Cat Sx   GAS/FLUID EXCHANGE Right 04/07/2019   Procedure: Gas/Fluid Exchange;  Surgeon: Valdemar,  Redell, MD;  Location: [facility] OR;  Service: Ophthalmology;  Laterality: Right;   MEMBRANE PEEL Right 04/07/2019   Procedure: Ottie Booty;  Surgeon: Valdemar Redell, MD;  Location: [facility] OR;  Service: Ophthalmology;  Laterality: Right;   PHOTOCOAGULATION WITH LASER Right 04/07/2019   Procedure: Photocoagulation With Laser;  Surgeon: Valdemar Redell, MD;  Location: [facility] OR;  Service: Ophthalmology;  Laterality: Right;   YAG LASER APPLICATION Bilateral    FAMILY HISTORY Family History  Problem Relation Age of Onset    Stroke Father    SOCIAL HISTORY Social History   Tobacco Use   Smoking status: Never   Smokeless tobacco: Never  Vaping Use   Vaping status: Never Used  Substance Use Topics   Alcohol use: No   Drug use: No       OPHTHALMIC EXAM: Not recorded    IMAGING AND PROCEDURES  Imaging and Procedures for 02/09/18           ASSESSMENT/PLAN:  No diagnosis found.   1. 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  - S/P IVA OD #1 (12.03.18), #2 (01.02.19), #3 (01.30.19), #4 (03.01.19), #5 (04.01.19), #6 (04.30.19), #7 (06.05.19), #8 (07.19.19), #9 (08.30.19), #10 (10.14.19), #11 (11.18.19), #12 (12.30.19), #13 (02.10.20), #14 (04.06.20), #15 (09.17.20), #16 (10.15.20), #17 (12.01.20), #18 (01.18.21), #19 (03.15.21), #20 (5.11.21), #21 (07.20.21), #22 (10.14.21), #23 (01.10.22), #24 (03.07.22), #24 (05.23.22), #25 (9.1.22), #26 (11.01.22), #27 (12.28.22), # 28 (02.14.23), #29 (03.28.23), #30 (01.20.25), #31 (03.10.25), #32 (04.28.25), #33 (06.16.25), #34 (08.05.25), #35 (09.30.25)  ===============  - s/p IVE OD #1 (05.09.23) sample, #2 (06.14.23), #3 (07.12.23), #4 (08.22.23), #5 (10.03.23)., #6 (11.21.23), #7 (01.09.24), #8 (02.27.24), #9 (04.23.24), #10 (06.17.24), #11 (08.12.24), #12 (09.09.24), #13 (10.15.24), #14 (11.25.24)  - FA (04.01.19) shows mild CNVM OD consistent with exudative ARMD  - OCT shows OD: stable improvement in IRF and SRHM temporal macula, stable improvement in IRF / PED IN macula, no new fluid at 9 weeks  - BCVA OD 20/70 down from 20/60  - recommend IVA OD #36 today, 02.10.26 w/ f/u ext to 10 wks  - pt wishes to proceed with Avastin   - RBA of procedure discussed, questions answered  - see procedure note  - Avastin  informed consent form signed and scanned on 01.20.25 (OU)  - f/u 10 weeks -- DFE/OCT/possible injection  2. Age related macular degeneration, exudative, OS  - interval conversion from non-exudative to  exudative noted 07.20.21  - s/p IVA OS #1 (07.20.21), #2 (08.19.21), #3 (09.16.21), #4 (10.14.21), #5 (11.29.21), #6 (01.10.22), #7 (03.07.22), #8 (05.23.22), #9 (09.01.22), #10 (03.28.23), #11 (05.09.23), #12 (06.14.23), #13 (07.12.23), #14 (08.22.23), #15 (10.03.23), #16 (11.21.23), #17 (01.09.24), #18 (02.27.24), #19 (01.20.25), #20 (03.10.25), #21 (04.28.25), #22 (06.16.25), #23 (08.05.25), #24 (09.30.25)  =================  - s/p IVE OS #1 (08.12.24), #2 (09.09.24), #3 (10.15.24), #4 (11.25.24), #5 (12.02.25)  - exam shows no heme  - OCT with OS: stable improvement in IRF / PED nasal macula, persistent VMT w/ cystic changes -- stably improved, low PED / [facility] inferior macula at 9 wks  - BCVA OS 20/40 improved from 20/50  - recommend IVA OS #26 today, 02.10.26 w/ f/u ext to 10 wks  - pt wishes to proceed with injection  - RBA of procedure discussed, questions answered - IVA informed consent obtained and signed, 01.20.25 (OU) - see procedure note - f/u 10 weeks DFE, OCT  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 Pseudophakia OU  - s/p CE/IOL OU  - s/p YAG OU  - beautiful surgeries, doing well  - monitor  Ophthalmic Meds Ordered this visit:  No orders of the defined types were placed in this encounter.    No follow-ups on file.  There are no Patient Instructions on file for this visit.  This document serves as a record of services personally performed by Redell JUDITHANN Hans, MD, PhD. It was created on their behalf by Wanda GEANNIE Keens, COT an ophthalmic technician. The creation of this record is the provider's dictation and/or activities  during the visit.    Electronically signed by:  Wanda GEANNIE Keens, COT  11/08/24 10:02 AM   Redell JUDITHANN Hans, M.D., Ph.D. Diseases & Surgery of the Retina and Vitreous [facility]  Abbreviations: M myopia (nearsighted); A astigmatism; H hyperopia (farsighted); P presbyopia; Mrx spectacle prescription;  CTL contact lenses; OD right eye; OS left eye; OU both eyes  XT exotropia; ET esotropia; PEK punctate epithelial keratitis; PEE punctate epithelial erosions; DES dry eye syndrome; MGD meibomian gland dysfunction; ATs artificial tears; PFAT's preservative free artificial tears; NSC nuclear sclerotic cataract; PSC posterior subcapsular cataract; ERM epi-retinal membrane; PVD posterior vitreous detachment; RD retinal detachment; DM diabetes mellitus; DR diabetic retinopathy; NPDR non-proliferative diabetic retinopathy; PDR proliferative diabetic retinopathy; CSME clinically significant macular edema; DME diabetic macular edema; dbh dot blot hemorrhages; CWS cotton wool spot; POAG primary open angle glaucoma; C/D cup-to-disc ratio; HVF humphrey visual field; GVF goldmann visual field; OCT optical coherence tomography; IOP intraocular pressure; BRVO Branch retinal vein occlusion; CRVO central retinal vein occlusion; CRAO central retinal artery occlusion; BRAO branch retinal artery occlusion; RT retinal tear; SB scleral buckle; PPV pars plana vitrectomy; VH Vitreous hemorrhage; PRP panretinal laser photocoagulation; IVK intravitreal kenalog ; VMT vitreomacular traction; MH Macular hole;  NVD neovascularization of the disc; NVE neovascularization elsewhere; AREDS age related eye disease study; ARMD age related macular degeneration; POAG primary open angle glaucoma; EBMD epithelial/anterior basement membrane dystrophy; ACIOL anterior chamber intraocular lens; IOL intraocular lens; PCIOL posterior chamber intraocular lens; Phaco/IOL phacoemulsification with intraocular lens placement; PRK  photorefractive keratectomy; LASIK laser assisted in situ keratomileusis; HTN hypertension; DM diabetes mellitus; COPD chronic obstructive pulmonary disease "

## 2024-11-22 ENCOUNTER — Encounter (INDEPENDENT_AMBULATORY_CARE_PROVIDER_SITE_OTHER): Admitting: Ophthalmology

## 2024-11-22 DIAGNOSIS — H353221 Exudative age-related macular degeneration, left eye, with active choroidal neovascularization: Secondary | ICD-10-CM

## 2024-11-22 DIAGNOSIS — Z961 Presence of intraocular lens: Secondary | ICD-10-CM

## 2024-11-22 DIAGNOSIS — H43823 Vitreomacular adhesion, bilateral: Secondary | ICD-10-CM

## 2024-11-22 DIAGNOSIS — H353211 Exudative age-related macular degeneration, right eye, with active choroidal neovascularization: Secondary | ICD-10-CM
